# Patient Record
Sex: Female | Born: 1960 | Race: White | Hispanic: No | Smoking: Never smoker
Health system: Southern US, Community
[De-identification: ages and names within clinical notes are randomized; demographics above are authoritative.]

## PROBLEM LIST (undated history)

## (undated) DIAGNOSIS — K219 Gastro-esophageal reflux disease without esophagitis: Secondary | ICD-10-CM

## (undated) DIAGNOSIS — G8929 Other chronic pain: Secondary | ICD-10-CM

## (undated) DIAGNOSIS — G709 Myoneural disorder, unspecified: Secondary | ICD-10-CM

## (undated) DIAGNOSIS — G4700 Insomnia, unspecified: Secondary | ICD-10-CM

## (undated) DIAGNOSIS — F319 Bipolar disorder, unspecified: Secondary | ICD-10-CM

## (undated) DIAGNOSIS — F32A Depression, unspecified: Secondary | ICD-10-CM

## (undated) DIAGNOSIS — Z79890 Hormone replacement therapy (postmenopausal): Secondary | ICD-10-CM

## (undated) DIAGNOSIS — G43909 Migraine, unspecified, not intractable, without status migrainosus: Secondary | ICD-10-CM

## (undated) DIAGNOSIS — T7840XA Allergy, unspecified, initial encounter: Secondary | ICD-10-CM

## (undated) DIAGNOSIS — M545 Low back pain: Secondary | ICD-10-CM

## (undated) DIAGNOSIS — F419 Anxiety disorder, unspecified: Secondary | ICD-10-CM

## (undated) DIAGNOSIS — M1990 Unspecified osteoarthritis, unspecified site: Principal | ICD-10-CM

## (undated) HISTORY — PX: JOINT REPLACEMENT: SHX530

## (undated) HISTORY — PX: VAGINAL HYSTERECTOMY: SUR661

## (undated) HISTORY — DX: Myoneural disorder, unspecified: G70.9

## (undated) HISTORY — PX: CHOLECYSTECTOMY: SHX55

## (undated) HISTORY — PX: KNEE SURGERY: SHX244

## (undated) HISTORY — DX: Insomnia, unspecified: G47.00

## (undated) HISTORY — DX: Bipolar disorder, unspecified: F31.9

## (undated) HISTORY — DX: Low back pain: M54.5

## (undated) HISTORY — DX: Unspecified osteoarthritis, unspecified site: M19.90

## (undated) HISTORY — DX: Allergy, unspecified, initial encounter: T78.40XA

## (undated) HISTORY — DX: Depression, unspecified: F32.A

## (undated) HISTORY — DX: Migraine, unspecified, not intractable, without status migrainosus: G43.909

## (undated) HISTORY — DX: Other chronic pain: G89.29

## (undated) HISTORY — DX: Hormone replacement therapy: Z79.890

## (undated) HISTORY — DX: Gastro-esophageal reflux disease without esophagitis: K21.9

## (undated) HISTORY — DX: Anxiety disorder, unspecified: F41.9

---

## 2005-02-22 HISTORY — PX: COLONOSCOPY: SHX174

## 2011-07-05 DIAGNOSIS — M545 Low back pain, unspecified: Secondary | ICD-10-CM

## 2011-07-05 DIAGNOSIS — G8929 Other chronic pain: Secondary | ICD-10-CM

## 2011-07-05 HISTORY — DX: Other chronic pain: G89.29

## 2011-07-05 HISTORY — DX: Low back pain, unspecified: M54.50

## 2012-06-12 DIAGNOSIS — N951 Menopausal and female climacteric states: Secondary | ICD-10-CM | POA: Insufficient documentation

## 2012-09-19 DIAGNOSIS — G43909 Migraine, unspecified, not intractable, without status migrainosus: Secondary | ICD-10-CM

## 2012-09-19 HISTORY — DX: Migraine, unspecified, not intractable, without status migrainosus: G43.909

## 2013-03-05 DIAGNOSIS — G4700 Insomnia, unspecified: Secondary | ICD-10-CM

## 2013-03-05 HISTORY — DX: Insomnia, unspecified: G47.00

## 2014-09-23 DIAGNOSIS — M1812 Unilateral primary osteoarthritis of first carpometacarpal joint, left hand: Secondary | ICD-10-CM | POA: Insufficient documentation

## 2014-09-23 DIAGNOSIS — M1811 Unilateral primary osteoarthritis of first carpometacarpal joint, right hand: Secondary | ICD-10-CM | POA: Insufficient documentation

## 2015-07-12 DIAGNOSIS — M1711 Unilateral primary osteoarthritis, right knee: Secondary | ICD-10-CM | POA: Insufficient documentation

## 2015-08-20 DIAGNOSIS — Z79890 Hormone replacement therapy (postmenopausal): Secondary | ICD-10-CM | POA: Insufficient documentation

## 2015-08-20 DIAGNOSIS — J301 Allergic rhinitis due to pollen: Secondary | ICD-10-CM | POA: Insufficient documentation

## 2015-08-20 DIAGNOSIS — M2550 Pain in unspecified joint: Secondary | ICD-10-CM | POA: Insufficient documentation

## 2015-08-20 DIAGNOSIS — K219 Gastro-esophageal reflux disease without esophagitis: Secondary | ICD-10-CM

## 2015-08-20 DIAGNOSIS — F319 Bipolar disorder, unspecified: Secondary | ICD-10-CM | POA: Insufficient documentation

## 2015-08-20 HISTORY — DX: Hormone replacement therapy: Z79.890

## 2015-08-20 HISTORY — DX: Gastro-esophageal reflux disease without esophagitis: K21.9

## 2015-08-20 HISTORY — DX: Bipolar disorder, unspecified: F31.9

## 2015-10-29 DIAGNOSIS — S83249A Other tear of medial meniscus, current injury, unspecified knee, initial encounter: Secondary | ICD-10-CM | POA: Insufficient documentation

## 2015-11-11 DIAGNOSIS — Z9889 Other specified postprocedural states: Secondary | ICD-10-CM | POA: Insufficient documentation

## 2016-08-16 DIAGNOSIS — M722 Plantar fascial fibromatosis: Secondary | ICD-10-CM | POA: Insufficient documentation

## 2017-03-08 ENCOUNTER — Encounter: Payer: Self-pay | Admitting: Osteopathic Medicine

## 2017-03-08 ENCOUNTER — Encounter (INDEPENDENT_AMBULATORY_CARE_PROVIDER_SITE_OTHER): Payer: Self-pay

## 2017-03-08 ENCOUNTER — Ambulatory Visit (INDEPENDENT_AMBULATORY_CARE_PROVIDER_SITE_OTHER): Payer: Medicare HMO | Admitting: Osteopathic Medicine

## 2017-03-08 VITALS — BP 137/86 | HR 65 | Temp 98.1°F | Ht 64.0 in | Wt 179.0 lb

## 2017-03-08 DIAGNOSIS — F5101 Primary insomnia: Secondary | ICD-10-CM | POA: Diagnosis not present

## 2017-03-08 DIAGNOSIS — G8929 Other chronic pain: Secondary | ICD-10-CM

## 2017-03-08 DIAGNOSIS — Z79890 Hormone replacement therapy (postmenopausal): Secondary | ICD-10-CM | POA: Diagnosis not present

## 2017-03-08 DIAGNOSIS — M545 Low back pain: Secondary | ICD-10-CM | POA: Diagnosis not present

## 2017-03-08 DIAGNOSIS — Z1231 Encounter for screening mammogram for malignant neoplasm of breast: Secondary | ICD-10-CM | POA: Diagnosis not present

## 2017-03-08 DIAGNOSIS — N951 Menopausal and female climacteric states: Secondary | ICD-10-CM | POA: Diagnosis not present

## 2017-03-08 DIAGNOSIS — M1990 Unspecified osteoarthritis, unspecified site: Secondary | ICD-10-CM | POA: Diagnosis not present

## 2017-03-08 DIAGNOSIS — K219 Gastro-esophageal reflux disease without esophagitis: Secondary | ICD-10-CM | POA: Diagnosis not present

## 2017-03-08 DIAGNOSIS — Z780 Asymptomatic menopausal state: Secondary | ICD-10-CM | POA: Diagnosis not present

## 2017-03-08 DIAGNOSIS — J302 Other seasonal allergic rhinitis: Secondary | ICD-10-CM | POA: Diagnosis not present

## 2017-03-08 DIAGNOSIS — Z0000 Encounter for general adult medical examination without abnormal findings: Secondary | ICD-10-CM

## 2017-03-08 DIAGNOSIS — F319 Bipolar disorder, unspecified: Secondary | ICD-10-CM

## 2017-03-08 DIAGNOSIS — F419 Anxiety disorder, unspecified: Secondary | ICD-10-CM | POA: Diagnosis not present

## 2017-03-08 DIAGNOSIS — Z1239 Encounter for other screening for malignant neoplasm of breast: Secondary | ICD-10-CM

## 2017-03-08 HISTORY — DX: Unspecified osteoarthritis, unspecified site: M19.90

## 2017-03-08 HISTORY — DX: Anxiety disorder, unspecified: F41.9

## 2017-03-08 MED ORDER — DICLOFENAC SODIUM 1 % TD GEL: 2.0000 g | Freq: Four times a day (QID) | TRANSDERMAL | 11 refills | Status: DC

## 2017-03-08 MED ORDER — ESTRADIOL 1 MG PO TABS: 1.0000 mg | ORAL_TABLET | Freq: Every day | ORAL | 1 refills | Status: DC

## 2017-03-08 MED ORDER — CELECOXIB 200 MG PO CAPS: 200.0000 mg | ORAL_CAPSULE | Freq: Two times a day (BID) | ORAL | 3 refills | Status: DC

## 2017-03-08 NOTE — Progress Notes (Signed)
HPI: Amber Hanson is a 57 y.o. female who  has a past medical history of Anxiety (03/08/2017), Arthritis (03/08/2017), Bipolar 1 disorder (HCC) (08/20/2015), Chronic low back pain (07/05/2011), Gastroesophageal reflux disease without esophagitis (08/20/2015), Insomnia (03/05/2013), Migraine without status migrainosus, not intractable (09/19/2012), and Postmenopausal HRT (hormone replacement therapy) (08/20/2015).  she presents to [facility] today, 03/08/17,  for chief complaint of:  Chief Complaint  Patient presents with  . Establish Care - multiple issues to discuss as below    Menopausal symptoms:  has been off HRT for some time, hot flashes, would like to get back on this. That she was on estrogen/progesterone combination. She has had a hysterectomy.  Psychiatric:  Following with psychiatry for anxiety/bipolar. Reports doing well on current medication regimen as below. Most recent psychiatry visit 02/24/2017. Alfonso Patten was ordered at that visit.  Knee pain:  Celebrex was helping, then dose was reduced. Would like to get back on 400 mg per day dosing. Was previously following with orthopedics. Has been sometime since last visit  Back pain: Records reviewed from previous PCP, [facility] in [place], Dr. Jean Rosenthal. Last office visit October 2018. At that point chief complaint back pain ongoing for more than 5 years but worse over the past few weeks at that time. At that time, was endorsing periodic numbness of bilateral upper extremities. Cervical spine x-ray was performed, steroid taper added to her current anti-inflammatories. Was advised f/u in 6 weeks. Patient states the steroids were helping, she never got the x-rays done    Past medical, surgical, social and family history reviewed:  Patient Active Problem List   Diagnosis Date Noted  . Anxiety 03/08/2017  . Arthritis 03/08/2017  . Plantar fasciitis, left 08/16/2016  . S/P left knee arthroscopy  11/11/2015  . Acute medial meniscal tear 10/29/2015  . Allergic rhinitis due to pollen 08/20/2015  . Bipolar 1 disorder (HCC) 08/20/2015  . Gastroesophageal reflux disease without esophagitis 08/20/2015  . Multiple joint pain 08/20/2015  . Postmenopausal HRT (hormone replacement therapy) 08/20/2015  . Primary osteoarthritis of right knee 07/12/2015  . Primary osteoarthritis of first carpometacarpal joint of left hand 09/23/2014  . Insomnia 03/05/2013  . Migraine without status migrainosus, not intractable 09/19/2012  . Menopausal symptoms 06/12/2012  . Chronic low back pain 07/05/2011    Past Surgical History:  Procedure Laterality Date  . CESAREAN SECTION     (2)   . CHOLECYSTECTOMY    . KNEE SURGERY Bilateral   . VAGINAL HYSTERECTOMY      Social History   Tobacco Use  . Smoking status: Never Smoker  . Smokeless tobacco: Never Used  Substance Use Topics  . Alcohol use: No    Frequency: Never    Family History  Problem Relation Age of Onset  . Cancer Mother   . Depression Father   . Diabetes Father   . Depression Sister   . Depression Brother   . Stroke Other   . GER disease Other      Current medication list and allergy/intolerance information reviewed:    Current Meds  Medication Sig  . busPIRone (BUSPAR) 15 MG tablet   . celecoxib (CELEBREX) 200 MG capsule Take 1 capsule (200 mg total) by mouth 2 (two) times daily.  . cetirizine (ZYRTEC) 10 MG tablet Take by mouth.  . DULoxetine (CYMBALTA) 60 MG capsule   . Eszopiclone 3 MG TABS Take by mouth.  . Misc. Devices MISC Tri-Est SR 8:1:1 with methyltestosterone 0.625/1.25mg   .  pantoprazole (PROTONIX) 20 MG tablet Take by mouth.  . phenylephrine (SUDAFED PE) 10 MG TABS tablet   . propranolol (INDERAL) 20 MG tablet TAKE 1 TABLET TWICE DAILY  . zolpidem (AMBIEN) 10 MG tablet   . [DISCONTINUED] celecoxib (CELEBREX) 200 MG capsule   . [DISCONTINUED] estrogens-methylTEST (ESTRATEST) 1.25-2.5 MG tablet Take by mouth.     Allergies  Allergen Reactions  . Other Itching, Nausea And Vomiting and Other (See Comments)    Patient states she has taken this safely before since the one episode of itching  Anti-inflammatory - affects liver function Patient states she has taken this safely before since the one episode of itching  Anti-inflammatory - affects liver function   . Red Dye Itching    Patient states she has taken this safely before since the one episode of itching   . Sulfa Antibiotics Anaphylaxis  . Sulfasalazine Anaphylaxis and Swelling  . Tramadol Nausea And Vomiting and Other (See Comments)    Headaches Headaches Headaches   . Ibuprofen Rash    Affects liver functions      Review of Systems:  Constitutional:  No  fever, no chills, No recent illness, No unintentional weight changes. No significant fatigue.   HEENT: No  headache, no vision change, no hearing change, No sore throat, No  sinus pressure  Cardiac: No  chest pain, No  pressure, No palpitations, No  Orthopnea  Respiratory:  No  shortness of breath. No  Cough  Gastrointestinal: No  abdominal pain, No  nausea, No  vomiting,  No  blood in stool, No  diarrhea, No  constipation   Musculoskeletal: No new myalgia/arthralgia  Genitourinary: No  incontinence, No  abnormal genital bleeding, No abnormal genital discharge  Skin: No  Rash, No other wounds/concerning lesions  Hem/Onc: No  easy bruising/bleeding, No  abnormal lymph node  Endocrine: No cold intolerance,  No heat intolerance. No polyuria/polydipsia/polyphagia   Neurologic: No  weakness, No  dizziness, No  slurred speech/focal weakness/facial droop  Psychiatric: No  concerns with depression, No  concerns with anxiety, No sleep problems, No mood problems  Exam:  BP 137/86   Pulse 65   Temp 98.1 F (36.7 C) (Oral)   Ht 5\' 4"  (1.626 m)   Wt 179 lb (81.2 kg)   LMP  (LMP Unknown)   BMI 30.73 kg/m   Constitutional: VS see above. General Appearance: alert,  well-developed, well-nourished, NAD  Eyes: Normal lids and conjunctive, non-icteric sclera  Ears, Nose, Mouth, Throat: MMM, Normal external inspection ears/nares/mouth/lips/gums. TM normal bilaterally. Pharynx/tonsils no erythema, no exudate. Nasal mucosa normal.   Neck: No masses, trachea midline. No thyroid enlargement. No tenderness/mass appreciated. No lymphadenopathy  Respiratory: Normal respiratory effort. no wheeze, no rhonchi, no rales  Cardiovascular: S1/S2 normal, no murmur, no rub/gallop auscultated. RRR. No lower extremity edema. Pedal pulse II/IV bilaterally DP and PT. No carotid bruit or JVD. No abdominal aortic bruit.  Gastrointestinal: Nontender, no masses. No hepatomegaly, no splenomegaly. No hernia appreciated. Bowel sounds normal. Rectal exam deferred.   Musculoskeletal: Gait normal. No clubbing/cyanosis of digits.   Neurological: Normal balance/coordination. No tremor. No cranial nerve deficit on limited exam. Motor and sensation intact and symmetric. Cerebellar reflexes intact.   Skin: warm, dry, intact. No rash/ulcer. No concerning nevi or subq nodules on limited exam.    Psychiatric: Normal judgment/insight. Normal mood and affect. Oriented x3.    No results found for this or any previous visit (from the past 72 hour(s)).  No results found.  ASSESSMENT/PLAN:   Arthritis - Plan: diclofenac sodium (VOLTAREN) 1 % GEL, celecoxib (CELEBREX) 200 MG capsule  Postmenopausal HRT (hormone replacement therapy) - Plan: estradiol (ESTRACE) 1 MG tablet  Postmenopausal - Plan: estradiol (ESTRACE) 1 MG tablet, VITAMIN D 25 Hydroxy (Vit-D Deficiency, Fractures)  Breast cancer screening - Plan: MM DIGITAL SCREENING BILATERAL  Anxiety - Plan: busPIRone (BUSPAR) 15 MG tablet, DULoxetine (CYMBALTA) 60 MG capsule, propranolol (INDERAL) 20 MG tablet  Bipolar 1 disorder (HCC) - Plan: busPIRone (BUSPAR) 15 MG tablet, DULoxetine (CYMBALTA) 60 MG capsule  Chronic bilateral low  back pain, with sciatica presence unspecified - Plan: diclofenac sodium (VOLTAREN) 1 % GEL, celecoxib (CELEBREX) 200 MG capsule  Menopausal symptoms - Plan: estradiol (ESTRACE) 1 MG tablet  Seasonal allergies - Plan: cetirizine (ZYRTEC) 10 MG tablet, phenylephrine (SUDAFED PE) 10 MG TABS tablet  Primary insomnia - Plan: Eszopiclone 3 MG TABS, DISCONTINUED: zolpidem (AMBIEN) 10 MG tablet  Gastroesophageal reflux disease, esophagitis presence not specified - Plan: pantoprazole (PROTONIX) 20 MG tablet  Annual physical exam - not performed/billed todya, labs for future visit  - Plan: CBC, COMPLETE METABOLIC PANEL WITH GFR, Lipid panel, VITAMIN D 25 Hydroxy (Vit-D Deficiency, Fractures)    Patient Instructions  Hot flashes   Discuss with psychiatrist: Venlafaxine aka Effexor for hot flashes. This may be a good option for you to avoid long-term hormone treatment.   In the meantime, will trial getting back on estrogen and see how this does for the hot flashes.  Knee pain/back pain  High-dose Celebrex certainly puts you at higher risk for cardiac issues, if this is a risk you're willing to except for the benefit of control of knee pain, that is okay as long as you are aware. Can also try the Voltaren gel, see printed prescription with attached coupon. It is cheapest at [facility].   If knee is not better or if it gets worse, I would recommend follow-up with one of our sports medicine specialists (Dr Denyse Amassorey or Dr. Cherylann Parrhekkekandam aka Dr. Karie Schwalbe) for further evaluation in 2-4 weeks. Just call our office and ask for an appointment for sports medicine!   Orders printed for you to get lab work done at your convenience. Lab is downstairs on the first floor of this building, you do not need an appointment. Please come fasting as in no food or anything to drink other than water or black coffee for at least 8 hours.       Visit summary with medication list and pertinent instructions was printed for patient to  review. All questions at time of visit were answered - patient instructed to contact office with any additional concerns. ER/RTC precautions were reviewed with the patient.   Follow-up plan: Return in about 3 months (around 06/06/2017) for Annual Medicare wellness visit, sooner if needed.  Note: Total time spent 45 minutes, greater than 50% of the visit was spent face-to-face counseling and coordinating care for the following: The primary encounter diagnosis was Arthritis. Diagnoses of Postmenopausal HRT (hormone replacement therapy), Postmenopausal, Breast cancer screening, Anxiety, Bipolar 1 disorder (HCC), Chronic bilateral low back pain, with sciatica presence unspecified, Menopausal symptoms, Seasonal allergies, Primary insomnia, Gastroesophageal reflux disease, esophagitis presence not specified, and Annual physical exam were also pertinent to this visit.Marland Kitchen.  Please note: voice recognition software was used to produce this document, and typos may escape review. Please contact Dr. Lyn HollingsheadAlexander for any needed clarifications.

## 2017-03-08 NOTE — Patient Instructions (Addendum)
Hot flashes   Discuss with psychiatrist: Venlafaxine aka Effexor for hot flashes. This may be a good option for you to avoid long-term hormone treatment.   In the meantime, will trial getting back on estrogen and see how this does for the hot flashes.  Knee pain/back pain  High-dose Celebrex certainly puts you at higher risk for cardiac issues, if this is a risk you're willing to except for the benefit of control of knee pain, that is okay as long as you are aware. Can also try the Voltaren gel, see printed prescription with attached coupon. [facility] is [facility].   If knee is not better or if it gets worse, I would recommend follow-up with one of our sports medicine specialists (Dr Denyse Amassorey or Dr. Cherylann Parrhekkekandam aka Dr. Karie Schwalbe) for further evaluation in 2-4 weeks. Just call our office and ask for an appointment for sports medicine!   Orders printed for you to get lab work done at your convenience. Lab is downstairs on the first floor of this building, you do not need an appointment. Please come fasting as in no food or anything to drink other than water or black coffee for at least 8 hours.

## 2017-04-04 ENCOUNTER — Ambulatory Visit (INDEPENDENT_AMBULATORY_CARE_PROVIDER_SITE_OTHER): Payer: Medicare HMO | Admitting: Sports Medicine

## 2017-04-04 ENCOUNTER — Encounter: Payer: Self-pay | Admitting: Sports Medicine

## 2017-04-04 ENCOUNTER — Ambulatory Visit (INDEPENDENT_AMBULATORY_CARE_PROVIDER_SITE_OTHER): Payer: Medicare HMO

## 2017-04-04 DIAGNOSIS — M50321 Other cervical disc degeneration at C4-C5 level: Secondary | ICD-10-CM | POA: Diagnosis not present

## 2017-04-04 DIAGNOSIS — Z96611 Presence of right artificial shoulder joint: Secondary | ICD-10-CM | POA: Insufficient documentation

## 2017-04-04 DIAGNOSIS — M7551 Bursitis of right shoulder: Secondary | ICD-10-CM | POA: Diagnosis not present

## 2017-04-04 DIAGNOSIS — M5412 Radiculopathy, cervical region: Secondary | ICD-10-CM

## 2017-04-04 DIAGNOSIS — M4312 Spondylolisthesis, cervical region: Secondary | ICD-10-CM | POA: Diagnosis not present

## 2017-04-04 DIAGNOSIS — M19011 Primary osteoarthritis, right shoulder: Secondary | ICD-10-CM | POA: Insufficient documentation

## 2017-04-04 DIAGNOSIS — M25511 Pain in right shoulder: Secondary | ICD-10-CM | POA: Diagnosis not present

## 2017-04-04 MED ORDER — CYCLOBENZAPRINE HCL 10 MG PO TABS: ORAL_TABLET | ORAL | 0 refills | Status: DC

## 2017-04-04 MED ORDER — PREDNISONE 50 MG PO TABS: ORAL_TABLET | ORAL | 0 refills | Status: DC

## 2017-04-04 NOTE — Assessment & Plan Note (Signed)
X-rays, injection, subacromial. Physical therapy. Return to see me in 1 month.

## 2017-04-04 NOTE — Progress Notes (Signed)
Subjective:    I'm seeing this patient as a consultation for: Dr. Sunnie Nielsen  CC: Right shoulder pain  HPI: Amber Hanson is a pleasant 57 year old female, she was painting the wall, with a roller.  Over the next day developed severe pain over her deltoid, radiation to the elbow, and pain to the point where she could not sleep, and could not abduct her shoulder.  In addition she is developed worsening neck pain with radiation to the hands and fingertips with numbness and tingling.  No bowel or bladder dysfunction, saddle numbness, constitutional symptoms, trauma.  No progressive weakness.  I reviewed the past medical history, family history, social history, surgical history, and allergies today and no changes were needed.  Please see the problem list section below in epic for further details.  Past Medical History: Past Medical History:  Diagnosis Date  . Anxiety 03/08/2017  . Arthritis 03/08/2017  . Bipolar 1 disorder (HCC) 08/20/2015   Overview:  Dr. Chilton Si, [facility]. Now Dr Jordan Likes   Last Assessment & Plan:  Relevant Hx: Course: Daily Update: Today's Plan:  . Chronic low back pain 07/05/2011   Overview:  Dr. Karie Schwalbe. [facility]  . Gastroesophageal reflux disease without esophagitis 08/20/2015  . Insomnia 03/05/2013  . Migraine without status migrainosus, not intractable 09/19/2012  . Postmenopausal HRT (hormone replacement therapy) 08/20/2015   Past Surgical History: Past Surgical History:  Procedure Laterality Date  . CESAREAN SECTION     (2)   . CHOLECYSTECTOMY    . KNEE SURGERY Bilateral   . VAGINAL HYSTERECTOMY     Social History: Social History   Socioeconomic History  . Marital status: Married    Spouse name: None  . Number of children: 2  . Years of education: None  . Highest education level: Associate degree: academic program  Social Needs  . Financial resource strain: None  . Food insecurity - worry: None  . Food insecurity - inability: None  .  Transportation needs - medical: None  . Transportation needs - non-medical: None  Occupational History  . None  Tobacco Use  . Smoking status: Never Smoker  . Smokeless tobacco: Never Used  Substance and Sexual Activity  . Alcohol use: No    Frequency: Never  . Drug use: No  . Sexual activity: Yes    Partners: Male    Birth control/protection: None  Other Topics Concern  . None  Social History Narrative  . None   Family History: Family History  Problem Relation Age of Onset  . Cancer Mother   . Depression Father   . Diabetes Father   . Depression Sister   . Depression Brother   . Stroke Other   . GER disease Other    Allergies: Allergies  Allergen Reactions  . Other Itching, Nausea And Vomiting and Other (See Comments)    Patient states she has taken this safely before since the one episode of itching  Anti-inflammatory - affects liver function Patient states she has taken this safely before since the one episode of itching  Anti-inflammatory - affects liver function   . Red Dye Itching    Patient states she has taken this safely before since the one episode of itching   . Sulfa Antibiotics Anaphylaxis  . Sulfasalazine Anaphylaxis and Swelling  . Tramadol Nausea And Vomiting and Other (See Comments)    Headaches Headaches Headaches   . Ibuprofen Rash    Affects liver functions   Medications: See med rec.  Review  of Systems: No headache, visual changes, nausea, vomiting, diarrhea, constipation, dizziness, abdominal pain, skin rash, fevers, chills, night sweats, weight loss, swollen lymph nodes, body aches, joint swelling, muscle aches, chest pain, shortness of breath, mood changes, visual or auditory hallucinations.   Objective:   General: Well Developed, well nourished, and in no acute distress.  Neuro:  Extra-ocular muscles intact, able to move all 4 extremities, sensation grossly intact.  Deep tendon reflexes tested were normal. Psych: Alert and oriented,  mood congruent with affect. ENT:  Ears and nose appear unremarkable.  Hearing grossly normal. Neck: Unremarkable overall appearance, trachea midline.  No visible thyroid enlargement. Eyes: Conjunctivae and lids appear unremarkable.  Pupils equal and round. Skin: Warm and dry, no rashes noted.  Cardiovascular: Pulses palpable, no extremity edema. Right shoulder: Inspection reveals no abnormalities, atrophy or asymmetry. Palpation is normal with no tenderness over AC joint or bicipital groove. ROM is full in all planes. Rotator cuff strength normal throughout. Positive Neer and Hawkin's tests, empty can. Speeds and Yergason's tests normal. No labral pathology noted with negative Obrien's, negative crank, negative clunk, and good stability. Normal scapular function observed. No painful arc and no drop arm sign. No apprehension sign Neck: Negative spurling's Full neck range of motion Grip strength and sensation normal in bilateral hands Strength good C4 to T1 distribution No sensory change to C4 to T1 Reflexes normal  Procedure: Real-time Ultrasound Guided Injection of right subacromial bursa Device: GE Logiq E  Verbal informed consent obtained.  Time-out conducted.  Noted no overlying erythema, induration, or other signs of local infection.  Skin prepped in a sterile fashion.  Local anesthesia: Topical Ethyl chloride.  With sterile technique and under real time ultrasound guidance:   1 cc: 40, 1 cc lidocaine, 1 cc bupivacaine injected easily Completed without difficulty  Pain immediately resolved suggesting accurate placement of the medication.  Advised to call if fevers/chills, erythema, induration, drainage, or persistent bleeding.  Images permanently stored and available for review in the ultrasound unit.  Impression: Technically successful ultrasound guided injection.  Impression and Recommendations:   This case required medical decision making of moderate  complexity.  Right cervical radiculopathy C7 distribution, known C6-C7 degenerative disc disease on MRI from 2011. X-rays, Flexeril at bedtime, formal PT. Return in 1 month, interventional planning with an MRI if she is no better.  Acute shoulder bursitis, right X-rays, injection, subacromial. Physical therapy. Return to see me in 1 month. ___________________________________________ Ihor Austinhomas J. Benjamin Stainhekkekandam, M.D., ABFM., CAQSM. Primary Care and Sports Medicine [facility]  Adjunct Instructor of Family Medicine  [employer]

## 2017-04-04 NOTE — Assessment & Plan Note (Signed)
C7 distribution, known C6-C7 degenerative disc disease on MRI from 2011. X-rays, Flexeril at bedtime, formal PT. Return in 1 month, interventional planning with an MRI if she is no better.

## 2017-04-25 ENCOUNTER — Encounter: Payer: Self-pay | Admitting: Physician Assistant

## 2017-04-25 ENCOUNTER — Ambulatory Visit (INDEPENDENT_AMBULATORY_CARE_PROVIDER_SITE_OTHER): Payer: Medicare HMO | Admitting: Physician Assistant

## 2017-04-25 VITALS — BP 124/85 | HR 63 | Temp 98.1°F | Wt 183.0 lb

## 2017-04-25 DIAGNOSIS — K219 Gastro-esophageal reflux disease without esophagitis: Secondary | ICD-10-CM

## 2017-04-25 DIAGNOSIS — R300 Dysuria: Secondary | ICD-10-CM | POA: Diagnosis not present

## 2017-04-25 DIAGNOSIS — N390 Urinary tract infection, site not specified: Secondary | ICD-10-CM | POA: Diagnosis not present

## 2017-04-25 DIAGNOSIS — Z90710 Acquired absence of both cervix and uterus: Secondary | ICD-10-CM | POA: Insufficient documentation

## 2017-04-25 DIAGNOSIS — Z79890 Hormone replacement therapy (postmenopausal): Secondary | ICD-10-CM

## 2017-04-25 MED ORDER — NITROFURANTOIN MONOHYD MACRO 100 MG PO CAPS: 100.0000 mg | ORAL_CAPSULE | Freq: Two times a day (BID) | ORAL | 0 refills | Status: AC

## 2017-04-25 MED ORDER — ESTROGENS, CONJUGATED 0.625 MG/GM VA CREA: 1.0000 | TOPICAL_CREAM | Freq: Every day | VAGINAL | 5 refills | Status: DC

## 2017-04-25 MED ORDER — PANTOPRAZOLE SODIUM 20 MG PO TBEC: 20.0000 mg | DELAYED_RELEASE_TABLET | Freq: Every day | ORAL | 0 refills | Status: DC

## 2017-04-25 NOTE — Progress Notes (Signed)
HPI:                                                                Amber Hanson is a 57 y.o. female who presents to [facility]: Primary Care Sports Medicine today for dysuria  Dysuria   This is a new problem. The current episode started 1 to 4 weeks ago. The problem occurs every urination. The problem has been unchanged. The quality of the pain is described as burning. The pain is moderate. There has been no fever. She is sexually active. There is no history of pyelonephritis. Associated symptoms include frequency and urgency. Pertinent negatives include no chills, flank pain, nausea, possible pregnancy or vomiting. Associated symptoms comments: +suprapubic pressure. Treatments tried: Azo. The treatment provided mild relief. Her past medical history is significant for recurrent UTIs (3-4 per year).      Depression screen PHQ 2/9 03/08/2017  Decreased Interest 2  Down, Depressed, Hopeless 1  PHQ - 2 Score 3  Altered sleeping 2  Tired, decreased energy 1  Change in appetite 1  Feeling bad or failure about yourself  1  Trouble concentrating 1  Moving slowly or fidgety/restless 0  Suicidal thoughts 0  PHQ-9 Score 9    No flowsheet data found.    Past Medical History:  Diagnosis Date  . Anxiety 03/08/2017  . Arthritis 03/08/2017  . Bipolar 1 disorder (HCC) 08/20/2015   Overview:  Dr. Chilton Si, [facility]. Now Dr Jordan Likes   Last Assessment & Plan:  Relevant Hx: Course: Daily Update: Today's Plan:  . Chronic low back pain 07/05/2011   Overview:  Dr. Karie Schwalbe. [facility]  . Gastroesophageal reflux disease without esophagitis 08/20/2015  . Insomnia 03/05/2013  . Migraine without status migrainosus, not intractable 09/19/2012  . Postmenopausal HRT (hormone replacement therapy) 08/20/2015   Past Surgical History:  Procedure Laterality Date  . CESAREAN SECTION     (2)   . CHOLECYSTECTOMY    . KNEE SURGERY Bilateral   . VAGINAL HYSTERECTOMY     Social History    Tobacco Use  . Smoking status: Never Smoker  . Smokeless tobacco: Never Used  Substance Use Topics  . Alcohol use: No    Frequency: Never   family history includes Cancer in her mother; Depression in her brother, father, and sister; Diabetes in her father; GER disease in her other; Stroke in her other.    ROS: negative except as noted in the HPI  Medications: Current Outpatient Medications  Medication Sig Dispense Refill  . Acetaminophen-Caffeine 500-65 MG TABS Take by mouth.    . busPIRone (BUSPAR) 15 MG tablet     . celecoxib (CELEBREX) 200 MG capsule Take 1 capsule (200 mg total) by mouth 2 (two) times daily. 180 capsule 3  . cetirizine (ZYRTEC) 10 MG tablet Take by mouth.    . cyclobenzaprine (FLEXERIL) 10 MG tablet One half tab PO qHS, then increase gradually to one tab TID. 30 tablet 0  . diclofenac sodium (VOLTAREN) 1 % GEL Apply 2-4 g topically 4 (four) times daily. To affected joint. 100 g 11  . DULoxetine (CYMBALTA) 60 MG capsule     . estradiol (ESTRACE) 1 MG tablet Take 1 tablet (1 mg total) by mouth daily. 30 tablet 1  .  Eszopiclone 3 MG TABS Take by mouth.    . Misc. Devices MISC Tri-Est SR 8:1:1 with methyltestosterone 0.625/1.25mg     . pantoprazole (PROTONIX) 20 MG tablet Take 1 tablet (20 mg total) by mouth daily. 90 tablet 0  . propranolol (INDERAL) 20 MG tablet TAKE 1 TABLET TWICE DAILY    . QUEtiapine (SEROQUEL) 50 MG tablet Take by mouth.    . conjugated estrogens (PREMARIN) vaginal cream Place 1 Applicatorful vaginally at bedtime. Insert 0.5 g  Intravaginally nightly for 2 weeks, then twice weekly 30 g 5  . nitrofurantoin, macrocrystal-monohydrate, (MACROBID) 100 MG capsule Take 1 capsule (100 mg total) by mouth 2 (two) times daily for 5 days. 10 capsule 0   No current facility-administered medications for this visit.    Allergies  Allergen Reactions  . Sulfa Antibiotics Anaphylaxis  . Ibuprofen Other (See Comments)    Affects liver functions  . Red  Dye Itching    Patient states she has taken this safely before since the one episode of itching   . Tramadol Nausea And Vomiting and Other (See Comments)    Headaches        Objective:  BP 124/85   Pulse 63   Temp 98.1 F (36.7 C) (Oral)   Wt 183 lb (83 kg)   LMP  (LMP Unknown)   BMI 31.41 kg/m  Gen:  alert, not ill-appearing, no distress, appropriate for age, obese female HEENT: head normocephalic without obvious abnormality, conjunctiva and cornea clear, trachea midline Pulm: Normal work of breathing, normal phonation GI: abdomen soft, suprapubic tenderness, no rebound, guarding or rigidity, no CVA tenderness  Neuro: alert and oriented x 3, no tremor MSK: extremities atraumatic, normal gait and station Skin: intact, no rashes on exposed skin, no jaundice, no cyanosis   No results found for this or any previous visit (from the past 72 hour(s)). No results found.    Assessment and Plan: 57 y.o. female with   1. Dysuria - unable to perform POCT UA due to Azo. Treating empirically for uncomplicated cystitis with Macrobid - Urinalysis, microscopic only pending - Urine Culture pending - nitrofurantoin, macrocrystal-monohydrate, (MACROBID) 100 MG capsule; Take 1 capsule (100 mg total) by mouth 2 (two) times daily for 5 days.  Dispense: 10 capsule; Refill: 0  2. Postmenopausal hormone replacement therapy - patient reports she used to take Premarin, but stopped due to cost. She is getting 3-4 UTI's per year. She was recently started on oral estradiol by PCP about 8 weeks ago. She is s/p hysterectomy for benign disease.  - continue Estradiol 21 days on, 7 days off. Adding Premarin twice weekly - conjugated estrogens (PREMARIN) vaginal cream; Place 1 Applicatorful vaginally at bedtime. Insert 0.5 g  Intravaginally nightly for 2 weeks, then twice weekly  Dispense: 30 g; Refill: 5  3. Recurrent UTI - conjugated estrogens (PREMARIN) vaginal cream; Place 1 Applicatorful vaginally  at bedtime. Insert 0.5 g  Intravaginally nightly for 2 weeks, then twice weekly  Dispense: 30 g; Refill: 5  4. Gastroesophageal reflux disease, esophagitis presence not specified - GERD diet - discussed that in absence of PUD/esophagitis, 40 mg dose is not necessary. If sx are not controlled with 20 mg, recommend follow-up with GI for possible EGD - pantoprazole (PROTONIX) 20 MG tablet; Take 1 tablet (20 mg total) by mouth daily.  Dispense: 90 tablet; Refill: 0  5. H/O hysterectomy for benign disease   Patient education and anticipatory guidance given Patient agrees with treatment plan Follow-up in 4  weeks w/PCP for HRT and GERD or sooner as needed if symptoms worsen or fail to improve  Levonne Hubertharley E. Abdallah Hern PA-C

## 2017-04-25 NOTE — Patient Instructions (Addendum)
Urinary Tract Infection, Adult A urinary tract infection (UTI) is an infection of any part of the urinary tract. The urinary tract includes the:  Kidneys.  Ureters.  Bladder.  Urethra.  These organs make, store, and get rid of pee (urine) in the body. Follow these instructions at home:  Take over-the-counter and prescription medicines only as told by your doctor.  If you were prescribed an antibiotic medicine, take it as told by your doctor. Do not stop taking the antibiotic even if you start to feel better.  Avoid the following drinks: ? Alcohol. ? Caffeine. ? Tea. ? Carbonated drinks.  Drink enough fluid to keep your pee clear or pale yellow.  Keep all follow-up visits as told by your doctor. This is important.  Make sure to: ? Empty your bladder often and completely. Do not to hold pee for long periods of time. ? Empty your bladder before and after sex. ? Wipe from front to back after a bowel movement if you are female. Use each tissue one time when you wipe. Contact a doctor if:  You have back pain.  You have a fever.  You feel sick to your stomach (nauseous).  You throw up (vomit).  Your symptoms do not get better after 3 days.  Your symptoms go away and then come back. Get help right away if:  You have very bad back pain.  You have very bad lower belly (abdominal) pain.  You are throwing up and cannot keep down any medicines or water. This information is not intended to replace advice given to you by your health care provider. Make sure you discuss any questions you have with your health care provider. Document Released: 07/28/2007 Document Revised: 07/17/2015 Document Reviewed: 12/30/2014 Elsevier Interactive Patient Education  2018 [employer]   Food Choices for Gastroesophageal Reflux Disease, Adult When you have gastroesophageal reflux disease (GERD), the foods you eat and your eating habits are very important. Choosing the right foods can help  ease the discomfort of GERD. Consider working with a diet and nutrition specialist (dietitian) to help you make healthy food choices. What general guidelines should I follow? Eating plan  Choose healthy foods low in fat, such as fruits, vegetables, whole grains, low-fat dairy products, and lean meat, fish, and poultry.  Eat frequent, small meals instead of three large meals each day. Eat your meals slowly, in a relaxed setting. Avoid bending over or lying down until 2-3 hours after eating.  Limit high-fat foods such as fatty meats or fried foods.  Limit your intake of oils, butter, and shortening to less than 8 teaspoons each day.  Avoid the following: ? Foods that cause symptoms. These may be different for different people. Keep a food diary to keep track of foods that cause symptoms. ? Alcohol. ? Drinking large amounts of liquid with meals. ? Eating meals during the 2-3 hours before bed.  Cook foods using methods other than frying. This may include baking, grilling, or broiling. Lifestyle   Maintain a healthy weight. Ask your health care provider what weight is healthy for you. If you need to lose weight, work with your health care provider to do so safely.  Exercise for at least 30 minutes on 5 or more days each week, or as told by your health care provider.  Avoid wearing clothes that fit tightly around your waist and chest.  Do not use any products that contain nicotine or tobacco, such as cigarettes and e-cigarettes. If you need help  quitting, ask your health care provider.  Sleep with the head of your bed raised. Use a wedge under the mattress or blocks under the bed frame to raise the head of the bed. What foods are not recommended? The items listed may not be a complete list. Talk with your dietitian about what dietary choices are best for you. Grains Pastries or quick breads with added fat. [place] toast. Vegetables Deep fried vegetables. [place] fries. Any vegetables  prepared with added fat. Any vegetables that cause symptoms. For some people this may include tomatoes and tomato products, chili peppers, onions and garlic, and horseradish. Fruits Any fruits prepared with added fat. Any fruits that cause symptoms. For some people this may include citrus fruits, such as oranges, grapefruit, pineapple, and lemons. Meats and other protein foods High-fat meats, such as fatty beef or pork, hot dogs, ribs, ham, sausage, salami and bacon. Fried meat or protein, including fried fish and fried chicken. Nuts and nut butters. Dairy Whole milk and chocolate milk. Sour cream. Cream. Ice cream. Cream cheese. Milk shakes. Beverages Coffee and tea, with or without caffeine. Carbonated beverages. Sodas. Energy drinks. Fruit juice made with acidic fruits (such as orange or grapefruit). Tomato juice. Alcoholic drinks. Fats and oils Butter. Margarine. Shortening. Ghee. Sweets and desserts Chocolate and cocoa. Donuts. Seasoning and other foods Pepper. Peppermint and spearmint. Any condiments, herbs, or seasonings that cause symptoms. For some people, this may include curry, hot sauce, or vinegar-based salad dressings. Summary  When you have gastroesophageal reflux disease (GERD), food and lifestyle choices are very important to help ease the discomfort of GERD.  Eat frequent, small meals instead of three large meals each day. Eat your meals slowly, in a relaxed setting. Avoid bending over or lying down until 2-3 hours after eating.  Limit high-fat foods such as fatty meat or fried foods. This information is not intended to replace advice given to you by your health care provider. Make sure you discuss any questions you have with your health care provider. Document Released: 02/08/2005 Document Revised: 02/10/2016 Document Reviewed: 02/10/2016 Elsevier Interactive Patient Education  [employer].

## 2017-04-26 LAB — URINALYSIS, MICROSCOPIC ONLY: Hyaline Cast: NONE SEEN /LPF

## 2017-04-27 LAB — URINE CULTURE
MICRO NUMBER:: 90276156
SPECIMEN QUALITY: ADEQUATE

## 2017-04-27 NOTE — Progress Notes (Signed)
Urine culture was positive Antibiotic she was given should work Follow-up if symptoms do not improve after completion of therapy

## 2017-05-02 ENCOUNTER — Ambulatory Visit: Payer: Medicare HMO | Admitting: Sports Medicine

## 2017-05-09 ENCOUNTER — Other Ambulatory Visit: Payer: Self-pay | Admitting: Osteopathic Medicine

## 2017-05-09 DIAGNOSIS — Z79890 Hormone replacement therapy (postmenopausal): Secondary | ICD-10-CM

## 2017-05-09 DIAGNOSIS — N951 Menopausal and female climacteric states: Secondary | ICD-10-CM

## 2017-05-09 DIAGNOSIS — Z780 Asymptomatic menopausal state: Secondary | ICD-10-CM

## 2017-06-08 ENCOUNTER — Encounter: Payer: Medicare HMO | Admitting: Osteopathic Medicine

## 2017-06-08 DIAGNOSIS — Z0189 Encounter for other specified special examinations: Secondary | ICD-10-CM

## 2017-06-09 ENCOUNTER — Ambulatory Visit (INDEPENDENT_AMBULATORY_CARE_PROVIDER_SITE_OTHER): Payer: Medicare HMO | Admitting: Sports Medicine

## 2017-06-09 ENCOUNTER — Encounter: Payer: Self-pay | Admitting: Sports Medicine

## 2017-06-09 DIAGNOSIS — M7062 Trochanteric bursitis, left hip: Secondary | ICD-10-CM | POA: Diagnosis not present

## 2017-06-09 DIAGNOSIS — M25511 Pain in right shoulder: Secondary | ICD-10-CM

## 2017-06-09 DIAGNOSIS — M5412 Radiculopathy, cervical region: Secondary | ICD-10-CM | POA: Diagnosis not present

## 2017-06-09 DIAGNOSIS — M7551 Bursitis of right shoulder: Secondary | ICD-10-CM | POA: Diagnosis not present

## 2017-06-09 NOTE — Assessment & Plan Note (Signed)
Injection per patient request. Return in 1 month for this.

## 2017-06-09 NOTE — Assessment & Plan Note (Signed)
Injection provided temporary relief, MRI, she does need aggressive physical therapy.

## 2017-06-09 NOTE — Progress Notes (Signed)
Subjective:    I'm seeing this patient as a consultation for:  Rosita Kea PA-C  CC:  Right shoulder pain  HPI: Amber Hanson, a 56yo woman with pmh significant for right shoulder bursitis, left greater trochanteric bursitis, and cervical radiculopathy presents in clinic today with a cc of right shoulder pain that has worsened in frequency (all but continuous) since her last office visit. Pt reports that the pain is centered around the C6 dermatone in her shoulder and radiates down to her right elbow and right thumb and digits 4 and 5 on her right hand.   Pt also reports associating numbness in digits 1, 4 and 5 in her right hand. Pt endorses some alleviation of pain (from 10/10 ==> 6/10 with activities of daily living) after the administration of a subacromial  Injection in February. Pt cannot recall how long the relief lasted.   Pt also endorses the use of tylenol and ice to help alleviate the pain. Pt reports that the right shoulder pain (and its radiating qualities) does keep her from sleeping. But pt denies use of flexeril to manage the right shoulder pain (and its radiating qualities) at night.  Pt reports that she is apprehensive about adding flexeril to her current nightly medication regimen which already includes quetiapine. Pt reports that she awakes in the morning with right shoulder pain, but that it worsens with activity and is the worst at night.    Pt also endorsed worsening pain in her left hip and express that she would like to obtain immediate relief today with an injection.    I reviewed the past medical history, family history, social history, surgical history, and allergies today and no changes were needed.  Please see the problem list section below in epic for further details.  Past Medical History: Past Medical History:  Diagnosis Date  . Anxiety 03/08/2017  . Arthritis 03/08/2017  . Bipolar 1 disorder (HCC) 08/20/2015   Overview:  Dr. Chilton Si, [facility]. Now Dr  Jordan Likes   Last Assessment & Plan:  Relevant Hx: Course: Daily Update: Today's Plan:  . Chronic low back pain 07/05/2011   Overview:  Dr. Karie Schwalbe. [facility]  . Gastroesophageal reflux disease without esophagitis 08/20/2015  . Insomnia 03/05/2013  . Migraine without status migrainosus, not intractable 09/19/2012  . Postmenopausal HRT (hormone replacement therapy) 08/20/2015   Past Surgical History: Past Surgical History:  Procedure Laterality Date  . CESAREAN SECTION     (2)   . CHOLECYSTECTOMY    . KNEE SURGERY Bilateral   . VAGINAL HYSTERECTOMY     Social History: Social History   Socioeconomic History  . Marital status: Married    Spouse name: Not on file  . Number of children: 2  . Years of education: Not on file  . Highest education level: Associate degree: academic program  Occupational History  . Not on file  Social Needs  . Financial resource strain: Not on file  . Food insecurity:    Worry: Not on file    Inability: Not on file  . Transportation needs:    Medical: Not on file    Non-medical: Not on file  Tobacco Use  . Smoking status: Never Smoker  . Smokeless tobacco: Never Used  Substance and Sexual Activity  . Alcohol use: No    Frequency: Never  . Drug use: No  . Sexual activity: Yes    Partners: Male    Birth control/protection: None  Lifestyle  . Physical activity:  Days per week: 7 days    Minutes per session: Not on file  . Stress: Not on file  Relationships  . Social connections:    Talks on phone: Not on file    Gets together: Not on file    Attends religious service: Not on file    Active member of club or organization: Not on file    Attends meetings of clubs or organizations: Not on file    Relationship status: Not on file  Other Topics Concern  . Not on file  Social History Narrative  . Not on file   Family History: Family History  Problem Relation Age of Onset  . Cancer Mother   . Depression Father   . Diabetes Father   .  Depression Sister   . Depression Brother   . Stroke Other   . GER disease Other    Allergies: Allergies  Allergen Reactions  . Sulfa Antibiotics Anaphylaxis  . Ibuprofen Other (See Comments)    Affects liver functions  . Red Dye Itching    Patient states she has taken this safely before since the one episode of itching   . Tramadol Nausea And Vomiting and Other (See Comments)    Headaches    Medications: See med rec.  Review of Systems: No headache, visual changes, nausea, vomiting, diarrhea, constipation, dizziness, abdominal pain, skin rash, fevers, chills, night sweats, weight loss, swollen lymph nodes, body aches, joint swelling, muscle aches, chest pain, shortness of breath, mood changes, visual or auditory hallucinations.   Objective:   General: Well Developed, well nourished, and in no acute distress.  Neuro:  Extra-ocular muscles intact, able to move all 4 extremities, sensation grossly intact.  Deep tendon reflexes tested were normal. Psych: Alert and oriented, mood congruent with affect. ENT:  Ears and nose appear unremarkable.  Hearing grossly normal. Neck: Unremarkable overall appearance, trachea midline.  No visible thyroid enlargement. Eyes: Conjunctivae and lids appear unremarkable.  Pupils equal and round. Skin: Warm and dry, no rashes noted.  Cardiovascular: Pulses palpable, no extremity edema.  Pinpoint tendernessover C6/C7/C8 cervical spine  that does not radiate.   Right Shoulder: Inspection reveals no abnormalities, atrophy or asymmetry. Palpation is normal with no tenderness over AC joint or bicipital groove. ROM is limited and patient is unable to fully flex her arm or adduct and internally rotate in order to touch her back.  Pain is elicited in C6 dermatone in all plains of rotation (flexion, extension, internal and external rotation, horizontal adduction)  Rotator cuff strength normal throughout. Signs of impingement with exacerbation of shoulder pain  elicited with Neer and Hawkin's tests,and  empty can sign. Speeds and Yergason's tests normal. (pain is not worsened with these maneuvers)  No labral pathology noted with negative Obrien's, negative clunk and good stability. Normal scapular function observed. No painful arc and no drop arm sign. No apprehension sign   Right Wrist: Inspection normal with no visible erythema or swelling. ROM smooth and normal with good flexion and extension and ulnar/radial deviation that is symmetrical with opposite wrist. Palpation is normal over metacarpals, navicular, lunate, and TFCC; tendons without tenderness/ swelling No snuffbox tenderness. No tenderness over Canal of Guyon. Strength 5/5 in all directions without pain. Baseline numbness in digits 1, 4 and 5 that is not exacerbated by the following maneuvers. Negative Finkelstein, tinel's and phalens. Negative Watson's test.   Impression and Recommendations:   This case required medical decision making of moderate complexity.  Further imaging (MRI) to  discern if degenerative disease has worsened Greater trochanteric bursitis of left hip Injection per patient request. Return in 1 month for this.  Right cervical radiculopathy Multiple nerve distribution radiculitis on the right, neck and periscapular pain, she never did any physical therapy. Adding an MRI, and she does need to do formal physical therapy. I do suspect we will have to proceed to intervention.  Pt directed to follow up after 1 month of physical therapy.  ___________________________________________ Ihor Austinhomas J. Benjamin Stainhekkekandam, M.D., ABFM., CAQSM. Primary Care and Sports Medicine [facility]  Adjunct Instructor of Family Medicine  [employer]

## 2017-06-09 NOTE — Assessment & Plan Note (Signed)
Multiple nerve distribution radiculitis on the right, neck and periscapular pain, she never did any physical therapy. Adding an MRI, and she does need to do formal physical therapy. I do suspect we will have to proceed to intervention.

## 2017-06-20 ENCOUNTER — Ambulatory Visit (INDEPENDENT_AMBULATORY_CARE_PROVIDER_SITE_OTHER): Payer: Medicare HMO

## 2017-06-20 DIAGNOSIS — M5412 Radiculopathy, cervical region: Secondary | ICD-10-CM

## 2017-06-20 DIAGNOSIS — S43491A Other sprain of right shoulder joint, initial encounter: Secondary | ICD-10-CM

## 2017-06-20 DIAGNOSIS — M4312 Spondylolisthesis, cervical region: Secondary | ICD-10-CM

## 2017-06-20 DIAGNOSIS — M19011 Primary osteoarthritis, right shoulder: Secondary | ICD-10-CM | POA: Diagnosis not present

## 2017-06-20 DIAGNOSIS — M7551 Bursitis of right shoulder: Secondary | ICD-10-CM

## 2017-06-20 DIAGNOSIS — M25511 Pain in right shoulder: Secondary | ICD-10-CM

## 2017-06-20 DIAGNOSIS — M5013 Cervical disc disorder with radiculopathy, cervicothoracic region: Secondary | ICD-10-CM

## 2017-06-22 ENCOUNTER — Telehealth: Payer: Self-pay | Admitting: Sports Medicine

## 2017-06-22 NOTE — Telephone Encounter (Signed)
Pt called and stated she got her mri results and wants to know if you are still wanting her to go to PT. She said she has an appointment over there on Monday may 6th. She has an appointment with you on May 30th, would you want her to come in sooner to see you? She said as of right now her shoulder pain has eased off.

## 2017-06-23 NOTE — Telephone Encounter (Signed)
Definitely still do physical therapy, we are hoping to prevent this from coming back.

## 2017-06-24 NOTE — Telephone Encounter (Signed)
Called and advised patient to still go to PT.

## 2017-06-27 ENCOUNTER — Ambulatory Visit: Payer: Medicare HMO | Admitting: Rehabilitative and Restorative Service Providers"

## 2017-06-28 ENCOUNTER — Encounter: Payer: Self-pay | Admitting: Sports Medicine

## 2017-06-28 ENCOUNTER — Ambulatory Visit (INDEPENDENT_AMBULATORY_CARE_PROVIDER_SITE_OTHER): Payer: Medicare HMO | Admitting: Sports Medicine

## 2017-06-28 DIAGNOSIS — M7062 Trochanteric bursitis, left hip: Secondary | ICD-10-CM | POA: Diagnosis not present

## 2017-06-28 DIAGNOSIS — M5412 Radiculopathy, cervical region: Secondary | ICD-10-CM | POA: Diagnosis not present

## 2017-06-28 DIAGNOSIS — M19011 Primary osteoarthritis, right shoulder: Secondary | ICD-10-CM | POA: Diagnosis not present

## 2017-06-28 NOTE — Assessment & Plan Note (Signed)
Multiple nerve distribution radiculitis on the right. Neck and periscapular pain. MRI shows multilevel cervical degenerative disc disease with multiple levels of central and foraminal stenosis although mild. Continue with physical therapy for now before proceeding with an epidural.

## 2017-06-28 NOTE — Assessment & Plan Note (Signed)
100% resolved after injection at the last visit

## 2017-06-28 NOTE — Progress Notes (Signed)
Subjective:    CC: MRI  HPI: This is a pleasant 57 year old female, we saw her for neck pain, shoulder pain.  MRIs were obtained from both structures.  Her shoulder hurts at the joint line, worse with abduction, external rotation.  Moderate, persistent without radiation.  She also has neck pain, right-sided, with periscapular symptoms.  Trochanteric bursitis is completely resolved after injection at the last visit.  I reviewed the past medical history, family history, social history, surgical history, and allergies today and no changes were needed.  Please see the problem list section below in epic for further details.  Past Medical History: Past Medical History:  Diagnosis Date  . Anxiety 03/08/2017  . Arthritis 03/08/2017  . Bipolar 1 disorder (HCC) 08/20/2015   Overview:  Dr. Chilton Si, [facility]. Now Dr Jordan Likes   Last Assessment & Plan:  Relevant Hx: Course: Daily Update: Today's Plan:  . Chronic low back pain 07/05/2011   Overview:  Dr. Karie Schwalbe. [facility]  . Gastroesophageal reflux disease without esophagitis 08/20/2015  . Insomnia 03/05/2013  . Migraine without status migrainosus, not intractable 09/19/2012  . Postmenopausal HRT (hormone replacement therapy) 08/20/2015   Past Surgical History: Past Surgical History:  Procedure Laterality Date  . CESAREAN SECTION     (2)   . CHOLECYSTECTOMY    . KNEE SURGERY Bilateral   . VAGINAL HYSTERECTOMY     Social History: Social History   Socioeconomic History  . Marital status: Married    Spouse name: Not on file  . Number of children: 2  . Years of education: Not on file  . Highest education level: Associate degree: academic program  Occupational History  . Not on file  Social Needs  . Financial resource strain: Not on file  . Food insecurity:    Worry: Not on file    Inability: Not on file  . Transportation needs:    Medical: Not on file    Non-medical: Not on file  Tobacco Use  . Smoking status: Never Smoker  .  Smokeless tobacco: Never Used  Substance and Sexual Activity  . Alcohol use: No    Frequency: Never  . Drug use: No  . Sexual activity: Yes    Partners: Male    Birth control/protection: None  Lifestyle  . Physical activity:    Days per week: 7 days    Minutes per session: Not on file  . Stress: Not on file  Relationships  . Social connections:    Talks on phone: Not on file    Gets together: Not on file    Attends religious service: Not on file    Active member of club or organization: Not on file    Attends meetings of clubs or organizations: Not on file    Relationship status: Not on file  Other Topics Concern  . Not on file  Social History Narrative  . Not on file   Family History: Family History  Problem Relation Age of Onset  . Cancer Mother   . Depression Father   . Diabetes Father   . Depression Sister   . Depression Brother   . Stroke Other   . GER disease Other    Allergies: Allergies  Allergen Reactions  . Sulfa Antibiotics Anaphylaxis  . Ibuprofen Other (See Comments)    Affects liver functions  . Red Dye Itching    Patient states she has taken this safely before since the one episode of itching   . Tramadol Nausea And  Vomiting and Other (See Comments)    Headaches    Medications: See med rec.  Review of Systems: No fevers, chills, night sweats, weight loss, chest pain, or shortness of breath.   Objective:    General: Well Developed, well nourished, and in no acute distress.  Neuro: Alert and oriented x3, extra-ocular muscles intact, sensation grossly intact.  HEENT: Normocephalic, atraumatic, pupils equal round reactive to light, neck supple, no masses, no lymphadenopathy, thyroid nonpalpable.  Skin: Warm and dry, no rashes. Cardiac: Regular rate and rhythm, no murmurs rubs or gallops, no lower extremity edema.  Respiratory: Clear to auscultation bilaterally. Not using accessory muscles, speaking in full sentences.  Procedure: Real-time  Ultrasound Guided Injection of right glenohumeral joint Device: GE Logiq E  Verbal informed consent obtained.  Time-out conducted.  Noted no overlying erythema, induration, or other signs of local infection.  Skin prepped in a sterile fashion.  Local anesthesia: Topical Ethyl chloride.  With sterile technique and under real time ultrasound guidance: Using a 22-gauge spinal needle advanced into the glenohumeral joint and injected 1 cc Kenalog 40, 2 cc lidocaine, 2 cc bupivacaine. Completed without difficulty  Pain immediately resolved suggesting accurate placement of the medication.  Advised to call if fevers/chills, erythema, induration, drainage, or persistent bleeding.  Images permanently stored and available for review in the ultrasound unit.  Impression: Technically successful ultrasound guided injection.  Impression and Recommendations:    Primary osteoarthritis of right shoulder Glenohumeral osteoarthritis, labral fraying, degeneration of the biceps anchor. All coming from the glenohumeral joint. Per patient request glenohumeral joint injection as above, return in 6 weeks.  She will continue to work hard with physical therapy.  Right cervical radiculopathy Multiple nerve distribution radiculitis on the right. Neck and periscapular pain. MRI shows multilevel cervical degenerative disc disease with multiple levels of central and foraminal stenosis although mild. Continue with physical therapy for now before proceeding with an epidural.  Greater trochanteric bursitis of left hip 100% resolved after injection at the last visit ___________________________________________ Ihor Austin. Benjamin Stain, M.D., ABFM., CAQSM. Primary Care and Sports Medicine [facility]  Adjunct Instructor of Family Medicine  [employer]

## 2017-06-28 NOTE — Assessment & Plan Note (Signed)
Glenohumeral osteoarthritis, labral fraying, degeneration of the biceps anchor. All coming from the glenohumeral joint. Per patient request glenohumeral joint injection as above, return in 6 weeks.  She will continue to work hard with physical therapy.

## 2017-07-15 LAB — COMPLETE METABOLIC PANEL WITH GFR
AG RATIO: 1.6 (calc) (ref 1.0–2.5)
ALKALINE PHOSPHATASE (APISO): 64 U/L (ref 33–130)
ALT: 19 U/L (ref 6–29)
AST: 17 U/L (ref 10–35)
Albumin: 3.7 g/dL (ref 3.6–5.1)
BILIRUBIN TOTAL: 0.4 mg/dL (ref 0.2–1.2)
BUN: 16 mg/dL (ref 7–25)
CHLORIDE: 104 mmol/L (ref 98–110)
CO2: 31 mmol/L (ref 20–32)
Calcium: 9.2 mg/dL (ref 8.6–10.4)
Creat: 0.78 mg/dL (ref 0.50–1.05)
GFR, EST AFRICAN AMERICAN: 98 mL/min/{1.73_m2} (ref >=60)
GFR, Est Non African American: 84 mL/min/{1.73_m2} (ref >=60)
Globulin: 2.3 g/dL (calc) (ref 1.9–3.7)
Glucose, Bld: 71 mg/dL (ref 65–99)
POTASSIUM: 3.9 mmol/L (ref 3.5–5.3)
Sodium: 140 mmol/L (ref 135–146)
TOTAL PROTEIN: 6 g/dL — AB (ref 6.1–8.1)

## 2017-07-19 ENCOUNTER — Ambulatory Visit (INDEPENDENT_AMBULATORY_CARE_PROVIDER_SITE_OTHER): Payer: Medicare HMO | Admitting: Osteopathic Medicine

## 2017-07-19 ENCOUNTER — Encounter: Payer: Self-pay | Admitting: Osteopathic Medicine

## 2017-07-19 DIAGNOSIS — N390 Urinary tract infection, site not specified: Secondary | ICD-10-CM | POA: Diagnosis not present

## 2017-07-19 DIAGNOSIS — R300 Dysuria: Secondary | ICD-10-CM

## 2017-07-19 LAB — POCT URINALYSIS DIPSTICK
Glucose, UA: NEGATIVE
Leukocytes, UA: NEGATIVE
Nitrite, UA: POSITIVE
Protein, UA: POSITIVE — AB
RBC UA: NEGATIVE
Urobilinogen, UA: 0.2 E.U./dL
pH, UA: 5.5 (ref 5.0–8.0)

## 2017-07-19 MED ORDER — ESTRADIOL 0.1 MG/GM VA CREA: TOPICAL_CREAM | VAGINAL | 12 refills | Status: DC

## 2017-07-19 MED ORDER — CIPROFLOXACIN HCL 500 MG PO TABS: 500.0000 mg | ORAL_TABLET | Freq: Two times a day (BID) | ORAL | 0 refills | Status: DC

## 2017-07-19 NOTE — Progress Notes (Signed)
HPI: Amber Hanson is a 57 y.o. female who  has a past medical history of Anxiety (03/08/2017), Arthritis (03/08/2017), Bipolar 1 disorder (HCC) (08/20/2015), Chronic low back pain (07/05/2011), Gastroesophageal reflux disease without esophagitis (08/20/2015), Insomnia (03/05/2013), Migraine without status migrainosus, not intractable (09/19/2012), and Postmenopausal HRT (hormone replacement therapy) (08/20/2015).  she presents to [facility] today, 07/19/17,  for chief complaint of:  Concern for UTI   Dysuria for about 3 weeks or so, coming and going.  Tried to treat at home with plenty of water/cranberry juice but does not seem to be getting better.  No abdominal pain, no flank pain, no nausea, no hematuria.   Past medical history, surgical history, and family history reviewed.  Current medication list and allergy/intolerance information reviewed.   (See remainder of HPI, ROS, Phys Exam below)  No results found.  Results for orders placed or performed in visit on 07/19/17 (from the past 72 hour(s))  Urinalysis, microscopic only     Status: Abnormal   Collection Time: 07/19/17  2:54 PM  Result Value Ref Range   WBC, UA 6-10 (A) 0 - 5 /HPF   RBC / HPF 0-2 0 - 2 /HPF   Squamous Epithelial / LPF 10-20 (A) < OR = 5 /HPF   Bacteria, UA MANY (A) NONE SEEN /HPF   Hyaline Cast 0-1 (A) NONE SEEN /LPF  POCT Urinalysis Dipstick     Status: Abnormal   Collection Time: 07/19/17  3:03 PM  Result Value Ref Range   Color, UA AMBER    Clarity, UA CLOUDY    Glucose, UA Negative Negative   Bilirubin, UA SMALL    Ketones, UA TRACE    Spec Grav, UA >=1.030 (A) 1.010 - 1.025   Blood, UA NEGATIVE    pH, UA 5.5 5.0 - 8.0   Protein, UA Positive (A) Negative   Urobilinogen, UA 0.2 0.2 or 1.0 E.U./dL   Nitrite, UA POSITIVE    Leukocytes, UA Negative Negative   Appearance     Odor       ASSESSMENT/PLAN:   Possible contaminated sample, no leukocytes, minimal WBC, some  epithelial cells but nitrites are concerning and of course symptomatic dysuria.  We will go ahead and treat for complicated UTI, symptoms greater than 1 week.  Patient advised he may need to change antibiotics based on culture results which will be forthcoming in the next few days  She also states the Premarin cream was too expensive, would like to try something else.  Estrace prescribed  Dysuria - Plan: POCT Urinalysis Dipstick, Urine Culture, Urinalysis, microscopic only  Complicated UTI (urinary tract infection)   Meds ordered this encounter  Medications  . ciprofloxacin (CIPRO) 500 MG tablet    Sig: Take 1 tablet (500 mg total) by mouth 2 (two) times daily.    Dispense:  14 tablet    Refill:  0  . estradiol (ESTRACE VAGINAL) 0.1 MG/GM vaginal cream    Sig: Place 1 Applicatorful vaginally at bedtime for 7 days, THEN 1 Applicatorful 3 (three) times a week.    Dispense:  42.5 g    Refill:  12     Follow-up plan: Return if symptoms worsen or fail to improve.     ############################################ ############################################ ############################################ ############################################    Outpatient Encounter Medications as of 07/19/2017  Medication Sig  . Acetaminophen-Caffeine 500-65 MG TABS Take by mouth.  . busPIRone (BUSPAR) 15 MG tablet   . celecoxib (CELEBREX) 200 MG capsule Take  1 capsule (200 mg total) by mouth 2 (two) times daily.  . cetirizine (ZYRTEC) 10 MG tablet Take by mouth.  . diclofenac sodium (VOLTAREN) 1 % GEL Apply 2-4 g topically 4 (four) times daily. To affected joint.  . DULoxetine (CYMBALTA) 60 MG capsule   . estradiol (ESTRACE) 1 MG tablet TAKE 1 TABLET BY MOUTH ONCE DAILY  . Eszopiclone 3 MG TABS Take by mouth.  . Misc. Devices MISC Tri-Est SR 8:1:1 with methyltestosterone 0.625/1.25mg   . pantoprazole (PROTONIX) 20 MG tablet Take 1 tablet (20 mg total) by mouth daily.  . propranolol (INDERAL)  20 MG tablet TAKE 1 TABLET TWICE DAILY  . QUEtiapine (SEROQUEL) 50 MG tablet Take by mouth.   No facility-administered encounter medications on file as of 07/19/2017.    Allergies  Allergen Reactions  . Other Itching, Nausea And Vomiting and Other (See Comments)    Patient states she has taken this safely before since the one episode of itching  Anti-inflammatory - affects liver function Patient states she has taken this safely before since the one episode of itching  Anti-inflammatory - affects liver function  Patient states she has taken this safely before since the one episode of itching  Anti-inflammatory - affects liver function  . Red Dye Itching    Patient states she has taken this safely before since the one episode of itching    . Sulfa Antibiotics Anaphylaxis  . Sulfasalazine Anaphylaxis and Swelling  . Tramadol Nausea And Vomiting and Other (See Comments)    Headaches    . Ibuprofen Other (See Comments)    Affects liver functions      Review of Systems:  Constitutional: No recent illness, no fever or chills  Cardiac: No  chest pain  Respiratory:  No  shortness of breath  Gastrointestinal: No  abdominal pain, no change on bowel habits  Musculoskeletal: No new myalgia/arthralgia  Skin: No  Rash   Exam:  BP 111/79 (BP Location: Left Arm, Patient Position: Sitting, Cuff Size: Normal)   Pulse 74   Temp 98 F (36.7 C) (Oral)   Wt 167 lb 8 oz (76 kg)   LMP  (LMP Unknown)   BMI 28.75 kg/m   Constitutional: VS see above. General Appearance: alert, well-developed, well-nourished, NAD  Eyes: Normal lids and conjunctive, non-icteric sclera  Ears, Nose, Mouth, Throat: MMM, Normal external inspection ears/nares/mouth/lips/gums.  Neck: No masses, trachea midline.   Respiratory: Normal respiratory effort.   Musculoskeletal: Gait normal. Symmetric and independent movement of all extremities  Neurological: Normal balance/coordination. No tremor.  Skin:  warm, dry, intact.   Psychiatric: Normal judgment/insight. Normal mood and affect. Oriented x3.   Visit summary with medication list and pertinent instructions was printed for patient to review, advised to alert [place] if any changes needed. All questions at time of visit were answered - patient instructed to contact office with any additional concerns. ER/RTC precautions were reviewed with the patient and understanding verbalized.   Follow-up plan: Return if symptoms worsen or fail to improve.    Please note: voice recognition software was used to produce this document, and typos may escape review. Please contact Dr. Lyn Hollingshead for any needed clarifications.

## 2017-07-20 ENCOUNTER — Encounter: Payer: Self-pay | Admitting: Osteopathic Medicine

## 2017-07-21 ENCOUNTER — Ambulatory Visit: Payer: Medicare HMO | Admitting: Sports Medicine

## 2017-07-21 LAB — URINALYSIS, MICROSCOPIC ONLY

## 2017-07-21 LAB — URINE CULTURE
MICRO NUMBER:: 90642508
SPECIMEN QUALITY:: ADEQUATE

## 2017-07-26 ENCOUNTER — Other Ambulatory Visit: Payer: Self-pay | Admitting: Osteopathic Medicine

## 2017-07-26 DIAGNOSIS — N951 Menopausal and female climacteric states: Secondary | ICD-10-CM

## 2017-07-26 DIAGNOSIS — Z780 Asymptomatic menopausal state: Secondary | ICD-10-CM

## 2017-07-26 DIAGNOSIS — Z79890 Hormone replacement therapy (postmenopausal): Secondary | ICD-10-CM

## 2017-08-03 ENCOUNTER — Other Ambulatory Visit: Payer: Self-pay

## 2017-08-03 ENCOUNTER — Telehealth: Payer: Self-pay

## 2017-08-03 DIAGNOSIS — K219 Gastro-esophageal reflux disease without esophagitis: Secondary | ICD-10-CM

## 2017-08-03 DIAGNOSIS — F419 Anxiety disorder, unspecified: Secondary | ICD-10-CM

## 2017-08-03 MED ORDER — PANTOPRAZOLE SODIUM 20 MG PO TBEC: 20.0000 mg | DELAYED_RELEASE_TABLET | Freq: Every day | ORAL | 0 refills | Status: DC

## 2017-08-03 MED ORDER — PROPRANOLOL HCL 20 MG PO TABS: 20.0000 mg | ORAL_TABLET | Freq: Two times a day (BID) | ORAL | 3 refills | Status: DC

## 2017-08-03 NOTE — Telephone Encounter (Signed)
Record review, is taking for migraine prophylaxis.  Okay to refill, I sent 1 year.

## 2017-08-03 NOTE — Telephone Encounter (Signed)
Pt has been updated.  

## 2017-08-03 NOTE — Telephone Encounter (Signed)
[facility] requesting med RF for propranolol 20 mg. Rx written by historical provider. Pls advise if RF appropriate.

## 2017-08-05 ENCOUNTER — Encounter: Payer: Self-pay | Admitting: Rehabilitative and Restorative Service Providers"

## 2017-08-05 ENCOUNTER — Ambulatory Visit (INDEPENDENT_AMBULATORY_CARE_PROVIDER_SITE_OTHER): Payer: Medicare HMO | Admitting: Rehabilitative and Restorative Service Providers"

## 2017-08-05 DIAGNOSIS — M25511 Pain in right shoulder: Secondary | ICD-10-CM | POA: Diagnosis not present

## 2017-08-05 DIAGNOSIS — M542 Cervicalgia: Secondary | ICD-10-CM

## 2017-08-05 DIAGNOSIS — G8929 Other chronic pain: Secondary | ICD-10-CM

## 2017-08-05 DIAGNOSIS — R29898 Other symptoms and signs involving the musculoskeletal system: Secondary | ICD-10-CM | POA: Diagnosis not present

## 2017-08-05 DIAGNOSIS — R293 Abnormal posture: Secondary | ICD-10-CM | POA: Diagnosis not present

## 2017-08-05 NOTE — Patient Instructions (Signed)
Axial Extension (Chin Tuck)    Pull chin in and lengthen back of neck. Hold __5__ seconds while counting out loud. Repeat __10__ times. Do __several__ sessions per day.  Shoulder Blade Squeeze   Can use swim noodle along spine standing at wall  Rotate shoulders back, then squeeze shoulder blades down and back. Hold 10 sec Repeat __10__ times. Do _several___ sessions per day.  Upper Back Strength: Lower Trapezius / Rotator Cuff " L's "     Arms in waitress pose, palms up. Press hands back and slide shoulder blades down. Hold for __5__ seconds. Repeat _10___ times. 1-2 times per day.    Scapular Retraction: Elbow Flexion (Standing)  "W's"     With elbows bent to 90, pinch shoulder blades together and rotate arms out, keeping elbows bent. Repeat __10__ times per set. Do __1-2__ sets per session. Do _several ___ sessions per day.  TENS UNIT: This is helpful for muscle pain and spasm.   Search and Purchase a TENS 7000 2nd edition at www.tenspros.com. It should be less than $30.     TENS unit instructions: Do not shower or bathe with the unit on Turn the unit off before removing electrodes or batteries If the electrodes lose stickiness add a drop of water to the electrodes after they are disconnected from the unit and place on plastic sheet. If you continued to have difficulty, call the TENS unit company to purchase more electrodes. Do not apply lotion on the skin area prior to use. Make sure the skin is clean and dry as this will help prolong the life of the electrodes. After use, always check skin for unusual red areas, rash or other skin difficulties. If there are any skin problems, does not apply electrodes to the same area. Never remove the electrodes from the unit by pulling the wires. Do not use the TENS unit or electrodes other than as directed. Do not change electrode placement without consultating your therapist or physician. Keep 2 fingers with between each  electrode.    [facility] [address] [place], [place] [address]  [phone] (office) [phone] (fax)

## 2017-08-05 NOTE — Therapy (Addendum)
[place], [place], [address] Phone: [phone]   Fax:  [phone]  Physical Therapy Evaluation  Patient Details  Name: Amber Hanson MRN: [id number] Date of Birth: Dec 27, 1960 Referring Provider: Dr Dianah Field    Encounter Date: 08/05/2017  PT End of Session - 08/05/17 1238    Visit Number  1    Number of Visits  12    Date for PT Re-Evaluation  09/16/17    PT Start Time  0933    PT Stop Time  1030    PT Time Calculation (min)  57 min    Activity Tolerance  Patient tolerated treatment well       Past Medical History:  Diagnosis Date  . Anxiety 03/08/2017  . Arthritis 03/08/2017  . Bipolar 1 disorder ([place]) 08/20/2015   Overview:  Dr. Nyoka Cowden, [facility]. Now Dr Annette Stable   Last Assessment & Plan:  Relevant Hx: Course: Daily Update: Today's Plan:  . Chronic low back pain 07/05/2011   Overview:  Dr. Darene Lamer. [facility]  . Gastroesophageal reflux disease without esophagitis 08/20/2015  . Insomnia 03/05/2013  . Migraine without status migrainosus, not intractable 09/19/2012  . Postmenopausal HRT (hormone replacement therapy) 08/20/2015    Past Surgical History:  Procedure Laterality Date  . CESAREAN SECTION     (2)   . CHOLECYSTECTOMY    . KNEE SURGERY Bilateral   . VAGINAL HYSTERECTOMY      There were no vitals filed for this visit.   Subjective Assessment - 08/05/17 0942    Subjective  Patient reports gradual onset of Rt shoulder pain in the past 3-6 months with no known injury. She has a history of neck pain for more that 6 -12 months. Symptoms have gradually increased over the past 6 months.     Pertinent History  neck pain for 10 years; arthritis; bilat knee surgery    Diagnostic tests  xrays DDD cervical spine     Patient Stated Goals  get rid of some of the pain     Currently in Pain?  Yes    Pain Score  6     Pain Location  Neck    Pain Orientation  Right    Pain Descriptors /  Indicators  Aching    Pain Type  Chronic pain    Pain Radiating Towards  into Rt shoulder and arm on an intermittent basis     Pain Onset  More than a month ago    Pain Frequency  Constant    Aggravating Factors   moving or using Rt arm; bending forward; lifting anything     Pain Relieving Factors  rest; ice          [facility] PT Assessment - 08/05/17 0001      Assessment   Medical Diagnosis  Cervical radiculopathy Rt UE pain     Referring Provider  Dr Dianah Field     Onset Date/Surgical Date  01/22/17    Hand Dominance  Right    Next MD Visit  08/09/17    Prior Therapy  none for neck       Precautions   Precautions  None      Balance Screen   Has the patient fallen in the past 6 months  No    Has the patient had a decrease in activity level because of a fear of falling?   No    Is the patient reluctant to leave their home because  of a fear of falling?   No      Prior Function   Level of Independence  Independent    Vocation  On disability on disability for mental problems since 2012    Leisure  household chores; gardening; cares for husband who has dementia       Observation/Other Assessments   Focus on Therapeutic Outcomes (FOTO)   64% limitation       Sensation   Additional Comments  intermittent numbness and tingling in Rt hand - mostly pain       Posture/Postural Control   Posture Comments  head forward; shoudlers rounded and elevated; head of the humerus anterior in orientatioin; increased thoracic kyphosis       AROM   Cervical Flexion  43    Cervical Extension  45    Cervical - Right Side Bend  33    Cervical - Left Side Bend  34    Cervical - Right Rotation  65    Cervical - Left Rotation  67      Strength   Overall Strength Comments  5/5 bilat shoulders pain iwht resistive testing Rt shd flexion; abduction and ER       Palpation   Spinal mobility  hypomobilty and pain with CPA mobs thoracic and cervical spine     Palpation comment  muscular tightness  through Rt > Lt pecs; upper trap; leveator; ant/lat/posterior cervical musculature; teres       Special Tests   Other special tests  positive neural tension test Rt > Lt                 Objective measurements completed on examination: See above findings.      [facility] PT Treatment/Exercise - 08/05/17 0001      Neuro Re-ed    Neuro Re-ed Details   initiated postural correction       Shoulder Exercises: Standing   Other Standing Exercises  scap squeeze 10 sec x 5; L's x 10; W's x 10 with swim noodle       Modalities   Modalities  -- better response to cold for pain management per pt       Cryotherapy   Number Minutes Cryotherapy  15 Minutes    Cryotherapy Location  Cervical;Shoulder Rt shoulder       Electrical Stimulation   Electrical Stimulation Location  Rt shoulder girdle     Electrical Stimulation Action  TENS     Electrical Stimulation Parameters  to toerance    Electrical Stimulation Goals  Pain;Tone      Neck Exercises: Stretches   Other Neck Stretches  axial extension 10 sec x 5              PT Education - 08/05/17 1009    Education Details  HEP TENS     Person(s) Educated  Patient    Methods  Explanation;Demonstration;Tactile cues;Verbal cues;Handout    Comprehension  Verbalized understanding;Returned demonstration;Verbal cues required;Tactile cues required          PT Long Term Goals - 08/05/17 1242      PT LONG TERM GOAL #1   Title  Improve posture and alignment with patient demonstrating improved upright posture with posterior shoulder girdle engaged 09/16/17    Time  6    Period  Weeks    Status  New      PT LONG TERM GOAL #2   Title  Increased cervical ROM in rotation and lateral  flesion by 5-8 degrees 09/16/17    Time  6    Period  Weeks    Status  New      PT LONG TERM GOAL #3   Title  Decrease pain by 50-70% allowing patient to perform functional activities with greater ease 09/16/17    Time  6    Period  Weeks    Status   New      PT LONG TERM GOAL #4   Title  Independent in HEP 09/16/17    Time  6    Period  Weeks    Status  New      PT LONG TERM GOAL #5   Title  Improve FOTO to </= 48% limitation 09/16/17    Time  6    Period  Weeks    Status  New             Plan - 08/05/17 1239    Clinical Impression Statement  Margeart presents with history of chronic Rt cervical and shoulder/UE pain. She has poor posture and alignment; limited cervical and shoulder mobility/ROM; pain with resistive Rt UE strength testing; pain with palpation Rt upper quarter. She will benefit form PT to address problems identified.     Clinical Presentation  Stable    Clinical Decision Making  Low    Rehab Potential  Good    Clinical Impairments Affecting Rehab Potential  chronic nature of pain and dysfunction; sedentary lifestyle     PT Frequency  2x / week    PT Duration  6 weeks    PT Treatment/Interventions  Patient/family education;ADLs/Self Care Home Management;Cryotherapy;Electrical Stimulation;Iontophoresis 41m/ml Dexamethasone;Moist Heat;Ultrasound;Traction;Dry needling;Manual techniques;Neuromuscular re-education;Therapeutic activities;Therapeutic exercise    PT Next Visit Plan  review HEP; add pec stretches; myofacial ball release work; manual work Rt upper quarter; assess neural tightness; progress exercises as tolerated; modalities as indicated     Consulted and Agree with Plan of Care  Patient       Patient will benefit from skilled therapeutic intervention in order to improve the following deficits and impairments:  Postural dysfunction, Improper body mechanics, Increased fascial restricitons, Increased muscle spasms, Hypomobility, Decreased mobility, Decreased range of motion, Decreased activity tolerance, Pain  Visit Diagnosis: Cervicalgia - Plan: PT plan of care cert/re-cert, CANCELED: PT plan of care cert/re-cert  Chronic right shoulder pain - Plan: PT plan of care cert/re-cert, CANCELED: PT plan of care  cert/re-cert  Abnormal posture - Plan: PT plan of care cert/re-cert, CANCELED: PT plan of care cert/re-cert  Other symptoms and signs involving the musculoskeletal system - Plan: PT plan of care cert/re-cert, CANCELED: PT plan of care cert/re-cert     Problem List Patient Active Problem List   Diagnosis Date Noted  . Greater trochanteric bursitis of left hip 06/09/2017  . H/O hysterectomy for benign disease 04/25/2017  . Primary osteoarthritis of right shoulder 04/04/2017  . Right cervical radiculopathy 04/04/2017  . Anxiety 03/08/2017  . Arthritis 03/08/2017  . Plantar fasciitis, left 08/16/2016  . S/P left knee arthroscopy 11/11/2015  . Acute medial meniscal tear 10/29/2015  . Allergic rhinitis due to pollen 08/20/2015  . [place] 08[place]  . Gastroesophageal reflux disease 08/20/2015  . Multiple joint pain 08/20/2015  . Postmenopausal HRT (hormone replacement therapy) 08/20/2015  . Primary osteoarthritis of right knee 07/12/2015  . Primary osteoarthritis of first carpometacarpal joint of left hand 09/23/2014  . Insomnia 03/05/2013  . Migraine without status migrainosus, not intractable 09/19/2012  . Menopausal symptoms  06/12/2012  . Chronic low back pain 07/05/2011    Cynde Menard Nilda Simmer PT, MPH  08/05/2017, 1:08 PM  [facility] [place], [place], [address] Phone: [phone]   Fax:  [phone]  Name: Rashaun Curl MRN: [id number] Date of Birth: 1961/01/23  PHYSICAL THERAPY DISCHARGE SUMMARY  Visits from Start of Care: evaluation only  Current functional level related to goals / functional outcomes: See evaluation note   Remaining deficits: Unknown    Education / Equipment: HEP  Plan: Patient agrees to discharge.  Patient goals were not met. Patient is being discharged due to not returning since the last visit.  ?????    Deyton Ellenbecker P. Helene Kelp PT, MPH 08/17/17 12:48 PM

## 2017-08-05 NOTE — Addendum Note (Signed)
Addended by: Val RilesHOLT, Ismelda Weatherman P on: 08/05/2017 01:09 PM   Modules accepted: Orders

## 2017-08-08 ENCOUNTER — Encounter: Payer: Medicare HMO | Admitting: Rehabilitative and Restorative Service Providers"

## 2017-08-09 ENCOUNTER — Ambulatory Visit (INDEPENDENT_AMBULATORY_CARE_PROVIDER_SITE_OTHER): Payer: Medicare HMO | Admitting: Sports Medicine

## 2017-08-09 ENCOUNTER — Encounter: Payer: Self-pay | Admitting: Sports Medicine

## 2017-08-09 ENCOUNTER — Telehealth: Payer: Self-pay

## 2017-08-09 ENCOUNTER — Ambulatory Visit: Payer: Medicare HMO | Admitting: Sports Medicine

## 2017-08-09 DIAGNOSIS — M5412 Radiculopathy, cervical region: Secondary | ICD-10-CM | POA: Diagnosis not present

## 2017-08-09 DIAGNOSIS — M19011 Primary osteoarthritis, right shoulder: Secondary | ICD-10-CM

## 2017-08-09 MED ORDER — ACETAMINOPHEN-CODEINE #4 300-60 MG PO TABS
1.0000 | ORAL_TABLET | ORAL | 0 refills | Status: DC | PRN
Start: 2017-08-09 — End: 2020-12-15

## 2017-08-09 NOTE — Progress Notes (Signed)
Subjective:    CC: Recheck shoulder and neck  HPI: Right shoulder pain: Multiple pain generators including labral tearing, biceps anchor degeneration, glenohumeral osteoarthritis, I did an ultrasound-guided glenohumeral joint injection at the last visit that provided approximately 1 week of good relief, her pain has since crept back.  Neck pain: Multilevel cervical degenerative disc disease, still has some pain, axial as well as right periscapular but she does tell me she could increase her diligence with her rehab exercises.  I reviewed the past medical history, family history, social history, surgical history, and allergies today and no changes were needed.  Please see the problem list section below in epic for further details.  Past Medical History: Past Medical History:  Diagnosis Date  . Anxiety 03/08/2017  . Arthritis 03/08/2017  . Bipolar 1 disorder (HCC) 08/20/2015   Overview:  Dr. Chilton Si, [facility]. Now Dr Jordan Likes   Last Assessment & Plan:  Relevant Hx: Course: Daily Update: Today's Plan:  . Chronic low back pain 07/05/2011   Overview:  Dr. Karie Schwalbe. [facility]  . Gastroesophageal reflux disease without esophagitis 08/20/2015  . Insomnia 03/05/2013  . Migraine without status migrainosus, not intractable 09/19/2012  . Postmenopausal HRT (hormone replacement therapy) 08/20/2015   Past Surgical History: Past Surgical History:  Procedure Laterality Date  . CESAREAN SECTION     (2)   . CHOLECYSTECTOMY    . KNEE SURGERY Bilateral   . VAGINAL HYSTERECTOMY     Social History: Social History   Socioeconomic History  . Marital status: Married    Spouse name: Not on file  . Number of children: 2  . Years of education: Not on file  . Highest education level: Associate degree: academic program  Occupational History  . Not on file  Social Needs  . Financial resource strain: Not on file  . Food insecurity:    Worry: Not on file    Inability: Not on file  . Transportation  needs:    Medical: Not on file    Non-medical: Not on file  Tobacco Use  . Smoking status: Never Smoker  . Smokeless tobacco: Never Used  Substance and Sexual Activity  . Alcohol use: No    Frequency: Never  . Drug use: No  . Sexual activity: Yes    Partners: Male    Birth control/protection: None  Lifestyle  . Physical activity:    Days per week: 7 days    Minutes per session: Not on file  . Stress: Not on file  Relationships  . Social connections:    Talks on phone: Not on file    Gets together: Not on file    Attends religious service: Not on file    Active member of club or organization: Not on file    Attends meetings of clubs or organizations: Not on file    Relationship status: Not on file  Other Topics Concern  . Not on file  Social History Narrative  . Not on file   Family History: Family History  Problem Relation Age of Onset  . Cancer Mother   . Depression Father   . Diabetes Father   . Depression Sister   . Depression Brother   . Stroke Other   . GER disease Other    Allergies: Allergies  Allergen Reactions  . Other Itching, Nausea And Vomiting and Other (See Comments)    Patient states she has taken this safely before since the one episode of itching  Anti-inflammatory - affects  liver function Patient states she has taken this safely before since the one episode of itching  Anti-inflammatory - affects liver function  Patient states she has taken this safely before since the one episode of itching  Anti-inflammatory - affects liver function  . Red Dye Itching    Patient states she has taken this safely before since the one episode of itching    . Sulfa Antibiotics Anaphylaxis  . Sulfasalazine Anaphylaxis and Swelling  . Tramadol Nausea And Vomiting and Other (See Comments)    Headaches    . Ibuprofen Other (See Comments)    Affects liver functions   Medications: See med rec.  Review of Systems: No fevers, chills, night sweats, weight  loss, chest pain, or shortness of breath.   Objective:    General: Well Developed, well nourished, and in no acute distress.  Neuro: Alert and oriented x3, extra-ocular muscles intact, sensation grossly intact.  HEENT: Normocephalic, atraumatic, pupils equal round reactive to light, neck supple, no masses, no lymphadenopathy, thyroid nonpalpable.  Skin: Warm and dry, no rashes. Cardiac: Regular rate and rhythm, no murmurs rubs or gallops, no lower extremity edema.  Respiratory: Clear to auscultation bilaterally. Not using accessory muscles, speaking in full sentences.  Impression and Recommendations:    Primary osteoarthritis of right shoulder Glenohumeral arthritis, labral fraying, biceps anchor degeneration. We did an ultrasound-guided glenohumeral joint injection as above, she only had a week of relief. At this point she likely needs arthroscopy, labral debridement and biceps tenodesis. I think Dr. Dewaine OatsBrumfield would be a good man for the job. Tylenol with codeine in the meantime.  Right cervical radiculopathy Multiple nerve distribution paresthesias on the right with axial, as well as periscapular pain. MRI did show multilevel cervical degenerative disc disease with multiple levels of central and foraminal stenosis, we will do 1 more month of home physical therapy before considering a right C6-C7 interlaminar epidural.  I spent 25 minutes with this patient, greater than 50% was face-to-face time counseling regarding the above diagnoses ___________________________________________ Ihor Austinhomas J. Benjamin Stainhekkekandam, M.D., ABFM., CAQSM. Primary Care and Sports Medicine [facility]  Adjunct Instructor of Family Medicine  [employer]

## 2017-08-09 NOTE — Telephone Encounter (Signed)
[facility] pharmac[facility] at [facility], Malena PeerX for Tylenol #4 that was sent over exceeds the max amount for an initial fill per opioid guidelines that they must follow.   Nadine CountsBob sates that if "max of 5 per day" was added to the sig, he could fill the whole RX, because 5 per day is the limit (curent RX directions would allow her to take 6 per day).   Received verbal OK from Dr T to add "max of 5 per day to sig".  Called [facility] back and gave verbal OK to [employer] Enterprisesechnician Jenny.

## 2017-08-09 NOTE — Assessment & Plan Note (Signed)
Multiple nerve distribution paresthesias on the right with axial, as well as periscapular pain. MRI did show multilevel cervical degenerative disc disease with multiple levels of central and foraminal stenosis, we will do 1 more month of home physical therapy before considering a right C6-C7 interlaminar epidural.

## 2017-08-09 NOTE — Assessment & Plan Note (Signed)
Glenohumeral arthritis, labral fraying, biceps anchor degeneration. We did an ultrasound-guided glenohumeral joint injection as above, she only had a week of relief. At this point she likely needs arthroscopy, labral debridement and biceps tenodesis. I think Dr. Dewaine OatsBrumfield would be a good man for the job. Tylenol with codeine in the meantime.

## 2017-08-25 ENCOUNTER — Other Ambulatory Visit: Payer: Self-pay | Admitting: Osteopathic Medicine

## 2017-08-25 DIAGNOSIS — Z780 Asymptomatic menopausal state: Secondary | ICD-10-CM

## 2017-08-25 DIAGNOSIS — N951 Menopausal and female climacteric states: Secondary | ICD-10-CM

## 2017-08-25 DIAGNOSIS — Z79890 Hormone replacement therapy (postmenopausal): Secondary | ICD-10-CM

## 2017-08-30 ENCOUNTER — Ambulatory Visit: Payer: Medicare HMO | Admitting: Sports Medicine

## 2017-09-13 ENCOUNTER — Other Ambulatory Visit: Payer: Self-pay | Admitting: Osteopathic Medicine

## 2017-09-13 DIAGNOSIS — N951 Menopausal and female climacteric states: Secondary | ICD-10-CM

## 2017-09-13 DIAGNOSIS — Z79890 Hormone replacement therapy (postmenopausal): Secondary | ICD-10-CM

## 2017-09-13 DIAGNOSIS — Z780 Asymptomatic menopausal state: Secondary | ICD-10-CM

## 2017-09-22 ENCOUNTER — Encounter: Payer: Self-pay | Admitting: Osteopathic Medicine

## 2017-09-22 ENCOUNTER — Ambulatory Visit (INDEPENDENT_AMBULATORY_CARE_PROVIDER_SITE_OTHER): Payer: Medicare HMO | Admitting: Osteopathic Medicine

## 2017-09-22 VITALS — BP 117/80 | HR 75 | Temp 97.9°F | Wt 170.5 lb

## 2017-09-22 DIAGNOSIS — Z13220 Encounter for screening for lipoid disorders: Secondary | ICD-10-CM

## 2017-09-22 DIAGNOSIS — Z90710 Acquired absence of both cervix and uterus: Secondary | ICD-10-CM

## 2017-09-22 DIAGNOSIS — Z780 Asymptomatic menopausal state: Secondary | ICD-10-CM

## 2017-09-22 DIAGNOSIS — F319 Bipolar disorder, unspecified: Secondary | ICD-10-CM

## 2017-09-22 DIAGNOSIS — Z79890 Hormone replacement therapy (postmenopausal): Secondary | ICD-10-CM | POA: Diagnosis not present

## 2017-09-22 MED ORDER — ESTRADIOL 1 MG PO TABS: 1.0000 mg | ORAL_TABLET | Freq: Every day | ORAL | 3 refills | Status: DC

## 2017-09-22 NOTE — Progress Notes (Signed)
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 female who  has a past medical history of Anxiety (03/08/2017), Arthritis (03/08/2017), Bipolar 1 disorder (HCC) (08/20/2015), Chronic low back pain (07/05/2011), Gastroesophageal reflux disease without esophagitis (08/20/2015), Insomnia (03/05/2013), Migraine without status migrainosus, not intractable (09/19/2012), and Postmenopausal HRT (hormone replacement therapy) (08/20/2015).  [facility] today, 09/22/17,  for chief complaint of:  Refill medications  Following up on menopausal symptoms.  She is doing a lot better with the estrogen tablets.  Experiences hot flashes when off of this medicine.  We tried to also get topical estrogen covered for vaginal dryness/atrophy but this was prohibitively expensive.  Psychiatric: Patient is following with psychiatry.  Medication list was updated as below.  She is no longer taking the Lunesta.  Labs reviewed: We do not have any recent cholesterol panel on file, last metabolic panel demonstrated mildly decrease protein levels.  Past medical history, surgical history, and family history reviewed.  Current medication list and allergy/intolerance information reviewed.   (See remainder of HPI, ROS, Phys Exam below)    ASSESSMENT/PLAN: Doing well on current medications  Postmenopausal - Plan: estradiol (ESTRACE) 1 MG tablet, COMPLETE METABOLIC PANEL WITH GFR, Lipid panel  Postmenopausal HRT (hormone replacement therapy) - Discussed annual drug holiday to avoid unnecessary medicines, risks versus benefits discussed briefly - Plan: estradiol (ESTRACE) 1 MG tablet, COMPLETE METABOLIC PANEL WITH GFR, Lipid panel  Lipid screening - Plan: COMPLETE METABOLIC PANEL WITH GFR, Lipid panel  H/O hysterectomy for benign disease  Bipolar 1 disorder (HCC) - Following with psychiatry   Meds ordered this encounter  Medications  . estradiol (ESTRACE) 1 MG tablet    Sig: Take 1 tablet (1 mg  total) by mouth daily.    Dispense:  90 tablet    Refill:  3    Please consider 90 day supplies to promote better adherence     Follow-up plan: Return in about 1 year (around 09/23/2018) for annual physical w/ Dr Lyn HollingsheadAlexander, sooner if needed.   Coming back for fasting labs tomorrow, her husband is due for testosterone check then as well    ############################################ ############################################ ############################################ ############################################    Outpatient Encounter Medications as of 09/22/2017  Medication Sig  . Acetaminophen-Caffeine 500-65 MG TABS Take by mouth.  Marland Kitchen. acetaminophen-codeine (TYLENOL #4) 300-60 MG tablet Take 1 tablet by mouth every 4 (four) hours as needed for moderate pain.  . busPIRone (BUSPAR) 15 MG tablet   . celecoxib (CELEBREX) 200 MG capsule Take 1 capsule (200 mg total) by mouth 2 (two) times daily.  . cetirizine (ZYRTEC) 10 MG tablet Take by mouth.  . diclofenac sodium (VOLTAREN) 1 % GEL Apply 2-4 g topically 4 (four) times daily. To affected joint.  . DULoxetine (CYMBALTA) 60 MG capsule   . estradiol (ESTRACE VAGINAL) 0.1 MG/GM vaginal cream Place 1 Applicatorful vaginally at bedtime for 7 days, THEN 1 Applicatorful 3 (three) times a week.  . estradiol (ESTRACE) 1 MG tablet Take 1 tablet (1 mg total) by mouth daily. Pt needs f/u appt w/PCP for RFs  . Eszopiclone 3 MG TABS Take by mouth.  . Misc. Devices MISC Tri-Est SR 8:1:1 with methyltestosterone 0.625/1.25mg   . pantoprazole (PROTONIX) 20 MG tablet Take 1 tablet (20 mg total) by mouth daily.  . propranolol (INDERAL) 20 MG tablet Take 1 tablet (20 mg total) by mouth 2 (two) times daily.  . QUEtiapine (SEROQUEL) 50 MG tablet Take by mouth.   No facility-administered encounter medications  on file as of 09/22/2017.    Allergies  Allergen Reactions  . Other Itching, Nausea And Vomiting and Other (See Comments)    Patient states she  has taken this safely before since the one episode of itching  Anti-inflammatory - affects liver function Patient states she has taken this safely before since the one episode of itching  Anti-inflammatory - affects liver function  Patient states she has taken this safely before since the one episode of itching  Anti-inflammatory - affects liver function  . Red Dye Itching    Patient states she has taken this safely before since the one episode of itching    . Sulfa Antibiotics Anaphylaxis  . Sulfasalazine Anaphylaxis and Swelling  . Tramadol Nausea And Vomiting and Other (See Comments)    Headaches    . Ibuprofen Other (See Comments)    Affects liver functions      Review of Systems:  Constitutional: No recent illness  HEENT: No  headache, no vision change  Cardiac: No  chest pain, No  pressure, No palpitations  Respiratory:  No  shortness of breath  Hem/Onc: No  easy bruising/bleeding  Neurologic: No  weakness, No  Dizziness  Psychiatric: No  concerns with depression, No  concerns with anxiety  Exam:  BP 117/80 (BP Location: Left Arm, Patient Position: Sitting, Cuff Size: Normal)   Pulse 75   Temp 97.9 F (36.6 C) (Oral)   Wt 170 lb 8 oz (77.3 kg)   LMP  (LMP Unknown)   BMI 29.27 kg/m   Constitutional: VS see above. General Appearance: alert, well-developed, well-nourished, NAD  Eyes: Normal lids and conjunctive, non-icteric sclera  Ears, Nose, Mouth, Throat: MMM, Normal external inspection ears/nares/mouth/lips/gums.  Neck: No masses, trachea midline.   Respiratory: Normal respiratory effort. no wheeze, no rhonchi, no rales  Cardiovascular: S1/S2 normal, no murmur, no rub/gallop auscultated. RRR.   Musculoskeletal: Gait normal. Symmetric and independent movement of all extremities  Neurological: Normal balance/coordination. No tremor.  Skin: warm, dry, intact.   Psychiatric: Normal judgment/insight. Normal mood and affect. Oriented x3.   Visit  summary with medication list and pertinent instructions was printed for patient to review, advised to alert [place] if any changes needed. All questions at time of visit were answered - patient instructed to contact office with any additional concerns. ER/RTC precautions were reviewed with the patient and understanding verbalized.   Follow-up plan: Return in about 1 year (around 09/23/2018) for annual physical w/ Dr Lyn Hollingshead, sooner if needed.  Note: Total time spent 25 minutes, greater than 50% of the visit was spent face-to-face counseling and coordinating care for the following: The primary encounter diagnosis was Postmenopausal. Diagnoses of Postmenopausal HRT (hormone replacement therapy), Lipid screening, H/O hysterectomy for benign disease, and Bipolar 1 disorder (HCC) were also pertinent to this visit.Marland Kitchen  Please note: voice recognition software was used to produce this document, and typos may escape review. Please contact Dr. Lyn Hollingshead for any needed clarifications.

## 2017-11-02 ENCOUNTER — Other Ambulatory Visit: Payer: Self-pay | Admitting: Osteopathic Medicine

## 2017-11-02 DIAGNOSIS — K219 Gastro-esophageal reflux disease without esophagitis: Secondary | ICD-10-CM

## 2017-12-11 MED ORDER — GENERIC EXTERNAL MEDICATION
4.00 | Status: DC
Start: ? — End: 2017-12-11

## 2017-12-11 MED ORDER — OXYCODONE HCL 5 MG PO TABS
10.00 | ORAL_TABLET | ORAL | Status: DC
Start: ? — End: 2017-12-11

## 2017-12-11 MED ORDER — FAT EMULSION PLANT BASED 20 % IV EMUL
500.00 | INTRAVENOUS | Status: DC
Start: ? — End: 2017-12-11

## 2017-12-11 MED ORDER — ROPIVACAINE HCL-NACL 0.2-0.9 % IJ SOLN
550.00 | INTRAMUSCULAR | Status: DC
Start: ? — End: 2017-12-11

## 2017-12-11 MED ORDER — DOCUSATE SODIUM 100 MG PO CAPS
200.00 | ORAL_CAPSULE | ORAL | Status: DC
Start: 2017-12-10 — End: 2017-12-11

## 2017-12-11 MED ORDER — ACETAMINOPHEN 325 MG PO TABS
650.00 | ORAL_TABLET | ORAL | Status: DC
Start: 2017-12-09 — End: 2017-12-11

## 2017-12-11 MED ORDER — PANTOPRAZOLE SODIUM 20 MG PO TBEC
20.00 | DELAYED_RELEASE_TABLET | ORAL | Status: DC
Start: 2017-12-10 — End: 2017-12-11

## 2017-12-11 MED ORDER — MORPHINE SULFATE (PF) 4 MG/ML IV SOLN
4.00 | INTRAVENOUS | Status: DC
Start: ? — End: 2017-12-11

## 2017-12-11 MED ORDER — MELATONIN 1 MG PO TABS
2.00 | ORAL_TABLET | ORAL | Status: DC
Start: ? — End: 2017-12-11

## 2017-12-11 MED ORDER — BISACODYL 5 MG PO TBEC
5.00 | DELAYED_RELEASE_TABLET | ORAL | Status: DC
Start: ? — End: 2017-12-11

## 2017-12-11 MED ORDER — ASPIRIN BUF(CACARB-MGCARB-MGO) 325 MG PO TABS
325.00 | ORAL_TABLET | ORAL | Status: DC
Start: 2017-12-10 — End: 2017-12-11

## 2017-12-11 MED ORDER — ALUM & MAG HYDROXIDE-SIMETH 200-200-20 MG/5ML PO SUSP
30.00 | ORAL | Status: DC
Start: ? — End: 2017-12-11

## 2017-12-11 MED ORDER — BUSPIRONE HCL 5 MG PO TABS
15.00 | ORAL_TABLET | ORAL | Status: DC
Start: 2017-12-09 — End: 2017-12-11

## 2017-12-11 MED ORDER — QUETIAPINE FUMARATE 25 MG PO TABS
25.00 | ORAL_TABLET | ORAL | Status: DC
Start: 2017-12-09 — End: 2017-12-11

## 2017-12-11 MED ORDER — DIPHENHYDRAMINE HCL 25 MG PO CAPS
25.00 | ORAL_CAPSULE | ORAL | Status: DC
Start: ? — End: 2017-12-11

## 2017-12-11 MED ORDER — BENZOCAINE-MENTHOL 15-3.6 MG MT LOZG
1.00 | LOZENGE | OROMUCOSAL | Status: DC
Start: ? — End: 2017-12-11

## 2017-12-11 MED ORDER — LORATADINE 10 MG PO TABS
20.00 | ORAL_TABLET | ORAL | Status: DC
Start: 2017-12-10 — End: 2017-12-11

## 2017-12-11 MED ORDER — BISACODYL 10 MG RE SUPP
10.00 | RECTAL | Status: DC
Start: ? — End: 2017-12-11

## 2017-12-11 MED ORDER — OXYCODONE HCL 5 MG PO TABS
5.00 | ORAL_TABLET | ORAL | Status: DC
Start: ? — End: 2017-12-11

## 2017-12-11 MED ORDER — PROPRANOLOL HCL 10 MG PO TABS
20.00 | ORAL_TABLET | ORAL | Status: DC
Start: 2017-12-09 — End: 2017-12-11

## 2017-12-11 MED ORDER — DULOXETINE HCL 60 MG PO CPEP
120.00 | ORAL_CAPSULE | ORAL | Status: DC
Start: 2017-12-10 — End: 2017-12-11

## 2017-12-11 MED ORDER — MORPHINE SULFATE (PF) 2 MG/ML IV SOLN
2.00 | INTRAVENOUS | Status: DC
Start: ? — End: 2017-12-11

## 2017-12-11 MED ORDER — CELECOXIB 200 MG PO CAPS
200.00 | ORAL_CAPSULE | ORAL | Status: DC
Start: 2017-12-09 — End: 2017-12-11

## 2017-12-11 MED ORDER — GENERIC EXTERNAL MEDICATION
0.08 | Status: DC
Start: ? — End: 2017-12-11

## 2017-12-11 MED ORDER — GENERIC EXTERNAL MEDICATION
.04 | Status: DC
Start: ? — End: 2017-12-11

## 2018-01-17 ENCOUNTER — Other Ambulatory Visit: Payer: Self-pay | Admitting: Physician Assistant

## 2018-01-17 DIAGNOSIS — K219 Gastro-esophageal reflux disease without esophagitis: Secondary | ICD-10-CM

## 2018-01-27 ENCOUNTER — Other Ambulatory Visit: Payer: Self-pay

## 2018-01-27 DIAGNOSIS — K219 Gastro-esophageal reflux disease without esophagitis: Secondary | ICD-10-CM

## 2018-01-27 MED ORDER — PANTOPRAZOLE SODIUM 20 MG PO TBEC: 20.0000 mg | DELAYED_RELEASE_TABLET | Freq: Every day | ORAL | 1 refills | Status: DC

## 2018-04-25 ENCOUNTER — Other Ambulatory Visit: Payer: Self-pay

## 2018-04-25 DIAGNOSIS — G8929 Other chronic pain: Secondary | ICD-10-CM

## 2018-04-25 DIAGNOSIS — M545 Low back pain, unspecified: Secondary | ICD-10-CM

## 2018-04-25 DIAGNOSIS — M1990 Unspecified osteoarthritis, unspecified site: Secondary | ICD-10-CM

## 2018-04-25 MED ORDER — CELECOXIB 200 MG PO CAPS: 200.0000 mg | ORAL_CAPSULE | Freq: Two times a day (BID) | ORAL | 3 refills | Status: DC

## 2018-05-31 ENCOUNTER — Other Ambulatory Visit: Payer: Self-pay | Admitting: Osteopathic Medicine

## 2018-05-31 DIAGNOSIS — K219 Gastro-esophageal reflux disease without esophagitis: Secondary | ICD-10-CM

## 2018-06-22 ENCOUNTER — Telehealth: Payer: Self-pay | Admitting: Osteopathic Medicine

## 2018-06-22 ENCOUNTER — Other Ambulatory Visit: Payer: Self-pay

## 2018-06-22 DIAGNOSIS — Z79890 Hormone replacement therapy (postmenopausal): Secondary | ICD-10-CM

## 2018-06-22 DIAGNOSIS — Z780 Asymptomatic menopausal state: Secondary | ICD-10-CM

## 2018-06-22 DIAGNOSIS — K219 Gastro-esophageal reflux disease without esophagitis: Secondary | ICD-10-CM

## 2018-06-22 MED ORDER — ESTRADIOL 1 MG PO TABS: 1.0000 mg | ORAL_TABLET | Freq: Every day | ORAL | 1 refills | Status: DC

## 2018-06-22 MED ORDER — PANTOPRAZOLE SODIUM 20 MG PO TBEC: 20.0000 mg | DELAYED_RELEASE_TABLET | Freq: Every day | ORAL | 0 refills | Status: DC

## 2018-06-22 NOTE — Telephone Encounter (Signed)
Sent in

## 2018-06-22 NOTE — Telephone Encounter (Signed)
Copied from CRM [id number]. Topic: Quick Communication - Rx Refill/Question >> Jun 22, 2018  2:33 PM Tamela Oddi wrote: Medication: estradiol (ESTRACE) 1 MG tablet / pantoprazole (PROTONIX) 20 MG tablet  Patient called to request a refill for the above medications  Preferred Pharmacy (with phone number or street name): [facility] - [place], [place] - [address] [phone] (Phone) [phone] (Fax)

## 2018-07-05 DIAGNOSIS — F4310 Post-traumatic stress disorder, unspecified: Secondary | ICD-10-CM | POA: Insufficient documentation

## 2018-08-29 ENCOUNTER — Other Ambulatory Visit: Payer: Self-pay | Admitting: Osteopathic Medicine

## 2018-08-29 DIAGNOSIS — F419 Anxiety disorder, unspecified: Secondary | ICD-10-CM

## 2018-08-29 NOTE — Telephone Encounter (Signed)
Please advise 

## 2018-09-05 DIAGNOSIS — F3175 Bipolar disorder, in partial remission, most recent episode depressed: Secondary | ICD-10-CM | POA: Diagnosis not present

## 2018-09-05 DIAGNOSIS — F5101 Primary insomnia: Secondary | ICD-10-CM | POA: Diagnosis not present

## 2018-09-05 DIAGNOSIS — Z8659 Personal history of other mental and behavioral disorders: Secondary | ICD-10-CM | POA: Diagnosis not present

## 2018-09-05 DIAGNOSIS — F411 Generalized anxiety disorder: Secondary | ICD-10-CM | POA: Diagnosis not present

## 2018-09-05 DIAGNOSIS — F4310 Post-traumatic stress disorder, unspecified: Secondary | ICD-10-CM | POA: Diagnosis not present

## 2018-09-13 ENCOUNTER — Other Ambulatory Visit: Payer: Self-pay | Admitting: Family Medicine

## 2018-09-13 DIAGNOSIS — K219 Gastro-esophageal reflux disease without esophagitis: Secondary | ICD-10-CM

## 2018-09-26 ENCOUNTER — Other Ambulatory Visit: Payer: Self-pay | Admitting: Osteopathic Medicine

## 2018-09-26 DIAGNOSIS — K219 Gastro-esophageal reflux disease without esophagitis: Secondary | ICD-10-CM

## 2018-10-04 ENCOUNTER — Telehealth: Payer: Self-pay

## 2018-10-04 DIAGNOSIS — K219 Gastro-esophageal reflux disease without esophagitis: Secondary | ICD-10-CM

## 2018-10-04 MED ORDER — PANTOPRAZOLE SODIUM 20 MG PO TBEC: 20.0000 mg | DELAYED_RELEASE_TABLET | Freq: Every day | ORAL | 1 refills | Status: DC

## 2018-10-04 NOTE — Telephone Encounter (Signed)
Pt called requesting med refill for pantoprazole. Rx was written by external provider. Pt no longer seeing specialist. Having a hard time with controlling acid reflux symptoms. She has been out of med for some time. Requesting med refill be sent to [facility] in [place] on [address]

## 2018-10-04 NOTE — Telephone Encounter (Signed)
Sent!

## 2018-11-07 ENCOUNTER — Encounter: Payer: Self-pay | Admitting: Family Medicine

## 2018-11-07 ENCOUNTER — Ambulatory Visit (INDEPENDENT_AMBULATORY_CARE_PROVIDER_SITE_OTHER): Payer: Medicare HMO | Admitting: Family Medicine

## 2018-11-07 ENCOUNTER — Other Ambulatory Visit: Payer: Self-pay

## 2018-11-07 VITALS — BP 118/81 | HR 76 | Temp 98.1°F | Ht 62.0 in | Wt 175.0 lb

## 2018-11-07 DIAGNOSIS — B0230 Zoster ocular disease, unspecified: Secondary | ICD-10-CM | POA: Diagnosis not present

## 2018-11-07 MED ORDER — VALACYCLOVIR HCL 1 G PO TABS: 1000.0000 mg | ORAL_TABLET | Freq: Three times a day (TID) | ORAL | 0 refills | Status: AC

## 2018-11-07 MED ORDER — GABAPENTIN 300 MG PO CAPS: 300.0000 mg | ORAL_CAPSULE | Freq: Three times a day (TID) | ORAL | 0 refills | Status: DC | PRN

## 2018-11-07 NOTE — Progress Notes (Signed)
Amber Hanson is a 58 y.o. female who presents to [facility]: [facility] today for rash.  Patient developed a rash on her left forehead and scalp on Monday, September 14.  She developed a tingling in burning sensation associated with it.  She notes her left eye is a bit irritated with some increased tear production.  She denies any blurry vision.  She feels well otherwise with no fevers chills nausea vomiting or diarrhea.  She not tried any treatment yet.  She denies any new soap detergent or shampoo.  ROS as above:  Exam:  BP 118/81   Pulse 76   Temp 98.1 F (36.7 C) (Oral)   Ht 5\' 2"  (1.575 m)   Wt 175 lb (79.4 kg)   LMP  (LMP Unknown)   BMI 32.01 kg/m  Wt Readings from Last 5 Encounters:  11/07/18 175 lb (79.4 kg)  09/22/17 170 lb 8 oz (77.3 kg)  08/09/17 169 lb (76.7 kg)  07/19/17 167 lb 8 oz (76 kg)  06/28/17 166 lb (75.3 kg)    Gen: Well NAD HEENT: EOMI,  MMM no conjunctival injection Lungs: Normal work of breathing. CTABL Heart: RRR no MRG Abd: NABS, Soft. Nondistended, Nontender Exts: Brisk capillary refill, warm and well perfused.  Skin: Erythematous maculopapular rash scalp and left forehead.  No definite vesicles identified.  Lab and Radiology Results No results found for this or any previous visit (from the past 72 hour(s)). No results found.    Assessment and Plan: 58 y.o. female with rash left forehead and scalp concerning for shingles.  Appears to be V1 distribution which puts her at increased risk for zoster ophthalmicus.  Given her mild eye symptoms I think it is reasonable to have prompt referral to ophthalmology while proceeding with typical shingles treatment.  Use oral Valtrex.  Additionally use gabapentin for symptom management.  Recheck if not improving.  Cautioned against exposure to people at risk for chickenpox which is pregnant women  infants and immunocompromised individuals.  PDMP not reviewed this encounter. Orders Placed This Encounter  Procedures  . Ambulatory referral to Ophthalmology    Referral Priority:   Urgent    Referral Type:   Consultation    Referral Reason:   Specialty Services Required    Requested Specialty:   Ophthalmology    Number of Visits Requested:   1   Meds ordered this encounter  Medications  . valACYclovir (VALTREX) 1000 MG tablet    Sig: Take 1 tablet (1,000 mg total) by mouth 3 (three) times daily.    Dispense:  21 tablet    Refill:  0  . gabapentin (NEURONTIN) 300 MG capsule    Sig: Take 1 capsule (300 mg total) by mouth 3 (three) times daily as needed (nerve pain).    Dispense:  90 capsule    Refill:  0     Historical information moved to improve visibility of documentation.  Past Medical History:  Diagnosis Date  . Anxiety 03/08/2017  . Arthritis 03/08/2017  . Bipolar 1 disorder (HCC) 08/20/2015   Overview:  Dr. Chilton SiGreen, [facility]. Now Dr Jordan LikesPool   Last Assessment & Plan:  Relevant Hx: Course: Daily Update: Today's Plan:  . Chronic low back pain 07/05/2011   Overview:  Dr. Karie Schwalbe. [place]  . Gastroesophageal reflux disease without esophagitis 08/20/2015  . Insomnia 03/05/2013  . Migraine without status migrainosus, not intractable 09/19/2012  . Postmenopausal HRT (hormone replacement therapy)  08/20/2015   Past Surgical History:  Procedure Laterality Date  . CESAREAN SECTION     (2)   . CHOLECYSTECTOMY    . KNEE SURGERY Bilateral   . VAGINAL HYSTERECTOMY     Social History   Tobacco Use  . Smoking status: Never Smoker  . Smokeless tobacco: Never Used  Substance Use Topics  . Alcohol use: No    Frequency: Never   family history includes Cancer in her mother; Depression in her brother, father, and sister; Diabetes in her father; GER disease in an other family member; Stroke in an other family member.  Medications: Current Outpatient Medications  Medication  Sig Dispense Refill  . Acetaminophen-Caffeine 500-65 MG TABS Take by mouth.    Marland Kitchen acetaminophen-codeine (TYLENOL #4) 300-60 MG tablet Take 1 tablet by mouth every 4 (four) hours as needed for moderate pain. 30 tablet 0  . busPIRone (BUSPAR) 15 MG tablet     . celecoxib (CELEBREX) 200 MG capsule Take 1 capsule (200 mg total) by mouth 2 (two) times daily. 180 capsule 3  . cetirizine (ZYRTEC) 10 MG tablet Take by mouth.    . diclofenac sodium (VOLTAREN) 1 % GEL Apply 2-4 g topically 4 (four) times daily. To affected joint. 100 g 11  . DULoxetine (CYMBALTA) 60 MG capsule     . estradiol (ESTRACE) 1 MG tablet Take 1 tablet (1 mg total) by mouth daily. 90 tablet 1  . gabapentin (NEURONTIN) 300 MG capsule Take 1 capsule (300 mg total) by mouth 3 (three) times daily as needed (nerve pain). 90 capsule 0  . pantoprazole (PROTONIX) 20 MG tablet Take 1 tablet (20 mg total) by mouth daily. 90 tablet 1  . propranolol (INDERAL) 20 MG tablet TAKE 1 TABLET (20 MG TOTAL) BY MOUTH 2 (TWO) TIMES DAILY. 180 tablet 0  . QUEtiapine (SEROQUEL) 50 MG tablet Take by mouth.    . valACYclovir (VALTREX) 1000 MG tablet Take 1 tablet (1,000 mg total) by mouth 3 (three) times daily. 21 tablet 0   No current facility-administered medications for this visit.    Allergies  Allergen Reactions  . Other Itching, Nausea And Vomiting and Other (See Comments)    Patient states she has taken this safely before since the one episode of itching  Anti-inflammatory - affects liver function Patient states she has taken this safely before since the one episode of itching  Anti-inflammatory - affects liver function  Patient states she has taken this safely before since the one episode of itching  Anti-inflammatory - affects liver function  . Red Dye Itching    Patient states she has taken this safely before since the one episode of itching    . Sulfa Antibiotics Anaphylaxis  . Sulfasalazine Anaphylaxis and Swelling  . Tramadol Nausea  And Vomiting and Other (See Comments)    Headaches    . Ibuprofen Other (See Comments)    Affects liver functions     Discussed warning signs or symptoms. Please see discharge instructions. Patient expresses understanding.

## 2018-11-07 NOTE — Patient Instructions (Addendum)
Thank you for coming in today. I think this is shingles.  Start valtrex now. 1000mg  every 8 hours (3x daily).  I am concerned that it could go to the eye. You should hear from the the eye doctor soon about that.  Let me know if you do not hear anything soon.   Take gabapentin for nerve pain as needed up to 3x daily.  Keep me updated and recheck as needed.    Shingles  Shingles, which is also known as herpes zoster, is an infection that causes a painful skin rash and fluid-filled blisters. It is caused by a virus. Shingles only develops in people who:  Have had chickenpox.  Have been given a medicine to protect against chickenpox (have been vaccinated). Shingles is rare in this group. What are the causes? Shingles is caused by varicella-zoster virus (VZV). This is the same virus that causes chickenpox. After a person is exposed to VZV, the virus stays in the body in an inactive (dormant) state. Shingles develops if the virus is reactivated. This can happen many years after the first (initial) exposure to VZV. It is not known what causes this virus to be reactivated. What increases the risk? People who have had chickenpox or received the chickenpox vaccine are at risk for shingles. Shingles infection is more common in people who:  Are older than age 3.  Have a weakened disease-fighting system (immune system), such as people with: ? HIV. ? AIDS. ? Cancer.  Are taking medicines that weaken the immune system, such as transplant medicines.  Are experiencing a lot of stress. What are the signs or symptoms? Early symptoms of this condition include itching, tingling, and pain in an area on your skin. Pain may be described as burning, stabbing, or throbbing. A few days or weeks after early symptoms start, a painful red rash appears. The rash is usually on one side of the body and has a band-like or belt-like pattern. The rash eventually turns into fluid-filled blisters that break open,  change into scabs, and dry up in about 2-3 weeks. At any time during the infection, you may also develop:  A fever.  Chills.  A headache.  An upset stomach. How is this diagnosed? This condition is diagnosed with a skin exam. Skin or fluid samples may be taken from the blisters before a diagnosis is made. These samples are examined under a microscope or sent to a lab for testing. How is this treated? The rash may last for several weeks. There is not a specific cure for this condition. Your health care provider will probably prescribe medicines to help you manage pain, recover more quickly, and avoid long-term problems. Medicines may include:  Antiviral drugs.  Anti-inflammatory drugs.  Pain medicines.  Anti-itching medicines (antihistamines). If the area involved is on your face, you may be referred to a specialist, such as an eye doctor (ophthalmologist) or an ear, nose, and throat (ENT) doctor (otolaryngologist) to help you avoid eye problems, chronic pain, or disability. Follow these instructions at home: Medicines  Take over-the-counter and prescription medicines only as told by your health care provider.  Apply an anti-itch cream or numbing cream to the affected area as told by your health care provider. Relieving itching and discomfort   Apply cold, wet cloths (cold compresses) to the area of the rash or blisters as told by your health care provider.  Cool baths can be soothing. Try adding baking soda or dry oatmeal to the water to reduce itching.  Do not bathe in hot water. Blister and rash care  Keep your rash covered with a loose bandage (dressing). Wear loose-fitting clothing to help ease the pain of material rubbing against the rash.  Keep your rash and blisters clean by washing the area with mild soap and cool water as told by your health care provider.  Check your rash every day for signs of infection. Check for: ? More redness, swelling, or pain. ? Fluid or  blood. ? Warmth. ? Pus or a bad smell.  Do not scratch your rash or pick at your blisters. To help avoid scratching: ? Keep your fingernails clean and cut short. ? Wear gloves or mittens while you sleep, if scratching is a problem. General instructions  Rest as told by your health care provider.  Keep all follow-up visits as told by your health care provider. This is important.  Wash your hands often with soap and water. If soap and water are not available, use hand sanitizer. Doing this lowers your chance of getting a bacterial skin infection.  Before your blisters change into scabs, your shingles infection can cause chickenpox in people who have never had it or have never been vaccinated against it. To prevent this from happening, avoid contact with other people, especially: ? Babies. ? Pregnant women. ? Children who have eczema. ? Elderly people who have transplants. ? People who have chronic illnesses, such as cancer or AIDS. Contact a health care provider if:  Your pain is not relieved with prescribed medicines.  Your pain does not get better after the rash heals.  You have signs of infection in the rash area, such as: ? More redness, swelling, or pain around the rash. ? Fluid or blood coming from the rash. ? The rash area feeling warm to the touch. ? Pus or a bad smell coming from the rash. Get help right away if:  The rash is on your face or nose.  You have facial pain, pain around your eye area, or loss of feeling on one side of your face.  You have difficulty seeing.  You have ear pain or have ringing in your ear.  You have a loss of taste.  Your condition gets worse. Summary  Shingles, which is also known as herpes zoster, is an infection that causes a painful skin rash and fluid-filled blisters.  This condition is diagnosed with a skin exam. Skin or fluid samples may be taken from the blisters and examined before the diagnosis is made.  Keep your rash  covered with a loose bandage (dressing). Wear loose-fitting clothing to help ease the pain of material rubbing against the rash.  Before your blisters change into scabs, your shingles infection can cause chickenpox in people who have never had it or have never been vaccinated against it. This information is not intended to replace advice given to you by your health care provider. Make sure you discuss any questions you have with your health care provider. Document Released: 02/08/2005 Document Revised: 06/02/2018 Document Reviewed: 10/13/2016 Elsevier Patient Education  2020 [employer]

## 2018-11-09 DIAGNOSIS — B029 Zoster without complications: Secondary | ICD-10-CM | POA: Diagnosis not present

## 2018-11-09 DIAGNOSIS — H0100B Unspecified blepharitis left eye, upper and lower eyelids: Secondary | ICD-10-CM | POA: Diagnosis not present

## 2018-11-09 DIAGNOSIS — H2513 Age-related nuclear cataract, bilateral: Secondary | ICD-10-CM | POA: Diagnosis not present

## 2018-11-09 DIAGNOSIS — H0100A Unspecified blepharitis right eye, upper and lower eyelids: Secondary | ICD-10-CM | POA: Diagnosis not present

## 2018-11-13 ENCOUNTER — Other Ambulatory Visit: Payer: Self-pay | Admitting: Family Medicine

## 2018-11-13 ENCOUNTER — Other Ambulatory Visit: Payer: Self-pay | Admitting: Osteopathic Medicine

## 2018-11-13 DIAGNOSIS — F419 Anxiety disorder, unspecified: Secondary | ICD-10-CM

## 2018-11-13 DIAGNOSIS — Z780 Asymptomatic menopausal state: Secondary | ICD-10-CM

## 2018-11-13 DIAGNOSIS — Z79890 Hormone replacement therapy (postmenopausal): Secondary | ICD-10-CM

## 2018-11-13 NOTE — Telephone Encounter (Signed)
Requested medication (s) are due for refill today: yes  Requested medication (s) are on the active medication list: yes  Last refill: 08/31/2018  Future visit scheduled: no  Notes to clinic:  Review for refill   Requested Prescriptions  Pending Prescriptions Disp Refills   propranolol (INDERAL) 20 MG tablet [Pharmacy Med Name: PROPRANOLOL HYDROCHLORIDE 20 MG Tablet] 180 tablet 0    Sig: TAKE 1 TABLET (20 MG TOTAL) BY MOUTH 2 (TWO) TIMES DAILY.     Cardiovascular:  Beta Blockers Failed - 11/13/2018  3:09 AM      Failed - Valid encounter within last 6 months    Recent Outpatient Visits          6 days ago Herpes zoster with ophthalmic complication, unspecified herpes zoster eye disease   [facility], Rebekah Chesterfield, MD   1 year ago Postmenopausal   [facility], Lanelle Bal, DO   1 year ago Primary osteoarthritis of right shoulder   [facility] [facility], Gwen Her, MD   1 year ago [place], Lanelle Bal, DO   1 year ago Primary osteoarthritis of right shoulder   [place] [facility], Gwen Her, MD             Passed - Last BP in normal range    BP Readings from Last 1 Encounters:  11/07/18 118/81         Passed - Last Heart Rate in normal range    Pulse Readings from Last 1 Encounters:  11/07/18 76

## 2018-11-13 NOTE — Telephone Encounter (Signed)
Requested medication (s) are due for refill today: yes  Requested medication (s) are on the active medication list: yes  Last refill:  08/30/2018  Future visit scheduled: no  Notes to clinic:  Review for refill   Requested Prescriptions  Pending Prescriptions Disp Refills   estradiol (ESTRACE) 1 MG tablet [Pharmacy Med Name: ESTRADIOL 1 MG Tablet] 90 tablet 1    Sig: TAKE 1 TABLET EVERY DAY     OB/GYN:  Estrogens Failed - 11/13/2018  3:09 AM      Failed - Mammogram is up-to-date per Health Maintenance      Failed - Valid encounter within last 12 months    Recent Outpatient Visits          6 days ago Herpes zoster with ophthalmic complication, unspecified herpes zoster eye disease   [place] [facility], Rebekah Chesterfield, MD   1 year ago Postmenopausal   [facility], Lanelle Bal, DO   1 year ago Primary osteoarthritis of right shoulder   [facility], Gwen Her, MD   1 year ago [facility], Lanelle Bal, DO   1 year ago Primary osteoarthritis of right shoulder   [place] [facility] [facility], Gwen Her, MD             Passed - Last BP in normal range    BP Readings from Last 1 Encounters:  11/07/18 118/81

## 2018-11-14 ENCOUNTER — Telehealth: Payer: Self-pay | Admitting: Family Medicine

## 2018-11-14 NOTE — Telephone Encounter (Signed)
Received documentation from [facility].  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  Fortunately patient did not have any concerning findings on exam and no specific treatment was recommended aside from watchful waiting.  Recommend follow-up in 1 month with ophthalmology for recheck.  Precautions reviewed.  Note will be sent to scan.

## 2018-11-22 ENCOUNTER — Other Ambulatory Visit: Payer: Self-pay

## 2018-11-22 ENCOUNTER — Ambulatory Visit (INDEPENDENT_AMBULATORY_CARE_PROVIDER_SITE_OTHER): Payer: Medicare HMO | Admitting: Sports Medicine

## 2018-11-22 DIAGNOSIS — Z23 Encounter for immunization: Secondary | ICD-10-CM | POA: Diagnosis not present

## 2018-11-27 ENCOUNTER — Telehealth: Payer: Self-pay | Admitting: Sports Medicine

## 2018-11-27 NOTE — Telephone Encounter (Signed)
Called and spoke with patient to get her mammogram.  Patient stated she would call Imaging [facility]) and get it scheduled.

## 2018-12-14 ENCOUNTER — Other Ambulatory Visit: Payer: Self-pay | Admitting: Osteopathic Medicine

## 2018-12-14 DIAGNOSIS — Z1231 Encounter for screening mammogram for malignant neoplasm of breast: Secondary | ICD-10-CM

## 2018-12-27 ENCOUNTER — Other Ambulatory Visit: Payer: Self-pay | Admitting: Osteopathic Medicine

## 2018-12-27 DIAGNOSIS — F419 Anxiety disorder, unspecified: Secondary | ICD-10-CM

## 2018-12-28 ENCOUNTER — Other Ambulatory Visit: Payer: Self-pay | Admitting: Osteopathic Medicine

## 2018-12-28 DIAGNOSIS — Z780 Asymptomatic menopausal state: Secondary | ICD-10-CM

## 2018-12-28 DIAGNOSIS — Z79890 Hormone replacement therapy (postmenopausal): Secondary | ICD-10-CM

## 2019-01-04 ENCOUNTER — Ambulatory Visit: Payer: Medicare HMO

## 2019-01-15 DIAGNOSIS — F5101 Primary insomnia: Secondary | ICD-10-CM | POA: Diagnosis not present

## 2019-01-15 DIAGNOSIS — F411 Generalized anxiety disorder: Secondary | ICD-10-CM | POA: Diagnosis not present

## 2019-01-15 DIAGNOSIS — Z658 Other specified problems related to psychosocial circumstances: Secondary | ICD-10-CM | POA: Diagnosis not present

## 2019-01-15 DIAGNOSIS — R5383 Other fatigue: Secondary | ICD-10-CM | POA: Diagnosis not present

## 2019-01-15 DIAGNOSIS — F3175 Bipolar disorder, in partial remission, most recent episode depressed: Secondary | ICD-10-CM | POA: Diagnosis not present

## 2019-01-15 DIAGNOSIS — F4310 Post-traumatic stress disorder, unspecified: Secondary | ICD-10-CM | POA: Diagnosis not present

## 2019-01-15 DIAGNOSIS — Z8659 Personal history of other mental and behavioral disorders: Secondary | ICD-10-CM | POA: Diagnosis not present

## 2019-01-31 ENCOUNTER — Other Ambulatory Visit: Payer: Self-pay

## 2019-01-31 ENCOUNTER — Ambulatory Visit (INDEPENDENT_AMBULATORY_CARE_PROVIDER_SITE_OTHER): Payer: Medicare HMO

## 2019-01-31 DIAGNOSIS — Z1231 Encounter for screening mammogram for malignant neoplasm of breast: Secondary | ICD-10-CM | POA: Diagnosis not present

## 2019-02-07 ENCOUNTER — Other Ambulatory Visit: Payer: Self-pay | Admitting: Osteopathic Medicine

## 2019-02-07 DIAGNOSIS — F419 Anxiety disorder, unspecified: Secondary | ICD-10-CM

## 2019-02-07 NOTE — Telephone Encounter (Signed)
Must make appointment 

## 2019-02-07 NOTE — Telephone Encounter (Signed)
Requested medication (s) are due for refill today: yes  Requested medication (s) are on the active medication list: yes  Last refill:12/27/2018  Future visit scheduled: no  Notes to clinic: review for refill   Requested Prescriptions  Pending Prescriptions Disp Refills   propranolol (INDERAL) 20 MG tablet [Pharmacy Med Name: PROPRANOLOL HYDROCHLORIDE 20 MG Tablet] 90 tablet 0    Sig: TAKE 1 TABLET TWICE DAILY (NEED MD APPOINTMENT)      Cardiovascular:  Beta Blockers Failed - 02/07/2019  3:29 AM      Failed - Valid encounter within last 6 months    Recent Outpatient Visits           3 months ago Herpes zoster with ophthalmic complication, unspecified herpes zoster eye disease   [facility] [facility], Rebekah Chesterfield, MD   1 year ago Postmenopausal   [facility], Lanelle Bal, DO   1 year ago Primary osteoarthritis of right shoulder   [place] [facility], Gwen Her, MD   1 year ago [place], Lanelle Bal, DO   1 year ago Primary osteoarthritis of right shoulder   [place] [facility], Gwen Her, MD              Passed - Last BP in normal range    BP Readings from Last 1 Encounters:  11/07/18 118/81          Passed - Last Heart Rate in normal range    Pulse Readings from Last 1 Encounters:  11/07/18 76

## 2019-03-11 ENCOUNTER — Other Ambulatory Visit: Payer: Self-pay | Admitting: Osteopathic Medicine

## 2019-03-11 DIAGNOSIS — K219 Gastro-esophageal reflux disease without esophagitis: Secondary | ICD-10-CM

## 2019-03-12 ENCOUNTER — Telehealth: Payer: Self-pay

## 2019-03-12 NOTE — Telephone Encounter (Signed)
Patient called after hours line for refills on medications. Patient is due for OV. Please call and get her scheduled.   Thanks!

## 2019-03-14 ENCOUNTER — Ambulatory Visit: Payer: Medicare HMO | Admitting: Osteopathic Medicine

## 2019-04-09 ENCOUNTER — Other Ambulatory Visit: Payer: Self-pay | Admitting: Osteopathic Medicine

## 2019-04-09 DIAGNOSIS — K219 Gastro-esophageal reflux disease without esophagitis: Secondary | ICD-10-CM

## 2019-05-10 DIAGNOSIS — F3176 Bipolar disorder, in full remission, most recent episode depressed: Secondary | ICD-10-CM | POA: Insufficient documentation

## 2019-05-10 DIAGNOSIS — Z8659 Personal history of other mental and behavioral disorders: Secondary | ICD-10-CM | POA: Diagnosis not present

## 2019-05-10 DIAGNOSIS — G2401 Drug induced subacute dyskinesia: Secondary | ICD-10-CM | POA: Insufficient documentation

## 2019-05-10 DIAGNOSIS — F4310 Post-traumatic stress disorder, unspecified: Secondary | ICD-10-CM | POA: Diagnosis not present

## 2019-05-10 DIAGNOSIS — Z658 Other specified problems related to psychosocial circumstances: Secondary | ICD-10-CM | POA: Diagnosis not present

## 2019-05-10 DIAGNOSIS — R5383 Other fatigue: Secondary | ICD-10-CM | POA: Diagnosis not present

## 2019-05-10 DIAGNOSIS — F5101 Primary insomnia: Secondary | ICD-10-CM | POA: Diagnosis not present

## 2019-05-10 DIAGNOSIS — F411 Generalized anxiety disorder: Secondary | ICD-10-CM | POA: Diagnosis not present

## 2019-05-31 ENCOUNTER — Other Ambulatory Visit: Payer: Self-pay | Admitting: Nurse Practitioner

## 2019-05-31 DIAGNOSIS — K219 Gastro-esophageal reflux disease without esophagitis: Secondary | ICD-10-CM

## 2019-05-31 MED ORDER — PANTOPRAZOLE SODIUM 20 MG PO TBEC: 20.0000 mg | DELAYED_RELEASE_TABLET | Freq: Every day | ORAL | 1 refills | Status: DC

## 2019-08-30 DIAGNOSIS — Z8659 Personal history of other mental and behavioral disorders: Secondary | ICD-10-CM | POA: Diagnosis not present

## 2019-08-30 DIAGNOSIS — F411 Generalized anxiety disorder: Secondary | ICD-10-CM | POA: Diagnosis not present

## 2019-08-30 DIAGNOSIS — F5101 Primary insomnia: Secondary | ICD-10-CM | POA: Diagnosis not present

## 2019-08-30 DIAGNOSIS — R5383 Other fatigue: Secondary | ICD-10-CM | POA: Diagnosis not present

## 2019-08-30 DIAGNOSIS — Z658 Other specified problems related to psychosocial circumstances: Secondary | ICD-10-CM | POA: Diagnosis not present

## 2019-08-30 DIAGNOSIS — F4310 Post-traumatic stress disorder, unspecified: Secondary | ICD-10-CM | POA: Diagnosis not present

## 2019-08-30 DIAGNOSIS — F3176 Bipolar disorder, in full remission, most recent episode depressed: Secondary | ICD-10-CM | POA: Diagnosis not present

## 2019-10-04 ENCOUNTER — Telehealth: Payer: Self-pay

## 2019-10-04 DIAGNOSIS — M1990 Unspecified osteoarthritis, unspecified site: Secondary | ICD-10-CM

## 2019-10-04 DIAGNOSIS — G8929 Other chronic pain: Secondary | ICD-10-CM

## 2019-10-04 DIAGNOSIS — M545 Low back pain, unspecified: Secondary | ICD-10-CM

## 2019-10-04 MED ORDER — CELECOXIB 200 MG PO CAPS: 200.0000 mg | ORAL_CAPSULE | Freq: Two times a day (BID) | ORAL | 3 refills | Status: DC

## 2019-10-04 NOTE — Telephone Encounter (Signed)
Pt called requesting Celebrex for arthritis pain. States she is unable to come in due to husband being in hospice and preparations for his passing.

## 2019-10-17 ENCOUNTER — Other Ambulatory Visit: Payer: Self-pay | Admitting: Physician Assistant

## 2019-10-17 DIAGNOSIS — N390 Urinary tract infection, site not specified: Secondary | ICD-10-CM

## 2019-10-17 DIAGNOSIS — Z79890 Hormone replacement therapy (postmenopausal): Secondary | ICD-10-CM

## 2019-10-23 ENCOUNTER — Other Ambulatory Visit: Payer: Self-pay

## 2019-10-23 DIAGNOSIS — N390 Urinary tract infection, site not specified: Secondary | ICD-10-CM

## 2019-10-23 DIAGNOSIS — Z79890 Hormone replacement therapy (postmenopausal): Secondary | ICD-10-CM

## 2019-10-23 MED ORDER — ESTROGENS, CONJUGATED 0.625 MG/GM VA CREA: 1.0000 | TOPICAL_CREAM | Freq: Every day | VAGINAL | 5 refills | Status: DC

## 2019-10-25 ENCOUNTER — Telehealth: Payer: Self-pay

## 2019-10-25 MED ORDER — ESTRADIOL 10 MCG VA TABS: ORAL_TABLET | VAGINAL | 0 refills | Status: DC

## 2019-10-25 NOTE — Telephone Encounter (Signed)
The Premarin cream cost $200. She would like the prescription switched to Vagifem. Please advise.

## 2019-10-30 ENCOUNTER — Encounter: Payer: Self-pay | Admitting: Osteopathic Medicine

## 2019-11-16 DIAGNOSIS — M25561 Pain in right knee: Secondary | ICD-10-CM | POA: Diagnosis not present

## 2019-11-16 DIAGNOSIS — M1711 Unilateral primary osteoarthritis, right knee: Secondary | ICD-10-CM | POA: Diagnosis not present

## 2019-12-03 ENCOUNTER — Other Ambulatory Visit: Payer: Self-pay | Admitting: Nurse Practitioner

## 2019-12-03 DIAGNOSIS — K219 Gastro-esophageal reflux disease without esophagitis: Secondary | ICD-10-CM

## 2019-12-07 ENCOUNTER — Other Ambulatory Visit (INDEPENDENT_AMBULATORY_CARE_PROVIDER_SITE_OTHER): Payer: Medicare HMO | Admitting: Neurology

## 2019-12-07 NOTE — Telephone Encounter (Signed)
Patient left vm asking if there was an option for a vaginal cream instead of a tablet that she can use that is affordable. She is currently using Estradiol 1 mg tablet daily and feels like this isn't working well for her. Please advise.

## 2019-12-10 NOTE — Telephone Encounter (Signed)
Called patient, states she already tried premarin but it was too expensive with her insurance, she is wanting estrace send in to pharmacy.

## 2019-12-10 NOTE — Telephone Encounter (Signed)
Creams are available but are often not covered. Would have her call insurance and see if they cover Estrace or Premarin. If they do NOT cover them, we may be able to get a prior authorization. Estring (ESS'- STRING) is also an option - vaginal insert to leave in place for 3 months.

## 2019-12-10 NOTE — Telephone Encounter (Signed)
rx pended Please confirm pharmacy and can send  5 mins spent by self and staff billed appropriately

## 2019-12-10 NOTE — Telephone Encounter (Signed)
Patient called again, wanting to know update. She needs refill and tablets are too expensive.

## 2019-12-11 MED ORDER — ESTRADIOL 0.1 MG/GM VA CREA: TOPICAL_CREAM | VAGINAL | 12 refills | Status: AC

## 2019-12-11 NOTE — Telephone Encounter (Signed)
Called and left msg advising patient RX sent to [facility]

## 2019-12-11 NOTE — Addendum Note (Signed)
Addended by: Deirdre Pippins on: 12/11/2019 04:45 PM   Modules accepted: Orders

## 2019-12-26 ENCOUNTER — Other Ambulatory Visit: Payer: Self-pay | Admitting: Osteopathic Medicine

## 2019-12-26 DIAGNOSIS — Z780 Asymptomatic menopausal state: Secondary | ICD-10-CM

## 2019-12-26 DIAGNOSIS — Z79890 Hormone replacement therapy (postmenopausal): Secondary | ICD-10-CM

## 2019-12-26 NOTE — Telephone Encounter (Signed)
Pt is requesting med refill for estradiol. Rx not listed in pt's chart.

## 2019-12-28 NOTE — Telephone Encounter (Signed)
Can we check with patient on this, I believe she was requesting an alternative estrogen and there were some issues getting coverage, I am happy to refill it but just wanted to see where we are at with the other prescription

## 2019-12-31 DIAGNOSIS — M1711 Unilateral primary osteoarthritis, right knee: Secondary | ICD-10-CM | POA: Diagnosis not present

## 2019-12-31 NOTE — Telephone Encounter (Signed)
Pt returned a call back regarding refill for estradiol (tablet). She is taking the oral rx. Please send refill to [facility] pharmacy.

## 2020-02-18 ENCOUNTER — Ambulatory Visit (INDEPENDENT_AMBULATORY_CARE_PROVIDER_SITE_OTHER): Payer: Medicare HMO | Admitting: Osteopathic Medicine

## 2020-02-18 VITALS — Ht 64.0 in | Wt 145.0 lb

## 2020-02-18 DIAGNOSIS — Z0000 Encounter for general adult medical examination without abnormal findings: Secondary | ICD-10-CM | POA: Diagnosis not present

## 2020-02-18 NOTE — Progress Notes (Addendum)
MEDICARE ANNUAL WELLNESS VISIT  02/18/2020  Telephone Visit Disclaimer This Medicare AWV was conducted by telephone due to national recommendations for restrictions regarding the COVID-19 Pandemic (e.g. social distancing).  I verified, using two identifiers, that I am speaking with Amber Hanson or their authorized healthcare agent. I discussed the limitations, risks, security, and privacy concerns of performing an evaluation and management service by telephone and the potential availability of an in-person appointment in the future. The patient expressed understanding and agreed to proceed.  Location of Patient: Home Location of Provider (nurse):  In the office.  Subjective:    Amber Hanson is a 59 y.o. female patient of Amber Nielsen, DO who had a Medicare Annual Wellness Visit today via telephone. Sherl is Working part time at [employer] and lives alone since her husband recently passed away. She has 2 children. She reports that she is socially active and does interact with friends/family regularly. She is minimally physically active and enjoys reading.  Patient Care Team: Amber Nielsen, DO as PCP - General (Osteopathic Medicine)  Advanced Directives 02/18/2020 08/05/2017  Does Patient Have a Medical Advance Directive? No No  Would patient like information on creating a medical advance directive? No - Patient declined No - Patient declined    Hospital Utilization Over the Past 12 Months: # of hospitalizations or ER visits: 0 # of surgeries: 0  Review of Systems    Patient reports that her overall health is worse compared to last year.   History obtained from chart review and the patient  Patient Reported Readings (BP, Pulse, CBG, Weight, etc) Weight: 145 lbs  Height: 44f4in  Pain Assessment Pain : No/denies pain     Current Medications & Allergies (verified) Allergies as of 02/18/2020      Reactions   Other Itching, Nausea And Vomiting, Other (See Comments)    Patient states she has taken this safely before since the one episode of itching  Anti-inflammatory - affects liver function Patient states she has taken this safely before since the one episode of itching  Anti-inflammatory - affects liver function Patient states she has taken this safely before since the one episode of itching  Anti-inflammatory - affects liver function   Red Dye Itching   Patient states she has taken this safely before since the one episode of itching    Sulfa Antibiotics Anaphylaxis   Sulfasalazine Anaphylaxis, Swelling   Tramadol Nausea And Vomiting, Other (See Comments)   Headaches   Ibuprofen Other (See Comments)   Affects liver functions      Medication List       Accurate as of February 18, 2020  2:54 PM. If you have any questions, ask your nurse or doctor.        Acetaminophen-Caffeine 500-65 MG Tabs Take by mouth.   acetaminophen-codeine 300-60 MG tablet Commonly known as: TYLENOL #4 Take 1 tablet by mouth every 4 (four) hours as needed for moderate pain.   busPIRone 15 MG tablet Commonly known as: BUSPAR   celecoxib 200 MG capsule Commonly known as: CELEBREX Take 1 capsule (200 mg total) by mouth 2 (two) times daily.   cetirizine 10 MG tablet Commonly known as: ZYRTEC Take by mouth.   diclofenac sodium 1 % Gel Commonly known as: VOLTAREN Apply 2-4 g topically 4 (four) times daily. To affected joint.   DULoxetine 60 MG capsule Commonly known as: CYMBALTA   estradiol 1 MG tablet Commonly known as: ESTRACE Take 1 tablet by mouth once daily  gabapentin 300 MG capsule Commonly known as: NEURONTIN Take 1 capsule (300 mg total) by mouth 3 (three) times daily as needed (nerve pain).   pantoprazole 20 MG tablet Commonly known as: PROTONIX Take 1 tablet (20 mg total) by mouth daily.   propranolol 20 MG tablet Commonly known as: INDERAL TAKE 1 TABLET TWICE DAILY (NEED MD APPOINTMENT)   QUEtiapine 50 MG tablet Commonly known as:  SEROQUEL Take by mouth.   QUEtiapine 25 MG tablet Commonly known as: SEROQUEL   triamcinolone acetonide 40 MG/ML injection (RADIOLOGY ONLY) Commonly known as: KENALOG-40 Inject into the articular space.   valACYclovir 1000 MG tablet Commonly known as: VALTREX Take 1 tablet (1,000 mg total) by mouth 3 (three) times daily.       History (reviewed): Past Medical History:  Diagnosis Date  . Anxiety 03/08/2017  . Arthritis 03/08/2017  . Bipolar 1 disorder (HCC) 08/20/2015   Overview:  Dr. Chilton Si, [facility]. Now Dr Jordan Likes   Last Assessment & Plan:  Relevant Hx: Course: Daily Update: Today's Plan:  . Chronic low back pain 07/05/2011   Overview:  Dr. Karie Schwalbe. [facility]  . Gastroesophageal reflux disease without esophagitis 08/20/2015  . Insomnia 03/05/2013  . Migraine without status migrainosus, not intractable 09/19/2012  . Postmenopausal HRT (hormone replacement therapy) 08/20/2015   Past Surgical History:  Procedure Laterality Date  . CESAREAN SECTION     (2)   . CHOLECYSTECTOMY    . KNEE SURGERY Bilateral   . VAGINAL HYSTERECTOMY     Family History  Problem Relation Age of Onset  . Cancer Mother   . Depression Father   . Diabetes Father   . Depression Sister   . Depression Brother   . Stroke Other   . GER disease Other    Social History   Socioeconomic History  . Marital status: Widowed    Spouse name: Not on file  . Number of children: 2  . Years of education: Not on file  . Highest education level: Associate degree: academic program  Occupational History  . Occupation: [employer]    Comment: Working part time  Tobacco Use  . Smoking status: Never Smoker  . Smokeless tobacco: Never Used  Vaping Use  . Vaping Use: Never used  Substance and Sexual Activity  . Alcohol use: No  . Drug use: No  . Sexual activity: Not Currently    Partners: Male    Birth control/protection: None  Other Topics Concern  . Not on file  Social History Narrative  . Not on file    Social Determinants of Health   Financial Resource Strain: Low Risk   . Difficulty of Paying Living Expenses: Not hard at all  Food Insecurity: No Food Insecurity  . Worried About Programme researcher, broadcasting/film/video in the Last Year: Never true  . Ran Out of Food in the Last Year: Never true  Transportation Needs: No Transportation Needs  . Lack of Transportation (Medical): No  . Lack of Transportation (Non-Medical): No  Physical Activity: Inactive  . Days of Exercise per Week: 0 days  . Minutes of Exercise per Session: 0 min  Stress: No Stress Concern Present  . Feeling of Stress : Only a little  Social Connections: Socially Isolated  . Frequency of Communication with Friends and Family: More than three times a week  . Frequency of Social Gatherings with Friends and Family: Once a week  . Attends Religious Services: Never  . Active Member of Clubs or Organizations: No  .  Attends BankerClub or Organization Meetings: Never  . Marital Status: Widowed    Activities of Daily Living In your present state of health, do you have any difficulty performing the following activities: 02/18/2020  Hearing? N  Vision? N  Difficulty concentrating or making decisions? N  Walking or climbing stairs? N  Dressing or bathing? N  Doing errands, shopping? N  Preparing Food and eating ? N  Using the Toilet? N  In the past six months, have you accidently leaked urine? N  Do you have problems with loss of bowel control? N  Managing your Medications? N  Housekeeping or managing your Housekeeping? N  Some recent data might be hidden    Patient Education/ Literacy How often do you need to have someone help you when you read instructions, pamphlets, or other written materials from your doctor or pharmacy?: 1 - Never What is the last grade level you completed in school?: Some college  Exercise Current Exercise Habits: The patient does not participate in regular exercise at present, Exercise limited by: None  identified;Other - see comments (knee problems)  Diet Patient reports consuming 3 meals a day and 1 snack(s) a day Patient reports that her primary diet is: Regular Patient reports that she does have regular access to food.   Depression Screen PHQ 2/9 Scores 02/18/2020 03/08/2017  PHQ - 2 Score 6 3  PHQ- 9 Score 13 9     Fall Risk Fall Risk  02/18/2020  Falls in the past year? 0  Number falls in past yr: 0  Injury with Fall? 0  Risk for fall due to : No Fall Risks  Follow up Falls evaluation completed     Objective:  Amber NegusLinda Paige seemed alert and oriented and she participated appropriately during our telephone visit.  Blood Pressure Weight BMI  BP Readings from Last 3 Encounters:  11/07/18 118/81  09/22/17 117/80  08/09/17 134/83   Wt Readings from Last 3 Encounters:  02/18/20 145 lb (65.8 kg)  11/07/18 175 lb (79.4 kg)  09/22/17 170 lb 8 oz (77.3 kg)   BMI Readings from Last 1 Encounters:  02/18/20 24.89 kg/m    *Unable to obtain current vital signs, weight, and BMI due to telephone visit type  Hearing/Vision  . Bonita QuinLinda did  seem to have difficulty with hearing/understanding during the telephone conversation . Reports that she has not had a formal eye exam by an eye care professional within the past year . Reports that she has not had a formal hearing evaluation within the past year *Unable to fully assess hearing and vision during telephone visit type  Cognitive Function: 6CIT Screen 02/18/2020  What Year? 0 points  What month? 0 points  What time? 0 points  Count back from 20 0 points  Months in reverse 0 points  Repeat phrase 0 points  Total Score 0   (Normal:0-7, Significant for Dysfunction: >8)  Normal Cognitive Function Screening: Yes   Immunization & Health Maintenance Record Immunization History  Administered Date(s) Administered  . Hepatitis B 11/08/1994, 12/13/1994, 05/30/1995  . Hepatitis B, ped/adol 11/08/1994, 12/13/1994, 05/30/1995  .  Influenza Split 01/13/2011  . Influenza, Seasonal, Injecte, Preservative Fre 09-Aug-1960, 10/02/2015, 10/22/2016  . Influenza,inj,Quad PF,6+ Mos 10/22/2016, 11/22/2018  . Influenza,inj,quad, With Preservative 02/12/2015, 11/29/2017  . Influenza-Unspecified 01/13/2011, 12/20/2011, 11/30/2013, 11/30/2013, 02/12/2015, 02/12/2015, 10/02/2015  . Moderna Sars-Covid-2 Vaccination 08/18/2019, 09/14/2019  . Tdap 07/05/2011, 07/05/2011    Health Maintenance  Topic Date Due  . INFLUENZA VACCINE  03/24/2020 (Originally  09/23/2019)  . COLONOSCOPY (Pts 45-15yrs Insurance coverage will need to be confirmed)  03/24/2020 (Originally 02/23/2019)  . Hepatitis C Screening  03/24/2020 (Originally December 02, 1960)  . HIV Screening  03/24/2020 (Originally 10/27/1974)  . COVID-19 Vaccine (3 - Booster for Moderna series) 03/16/2020  . MAMMOGRAM  01/30/2021  . TETANUS/TDAP  07/04/2021       Assessment  This is a routine wellness examination for [facility].  Health Maintenance: Due or Overdue There are no preventive care reminders to display for this patient.  Amber Hanson does not need a referral for Community Assistance: Care Management:   no Social Work:    no Prescription Assistance:  no Nutrition/Diabetes Education:  no   Plan:  Personalized Goals Goals Addressed            This Visit's Progress   . Patient Stated       02/18/2020 AWV Goal: Fall Prevention  . Over the next year, patient will decrease their risk for falls by: o Using assistive devices, such as a cane or walker, as needed o Identifying fall risks within their home and correcting them by: - Removing throw rugs - Adding handrails to stairs or ramps - Removing clutter and keeping a clear pathway throughout the home - Increasing light, especially at night - Adding shower handles/bars - Raising toilet seat o Identifying potential personal risk factors for falls: - Medication side effects - Incontinence/urgency - Vestibular  dysfunction - Hearing loss - Musculoskeletal disorders - Neurological disorders - Orthostatic hypotension  02/18/2020 AWV Goal: Exercise for General Health   Patient will verbalize understanding of the benefits of increased physical activity:  Exercising regularly is important. It will improve your overall fitness, flexibility, and endurance.  Regular exercise also will improve your overall health. It can help you control your weight, reduce stress, and improve your bone density.  Over the next year, patient will increase physical activity as tolerated with a goal of at least 150 minutes of moderate physical activity per week.   You can tell that you are exercising at a moderate intensity if your heart starts beating faster and you start breathing faster but can still hold a conversation.  Moderate-intensity exercise ideas include:  Walking 1 mile (1.6 km) in about 15 minutes  Biking  Hiking  Golfing  Dancing  Water aerobics  Patient will verbalize understanding of everyday activities that increase physical activity by providing examples like the following: ? Yard work, such as: ? Pushing a Surveyor, mining ? Raking and bagging leaves ? Washing your car ? Pushing a stroller ? Shoveling snow ? Gardening ? Washing windows or floors  Patient will be able to explain general safety guidelines for exercising:   Before you start a new exercise program, talk with your health care provider.  Do not exercise so much that you hurt yourself, feel dizzy, or get very short of breath.  Wear comfortable clothes and wear shoes with good support.  Drink plenty of water while you exercise to prevent dehydration or heat stroke.  Work out until your breathing and your heartbeat get faster.       Personalized Health Maintenance & Screening Recommendations  Influenza vaccine Bone densitometry screening Colorectal cancer screening Glaucoma screening Advanced directives: has NO  advanced directive - not interested in additional information  Lung Cancer Screening Recommended: no (Low Dose CT Chest recommended if Age 5-80 years, 30 pack-year currently smoking OR have quit w/in past 15 years) Hepatitis C Screening recommended: yes HIV Screening  recommended: yes  Advanced Directives: Written information was not prepared per patient's request.  Referrals & Orders No orders of the defined types were placed in this encounter.   Follow-up Plan . Follow-up with Amber Nielsen, DO as planned . Schedule your next wellness exam in one year. . Patient advised about scheduling her eye exam.  . Patient educated related need for a colonoscopy, influenza vaccine and covid booster.   I have personally reviewed and noted the following in the patient's chart:   . Medical and social history . Use of alcohol, tobacco or illicit drugs  . Current medications and supplements . Functional ability and status . Nutritional status . Physical activity . Advanced directives . List of other physicians . Hospitalizations, surgeries, and ER visits in previous 12 months . Vitals . Screenings to include cognitive, depression, and falls . Referrals and appointments  In addition, I have reviewed and discussed with Amber Hanson certain preventive protocols, quality metrics, and best practice recommendations. A written personalized care plan for preventive services as well as general preventive health recommendations is available and can be mailed to the patient at her request.      Modesto Charon, RN  02/18/2020

## 2020-02-18 NOTE — Patient Instructions (Addendum)
MEDICARE ANNUAL WELLNESS VISIT Health Maintenance Summary and Written Plan of Care  [name] ,  Thank you for allowing me to perform your Medicare Annual Wellness Visit and for your ongoing commitment to your health.   Health Maintenance & Immunization History Health Maintenance  Topic Date Due  . INFLUENZA VACCINE  03/24/2020 (Originally 09/23/2019)  . COLONOSCOPY (Pts 45-53yrs Insurance coverage will need to be confirmed)  03/24/2020 (Originally 02/23/2019)  . Hepatitis C Screening  03/24/2020 (Originally 17-Dec-1960)  . HIV Screening  03/24/2020 (Originally 10/27/1974)  . COVID-19 Vaccine (3 - Booster for Moderna series) 03/16/2020  . MAMMOGRAM  01/30/2021  . TETANUS/TDAP  07/04/2021   Immunization History  Administered Date(s) Administered  . Hepatitis B 11/08/1994, 12/13/1994, 05/30/1995  . Hepatitis B, ped/adol 11/08/1994, 12/13/1994, 05/30/1995  . Influenza Split 01/13/2011  . Influenza, Seasonal, Injecte, Preservative Fre Dec 05, 1960, 10/02/2015, 10/22/2016  . Influenza,inj,Quad PF,6+ Mos 10/22/2016, 11/22/2018  . Influenza,inj,quad, With Preservative 02/12/2015, 11/29/2017  . Influenza-Unspecified 01/13/2011, 12/20/2011, 11/30/2013, 11/30/2013, 02/12/2015, 02/12/2015, 10/02/2015  . Moderna Sars-Covid-2 Vaccination 08/18/2019, 09/14/2019  . Tdap 07/05/2011, 07/05/2011    These are the patient goals that we discussed: Goals Addressed            This Visit's Progress   . Patient Stated       02/18/2020 AWV Goal: Fall Prevention  . Over the next year, patient will decrease their risk for falls by: o Using assistive devices, such as a cane or walker, as needed o Identifying fall risks within their home and correcting them by: - Removing throw rugs - Adding handrails to stairs or ramps - Removing clutter and keeping a clear pathway throughout the home - Increasing light, especially at night - Adding shower handles/bars - Raising toilet seat o Identifying potential  personal risk factors for falls: - Medication side effects - Incontinence/urgency - Vestibular dysfunction - Hearing loss - Musculoskeletal disorders - Neurological disorders - Orthostatic hypotension  02/18/2020 AWV Goal: Exercise for General Health   Patient will verbalize understanding of the benefits of increased physical activity:  Exercising regularly is important. It will improve your overall fitness, flexibility, and endurance.  Regular exercise also will improve your overall health. It can help you control your weight, reduce stress, and improve your bone density.  Over the next year, patient will increase physical activity as tolerated with a goal of at least 150 minutes of moderate physical activity per week.   You can tell that you are exercising at a moderate intensity if your heart starts beating faster and you start breathing faster but can still hold a conversation.  Moderate-intensity exercise ideas include:  Walking 1 mile (1.6 km) in about 15 minutes  Biking  Hiking  Golfing  Dancing  Water aerobics  Patient will verbalize understanding of everyday activities that increase physical activity by providing examples like the following: ? Yard work, such as: ? Pushing a Surveyor, mining ? Raking and bagging leaves ? Washing your car ? Pushing a stroller ? Shoveling snow ? Gardening ? Washing windows or floors  Patient will be able to explain general safety guidelines for exercising:   Before you start a new exercise program, talk with your health care provider.  Do not exercise so much that you hurt yourself, feel dizzy, or get very short of breath.  Wear comfortable clothes and wear shoes with good support.  Drink plenty of water while you exercise to prevent dehydration or heat stroke.  Work out until your breathing and your  heartbeat get faster.         This is a list of Health Maintenance Items that are overdue or due now: There are no  preventive care reminders to display for this patient.   Orders/Referrals Placed Today: No orders of the defined types were placed in this encounter.  (Contact our referral department at [phone] if you have not spoken with someone about your referral appointment within the next 5 days)    Follow-up Plan . Follow-up with Sunnie Nielsen, DO as planned . Schedule your next wellness exam in one year. . Patient advised about scheduling her eye exam.  . Patient educated related need for a colonoscopy, influenza vaccine and covid booster.

## 2020-02-27 DIAGNOSIS — H52223 Regular astigmatism, bilateral: Secondary | ICD-10-CM | POA: Diagnosis not present

## 2020-02-27 DIAGNOSIS — H524 Presbyopia: Secondary | ICD-10-CM | POA: Diagnosis not present

## 2020-03-04 DIAGNOSIS — U071 COVID-19: Secondary | ICD-10-CM | POA: Diagnosis not present

## 2020-03-04 DIAGNOSIS — R000 Tachycardia, unspecified: Secondary | ICD-10-CM | POA: Diagnosis not present

## 2020-03-04 DIAGNOSIS — F322 Major depressive disorder, single episode, severe without psychotic features: Secondary | ICD-10-CM | POA: Diagnosis not present

## 2020-03-04 DIAGNOSIS — F419 Anxiety disorder, unspecified: Secondary | ICD-10-CM | POA: Diagnosis not present

## 2020-03-04 DIAGNOSIS — R45851 Suicidal ideations: Secondary | ICD-10-CM | POA: Diagnosis not present

## 2020-03-04 DIAGNOSIS — Z882 Allergy status to sulfonamides status: Secondary | ICD-10-CM | POA: Diagnosis not present

## 2020-03-04 DIAGNOSIS — T50902A Poisoning by unspecified drugs, medicaments and biological substances, intentional self-harm, initial encounter: Secondary | ICD-10-CM | POA: Diagnosis not present

## 2020-03-04 DIAGNOSIS — T43592A Poisoning by other antipsychotics and neuroleptics, intentional self-harm, initial encounter: Secondary | ICD-10-CM | POA: Diagnosis not present

## 2020-03-04 DIAGNOSIS — M1991 Primary osteoarthritis, unspecified site: Secondary | ICD-10-CM | POA: Diagnosis not present

## 2020-03-04 DIAGNOSIS — Z888 Allergy status to other drugs, medicaments and biological substances status: Secondary | ICD-10-CM | POA: Diagnosis not present

## 2020-03-04 DIAGNOSIS — K219 Gastro-esophageal reflux disease without esophagitis: Secondary | ICD-10-CM | POA: Diagnosis not present

## 2020-03-05 DIAGNOSIS — F10229 Alcohol dependence with intoxication, unspecified: Secondary | ICD-10-CM | POA: Insufficient documentation

## 2020-03-05 DIAGNOSIS — T1491XA Suicide attempt, initial encounter: Secondary | ICD-10-CM | POA: Diagnosis not present

## 2020-03-05 DIAGNOSIS — F322 Major depressive disorder, single episode, severe without psychotic features: Secondary | ICD-10-CM | POA: Diagnosis not present

## 2020-03-06 DIAGNOSIS — R45851 Suicidal ideations: Secondary | ICD-10-CM | POA: Diagnosis not present

## 2020-03-07 DIAGNOSIS — R45851 Suicidal ideations: Secondary | ICD-10-CM | POA: Diagnosis not present

## 2020-03-08 DIAGNOSIS — R45851 Suicidal ideations: Secondary | ICD-10-CM | POA: Diagnosis not present

## 2020-03-09 DIAGNOSIS — R45851 Suicidal ideations: Secondary | ICD-10-CM | POA: Diagnosis not present

## 2020-04-09 DIAGNOSIS — F3160 Bipolar disorder, current episode mixed, unspecified: Secondary | ICD-10-CM | POA: Diagnosis not present

## 2020-04-14 DIAGNOSIS — F3160 Bipolar disorder, current episode mixed, unspecified: Secondary | ICD-10-CM | POA: Diagnosis not present

## 2020-04-14 DIAGNOSIS — F4310 Post-traumatic stress disorder, unspecified: Secondary | ICD-10-CM | POA: Diagnosis not present

## 2020-04-14 DIAGNOSIS — F411 Generalized anxiety disorder: Secondary | ICD-10-CM | POA: Diagnosis not present

## 2020-04-15 DIAGNOSIS — F4310 Post-traumatic stress disorder, unspecified: Secondary | ICD-10-CM | POA: Diagnosis not present

## 2020-04-15 DIAGNOSIS — F411 Generalized anxiety disorder: Secondary | ICD-10-CM | POA: Diagnosis not present

## 2020-04-15 DIAGNOSIS — F3160 Bipolar disorder, current episode mixed, unspecified: Secondary | ICD-10-CM | POA: Diagnosis not present

## 2020-04-17 DIAGNOSIS — F4310 Post-traumatic stress disorder, unspecified: Secondary | ICD-10-CM | POA: Diagnosis not present

## 2020-04-17 DIAGNOSIS — F411 Generalized anxiety disorder: Secondary | ICD-10-CM | POA: Diagnosis not present

## 2020-04-17 DIAGNOSIS — F3160 Bipolar disorder, current episode mixed, unspecified: Secondary | ICD-10-CM | POA: Diagnosis not present

## 2020-04-21 DIAGNOSIS — F3160 Bipolar disorder, current episode mixed, unspecified: Secondary | ICD-10-CM | POA: Diagnosis not present

## 2020-04-21 DIAGNOSIS — F411 Generalized anxiety disorder: Secondary | ICD-10-CM | POA: Diagnosis not present

## 2020-04-21 DIAGNOSIS — F4310 Post-traumatic stress disorder, unspecified: Secondary | ICD-10-CM | POA: Diagnosis not present

## 2020-04-22 DIAGNOSIS — F3160 Bipolar disorder, current episode mixed, unspecified: Secondary | ICD-10-CM | POA: Diagnosis not present

## 2020-04-22 DIAGNOSIS — F411 Generalized anxiety disorder: Secondary | ICD-10-CM | POA: Diagnosis not present

## 2020-04-22 DIAGNOSIS — F4310 Post-traumatic stress disorder, unspecified: Secondary | ICD-10-CM | POA: Diagnosis not present

## 2020-04-24 DIAGNOSIS — F4310 Post-traumatic stress disorder, unspecified: Secondary | ICD-10-CM | POA: Diagnosis not present

## 2020-04-24 DIAGNOSIS — F3160 Bipolar disorder, current episode mixed, unspecified: Secondary | ICD-10-CM | POA: Diagnosis not present

## 2020-04-24 DIAGNOSIS — F411 Generalized anxiety disorder: Secondary | ICD-10-CM | POA: Diagnosis not present

## 2020-04-29 DIAGNOSIS — F3160 Bipolar disorder, current episode mixed, unspecified: Secondary | ICD-10-CM | POA: Diagnosis not present

## 2020-04-29 DIAGNOSIS — F4310 Post-traumatic stress disorder, unspecified: Secondary | ICD-10-CM | POA: Diagnosis not present

## 2020-04-29 DIAGNOSIS — F411 Generalized anxiety disorder: Secondary | ICD-10-CM | POA: Diagnosis not present

## 2020-05-01 DIAGNOSIS — F3160 Bipolar disorder, current episode mixed, unspecified: Secondary | ICD-10-CM | POA: Diagnosis not present

## 2020-05-01 DIAGNOSIS — F411 Generalized anxiety disorder: Secondary | ICD-10-CM | POA: Diagnosis not present

## 2020-05-01 DIAGNOSIS — F4310 Post-traumatic stress disorder, unspecified: Secondary | ICD-10-CM | POA: Diagnosis not present

## 2020-05-05 DIAGNOSIS — F3160 Bipolar disorder, current episode mixed, unspecified: Secondary | ICD-10-CM | POA: Diagnosis not present

## 2020-05-05 DIAGNOSIS — F4310 Post-traumatic stress disorder, unspecified: Secondary | ICD-10-CM | POA: Diagnosis not present

## 2020-05-05 DIAGNOSIS — F411 Generalized anxiety disorder: Secondary | ICD-10-CM | POA: Diagnosis not present

## 2020-05-06 DIAGNOSIS — F3160 Bipolar disorder, current episode mixed, unspecified: Secondary | ICD-10-CM | POA: Diagnosis not present

## 2020-05-06 DIAGNOSIS — F411 Generalized anxiety disorder: Secondary | ICD-10-CM | POA: Diagnosis not present

## 2020-05-06 DIAGNOSIS — F4310 Post-traumatic stress disorder, unspecified: Secondary | ICD-10-CM | POA: Diagnosis not present

## 2020-05-16 ENCOUNTER — Telehealth: Payer: Self-pay

## 2020-05-16 NOTE — Telephone Encounter (Signed)
Pt called and made an hospital follow up appt. And needs her bi-polar Rx before her appt. Her appt. Is 05/22/2020 @ 2:50. Pt is completely out of meds. Please advise. - tvt

## 2020-05-16 NOTE — Telephone Encounter (Signed)
Needs to contact her psychiatrist

## 2020-05-16 NOTE — Telephone Encounter (Signed)
She saw psych 04/22/20... This is within 30 days, that office needs to manage this medication. Once pt has visit with me can decide if I can continue Rx    Kiger, Amber Beckers, NP  [address]  [address] [place] [address]  [phone] (Work)  [phone] (Fax)

## 2020-05-16 NOTE — Telephone Encounter (Signed)
Spoke with the pt and she stated that her psychiatrist has left [facility], she was told to see her pcp to get another refill. However she is willing to see if you could refer her to another Psychiatrist, but she wants it to be here in [place]. Please advise. - tvt

## 2020-05-16 NOTE — Telephone Encounter (Signed)
Called pt and gave her the information to contact Ruel Favors NP. She still has an appt for 05/22/2020 to see Dr. Lyn Hollingshead, to see if she can decide to continue the RX. - tvt

## 2020-05-16 NOTE — Telephone Encounter (Signed)
Unable to refill Quetiapine 50 mg rx. Written by historical provider.

## 2020-05-16 NOTE — Telephone Encounter (Signed)
FYI

## 2020-05-16 NOTE — Telephone Encounter (Signed)
Med refill denied by provider. Pls review previous note/recommendations from provider.

## 2020-05-22 ENCOUNTER — Other Ambulatory Visit: Payer: Self-pay

## 2020-05-22 ENCOUNTER — Inpatient Hospital Stay: Payer: Medicare HMO | Admitting: Osteopathic Medicine

## 2020-05-23 ENCOUNTER — Ambulatory Visit (INDEPENDENT_AMBULATORY_CARE_PROVIDER_SITE_OTHER): Payer: Medicare HMO | Admitting: Family Medicine

## 2020-05-23 DIAGNOSIS — Z5329 Procedure and treatment not carried out because of patient's decision for other reasons: Secondary | ICD-10-CM

## 2020-05-23 NOTE — Progress Notes (Signed)
   Complete physical exam  Patient: Amber Hanson   DOB: 12/12/1999   60 y.o. Female  MRN: [id number]  Subjective:    No chief complaint on file.   Amber Hanson is a 60 y.o. female who presents today for a complete physical exam. She reports consuming a {diet types:17450} diet. {types:19826} She generally feels {DESC; WELL/FAIRLY WELL/POORLY:18703}. She reports sleeping {DESC; WELL/FAIRLY WELL/POORLY:18703}. She {does/does not:200015} have additional problems to discuss today.    Most recent fall risk assessment:    08/19/2021   10:42 AM  Fall Risk   Falls in the past year? 0  Number falls in past yr: 0  Injury with Fall? 0  Risk for fall due to : No Fall Risks  Follow up Falls evaluation completed     Most recent depression screenings:    08/19/2021   10:42 AM 07/10/2020   10:46 AM  PHQ 2/9 Scores  PHQ - 2 Score 0 0  PHQ- 9 Score 5     {VISON DENTAL STD PSA (Optional):27386}  {History (Optional):23778}  Patient Care Team: Jessup, Joy, NP as PCP - General (Nurse Practitioner)   Outpatient Medications Prior to Visit  Medication Sig   fluticasone (FLONASE) 50 MCG/ACT nasal spray Place 2 sprays into both nostrils in the morning and at bedtime. After 7 days, reduce to once daily.   norgestimate-ethinyl estradiol (SPRINTEC 28) 0.25-35 MG-MCG tablet Take 1 tablet by mouth daily.   Nystatin POWD Apply liberally to affected area 2 times per day   spironolactone (ALDACTONE) 100 MG tablet Take 1 tablet (100 mg total) by mouth daily.   No facility-administered medications prior to visit.    ROS        Objective:     There were no vitals taken for this visit. {Vitals History (Optional):23777}  Physical Exam   No results found for any visits on 09/24/21. {Show previous labs (optional):23779}    Assessment & Plan:    Routine Health Maintenance and Physical Exam  Immunization History  Administered Date(s) Administered   DTaP 02/25/1999, 04/23/1999,  07/02/1999, 03/17/2000, 10/01/2003   Hepatitis A 07/28/2007, 08/02/2008   Hepatitis B 12/13/1998, 01/20/1999, 07/02/1999   HiB (PRP-OMP) 02/25/1999, 04/23/1999, 07/02/1999, 03/17/2000   IPV 02/25/1999, 04/23/1999, 12/21/1999, 10/01/2003   Influenza,inj,Quad PF,6+ Mos 11/02/2013   Influenza-Unspecified 02/02/2012   MMR 12/20/2000, 10/01/2003   Meningococcal Polysaccharide 08/02/2011   Pneumococcal Conjugate-13 03/17/2000   Pneumococcal-Unspecified 07/02/1999, 09/15/1999   Tdap 08/02/2011   Varicella 12/21/1999, 07/28/2007    Health Maintenance  Topic Date Due   HIV Screening  Never done   Hepatitis C Screening  Never done   INFLUENZA VACCINE  09/22/2021   PAP-Cervical Cytology Screening  09/24/2021 (Originally 12/12/2019)   PAP SMEAR-Modifier  09/24/2021 (Originally 12/12/2019)   TETANUS/TDAP  09/24/2021 (Originally 08/01/2021)   HPV VACCINES  Discontinued   COVID-19 Vaccine  Discontinued    Discussed health benefits of physical activity, and encouraged her to engage in regular exercise appropriate for her age and condition.  Problem List Items Addressed This Visit   None Visit Diagnoses     Annual physical exam    -  Primary   Cervical cancer screening       Need for Tdap vaccination          No follow-ups on file.     Joy Jessup, NP   

## 2020-05-28 DIAGNOSIS — M1711 Unilateral primary osteoarthritis, right knee: Secondary | ICD-10-CM | POA: Diagnosis not present

## 2020-06-04 DIAGNOSIS — F331 Major depressive disorder, recurrent, moderate: Secondary | ICD-10-CM | POA: Diagnosis not present

## 2020-06-04 DIAGNOSIS — Z602 Problems related to living alone: Secondary | ICD-10-CM | POA: Diagnosis not present

## 2020-06-04 DIAGNOSIS — F411 Generalized anxiety disorder: Secondary | ICD-10-CM | POA: Diagnosis not present

## 2020-06-05 ENCOUNTER — Ambulatory Visit (INDEPENDENT_AMBULATORY_CARE_PROVIDER_SITE_OTHER): Payer: Medicare HMO | Admitting: Family Medicine

## 2020-06-05 ENCOUNTER — Encounter: Payer: Self-pay | Admitting: Family Medicine

## 2020-06-05 ENCOUNTER — Ambulatory Visit (INDEPENDENT_AMBULATORY_CARE_PROVIDER_SITE_OTHER): Payer: Medicare HMO

## 2020-06-05 ENCOUNTER — Other Ambulatory Visit: Payer: Self-pay

## 2020-06-05 VITALS — BP 139/89 | HR 73 | Temp 98.5°F

## 2020-06-05 DIAGNOSIS — M85671 Other cyst of bone, right ankle and foot: Secondary | ICD-10-CM | POA: Diagnosis not present

## 2020-06-05 DIAGNOSIS — R220 Localized swelling, mass and lump, head: Secondary | ICD-10-CM

## 2020-06-05 DIAGNOSIS — M79674 Pain in right toe(s): Secondary | ICD-10-CM

## 2020-06-05 DIAGNOSIS — Z1231 Encounter for screening mammogram for malignant neoplasm of breast: Secondary | ICD-10-CM | POA: Diagnosis not present

## 2020-06-05 DIAGNOSIS — R2241 Localized swelling, mass and lump, right lower limb: Secondary | ICD-10-CM | POA: Diagnosis not present

## 2020-06-05 NOTE — Progress Notes (Signed)
Acute Office Visit  Subjective:    Patient ID: Amber Hanson, female    DOB: [birth year], 60 y.o.   MRN: [id number]  Chief Complaint  Patient presents with  . Toe Pain    HPI Patient is in today for nodules to toes and scalp, mammogram order.   Patient with a nodule to right great toe dorsomedially and 3rd toe dorsally. She reports they have been there for at least several weeks (unable to give more specific estimate). States they are painful, but are not changing. Reports she has been picking at the one on her 3rd toe because it gets irritated rubbing against her shoe - currently has a little scab on top, but does not appear infected. Great toe nodule/cyst seems to be a little bit deeper. States she has not had anything like this before. Other various lower extremity  Joint pains related to arthritis per patient. She just saw her orthopedic MD last week for a knee injection, but forgot to tell him about her toes. She says she would prefer to just get an x-ray and go see podiatry instead of following back up with ortho first.   Patient also reports a large nodule on the top of her head. States it has been there for a long time and is not painful, but it gets really annoying. She would like to have it removed. She states it is firm, round, not painful, no drainage or bleeding, has not changed.  She denies any fevers, loss of function or range of motion, skin changes.  Also reporting that she needs a mammogram this year and would like an order. Denies any breast pain or changes.   Past Medical History:  Diagnosis Date  . Anxiety 03/08/2017  . Arthritis 03/08/2017  . Bipolar 1 disorder (HCC) 08/20/2015   Overview:  Dr. Chilton Si, [facility]. Now Dr Jordan Likes   Last Assessment & Plan:  Relevant Hx: Course: Daily Update: Today's Plan:  . Chronic low back pain 07/05/2011   Overview:  Dr. Karie Schwalbe. [facility]  . Gastroesophageal reflux disease without esophagitis 08/20/2015  . Insomnia 03/05/2013  .  Migraine without status migrainosus, not intractable 09/19/2012  . Postmenopausal HRT (hormone replacement therapy) 08/20/2015    Past Surgical History:  Procedure Laterality Date  . CESAREAN SECTION     (2)   . CHOLECYSTECTOMY    . KNEE SURGERY Bilateral   . VAGINAL HYSTERECTOMY      Family History  Problem Relation Age of Onset  . Cancer Mother   . Depression Father   . Diabetes Father   . Depression Sister   . Depression Brother   . Stroke Other   . GER disease Other     Social History   Socioeconomic History  . Marital status: Widowed    Spouse name: Not on file  . Number of children: 2  . Years of education: Not on file  . Highest education level: Associate degree: academic program  Occupational History  . Occupation: [employer]    Comment: Working part time  Tobacco Use  . Smoking status: Never Smoker  . Smokeless tobacco: Never Used  Vaping Use  . Vaping Use: Never used  Substance and Sexual Activity  . Alcohol use: No  . Drug use: No  . Sexual activity: Not Currently    Partners: Male    Birth control/protection: None  Other Topics Concern  . Not on file  Social History Narrative  . Not on file   Social  Determinants of Health   Financial Resource Strain: Low Risk   . Difficulty of Paying Living Expenses: Not hard at all  Food Insecurity: No Food Insecurity  . Worried About Programme researcher, broadcasting/film/videounning Out of Food in the Last Year: Never true  . Ran Out of Food in the Last Year: Never true  Transportation Needs: No Transportation Needs  . Lack of Transportation (Medical): No  . Lack of Transportation (Non-Medical): No  Physical Activity: Inactive  . Days of Exercise per Week: 0 days  . Minutes of Exercise per Session: 0 min  Stress: No Stress Concern Present  . Feeling of Stress : Only a little  Social Connections: Socially Isolated  . Frequency of Communication with Friends and Family: More than three times a week  . Frequency of Social Gatherings with Friends and  Family: Once a week  . Attends Religious Services: Never  . Active Member of Clubs or Organizations: No  . Attends BankerClub or Organization Meetings: Never  . Marital Status: Widowed  Intimate Partner Violence: Not At Risk  . Fear of Current or Ex-Partner: No  . Emotionally Abused: No  . Physically Abused: No  . Sexually Abused: No    Outpatient Medications Prior to Visit  Medication Sig Dispense Refill  . Acetaminophen-Caffeine 500-65 MG TABS Take by mouth. (Patient not taking: Reported on 02/18/2020)    . acetaminophen-codeine (TYLENOL #4) 300-60 MG tablet Take 1 tablet by mouth every 4 (four) hours as needed for moderate pain. (Patient not taking: Reported on 02/18/2020) 30 tablet 0  . busPIRone (BUSPAR) 15 MG tablet     . celecoxib (CELEBREX) 200 MG capsule Take 1 capsule (200 mg total) by mouth 2 (two) times daily. 180 capsule 3  . cetirizine (ZYRTEC) 10 MG tablet Take by mouth.    . diclofenac sodium (VOLTAREN) 1 % GEL Apply 2-4 g topically 4 (four) times daily. To affected joint. 100 g 11  . DULoxetine (CYMBALTA) 60 MG capsule     . estradiol (ESTRACE) 1 MG tablet Take 1 tablet by mouth once daily 90 tablet 3  . gabapentin (NEURONTIN) 300 MG capsule Take 1 capsule (300 mg total) by mouth 3 (three) times daily as needed (nerve pain). (Patient not taking: Reported on 02/18/2020) 90 capsule 0  . pantoprazole (PROTONIX) 20 MG tablet Take 1 tablet (20 mg total) by mouth daily. 90 tablet 3  . propranolol (INDERAL) 20 MG tablet TAKE 1 TABLET TWICE DAILY (NEED MD APPOINTMENT) 30 tablet 0  . QUEtiapine (SEROQUEL) 25 MG tablet     . QUEtiapine (SEROQUEL) 50 MG tablet Take by mouth. (Patient not taking: Reported on 02/18/2020)    . triamcinolone acetonide (KENALOG-40) 40 MG/ML injection (RADIOLOGY ONLY) Inject into the articular space. (Patient not taking: Reported on 02/18/2020)    . valACYclovir (VALTREX) 1000 MG tablet Take 1 tablet (1,000 mg total) by mouth 3 (three) times daily. (Patient not  taking: Reported on 02/18/2020) 21 tablet 0   No facility-administered medications prior to visit.    Allergies  Allergen Reactions  . Other Itching, Nausea And Vomiting and Other (See Comments)    Patient states she has taken this safely before since the one episode of itching  Anti-inflammatory - affects liver function Patient states she has taken this safely before since the one episode of itching  Anti-inflammatory - affects liver function  Patient states she has taken this safely before since the one episode of itching  Anti-inflammatory - affects liver function  . Red Dye  Itching    Patient states she has taken this safely before since the one episode of itching    . Sulfa Antibiotics Anaphylaxis  . Sulfasalazine Anaphylaxis and Swelling  . Tramadol Nausea And Vomiting and Other (See Comments)    Headaches    . Ibuprofen Other (See Comments)    Affects liver functions    Review of Systems All review of systems negative except what is listed in the HPI     Objective:    Physical Exam Constitutional:      Appearance: Normal appearance.  HENT:     Head: Atraumatic.   Cardiovascular:     Rate and Rhythm: Regular rhythm.     Pulses: Normal pulses.     Heart sounds: Normal heart sounds.  Pulmonary:     Effort: Pulmonary effort is normal.     Breath sounds: Normal breath sounds.  Abdominal:     Palpations: Abdomen is soft.  Musculoskeletal:        General: No swelling. Normal range of motion.     Cervical back: Normal range of motion.       Feet:  Skin:    General: Skin is warm and dry.  Neurological:     Mental Status: She is alert and oriented to person, place, and time. Mental status is at baseline.  Psychiatric:        Mood and Affect: Mood normal.        Behavior: Behavior normal.        Thought Content: Thought content normal.        Judgment: Judgment normal.     BP 139/89   Pulse 73   Temp 98.5 F (36.9 C)   LMP  (LMP Unknown)   SpO2 95%   Wt Readings from Last 3 Encounters:  02/18/20 145 lb (65.8 kg)  11/07/18 175 lb (79.4 kg)  09/22/17 170 lb 8 oz (77.3 kg)    Health Maintenance Due  Topic Date Due  . Hepatitis C Screening  Never done  . HIV Screening  Never done  . COLONOSCOPY (Pts 45-66yrs Insurance coverage will need to be confirmed)  02/23/2019  . COVID-19 Vaccine (3 - Booster for Moderna series) 03/16/2020    There are no preventive care reminders to display for this patient.   No results found for: TSH No results found for: WBC, HGB, HCT, MCV, PLT Lab Results  Component Value Date   NA 140 07/14/2017   K 3.9 07/14/2017   CO2 31 07/14/2017   GLUCOSE 71 07/14/2017   BUN 16 07/14/2017   CREATININE 0.78 07/14/2017   BILITOT 0.4 07/14/2017   AST 17 07/14/2017   ALT 19 07/14/2017   PROT 6.0 (L) 07/14/2017   CALCIUM 9.2 07/14/2017   No results found for: CHOL No results found for: HDL No results found for: LDLCALC No results found for: TRIG No results found for: CHOLHDL No results found for: UTML4Y     Assessment & Plan:   1. Encounter for screening mammogram for malignant neoplasm of breast Mammogram is due by the end of this year. Patient would like me to go ahead and place the order. No acute changes or concerns.   - MM 3D SCREEN BREAST BILATERAL; Future  2. Toe pain, right 3. Skin nodule of toe of right foot Patient with nodules to right great toe and 3rd toe, possibly cysts. Patient requesting x-ray and podiatry referral. Will order. The one on the 3rd toe has been rubbing against  her shoe and she has been picking at it leaving a scab. Does not appear to be infected today, but encouraged her not to pick at it and to use some antibiotic ointment like Neosporin if she breaks the skin. Educated on signs of infection or other symptoms that would require further evaluation. Will see what podiatry has to say.   - DG Foot Complete Right; Future - Ambulatory referral to Podiatry  4. Nodule of skin  of head Patient with chronic cyst/nodule to right scalp that she reports doesn't bother her, but is getting annoying. She is wanting to know if someone can remove it. Will have her schedule an appointment with Dr. Karie Schwalbe in our office to get his opinion and see if he is able to remove it.    Lollie Marrow Reola Calkins, DNP, FNP-C

## 2020-06-05 NOTE — Patient Instructions (Signed)
Xray and podiatry referral ordered. Mammogram ordered. Schedule an appointment with Dr. Karie Schwalbe to look at nodule on your scalp.

## 2020-06-09 NOTE — Progress Notes (Signed)
Your foot x-ray was normal. You can follow-up with podiatry as we ordered. If you see Dr. Karie Schwalbe for the scalp nodule before you get in with podiatry, he could probably give you some recommendations for your toes as well.

## 2020-06-10 ENCOUNTER — Ambulatory Visit: Payer: Medicare HMO | Admitting: Sports Medicine

## 2020-06-11 ENCOUNTER — Ambulatory Visit (INDEPENDENT_AMBULATORY_CARE_PROVIDER_SITE_OTHER): Payer: Medicare HMO | Admitting: Sports Medicine

## 2020-06-11 ENCOUNTER — Other Ambulatory Visit: Payer: Self-pay

## 2020-06-11 DIAGNOSIS — G8929 Other chronic pain: Secondary | ICD-10-CM | POA: Diagnosis not present

## 2020-06-11 DIAGNOSIS — M25561 Pain in right knee: Secondary | ICD-10-CM

## 2020-06-11 DIAGNOSIS — L7211 Pilar cyst: Secondary | ICD-10-CM | POA: Insufficient documentation

## 2020-06-11 MED ORDER — HYDROCODONE-ACETAMINOPHEN 5-325 MG PO TABS: 1.0000 | ORAL_TABLET | Freq: Three times a day (TID) | ORAL | 0 refills | Status: DC | PRN

## 2020-06-11 NOTE — Progress Notes (Signed)
    Procedures performed today:    Procedure:  Excision of scalp pilar cyst/subcutaneous tumor, 2.5 cm Risks, benefits, and alternatives explained and consent obtained. Time out conducted. Surface prepped with alcohol. 5cc lidocaine with epinephine infiltrated in a field block. Adequate anesthesia ensured. Area prepped and draped in a sterile fashion. Excision performed with: Using a #10 blade I made a linear incision, then using blunt dissection I was able to remove the cyst en bloc, I then closed the incision with a single 3-0 horizontal mattress suture. Hemostasis achieved. Pt stable.  Independent interpretation of notes and tests performed by another provider:   None.  Brief History, Exam, Impression, and Recommendations:    Pilar cyst Noted pilar cyst, surgical excision today. Return in 1 week for suture removal.  Right knee pain Right knee pain, likely osteoarthritis, we will address this at the follow-up visit. She has had steroid injections but no viscosupplementation.    ___________________________________________ Ihor Austin. Benjamin Stain, M.D., ABFM., CAQSM. Primary Care and Sports Medicine [facility]  Adjunct Instructor of Family Medicine  [employer]

## 2020-06-11 NOTE — Assessment & Plan Note (Signed)
Right knee pain, likely osteoarthritis, we will address this at the follow-up visit. She has had steroid injections but no viscosupplementation.

## 2020-06-11 NOTE — Patient Instructions (Signed)
Incision Care, Adult An incision is a surgical cut that is made through your skin. Most incisions are closed after a surgical procedure. Your incision may be closed with stitches (sutures), staples, skin glue, or adhesive strips. You may need to return to your health care provider to have sutures or staples removed. This may occur several days or several weeks after your surgery. Until then, the incision needs to be cared for properly to prevent infection. Follow instructions from your health care provider about how to care for your incision. Supplies needed:  Soap, water, and a clean hand towel.  Wound cleanser.  A clean bandage (dressing), if needed.  Cream or ointment, if told by your health care provider.  Clean gauze. How to care for your incision Cleaning the incision Ask your health care provider how to clean the incision. This may include:  Using mild soap and water, or wound cleanser.  Using a clean gauze to pat the incision dry after cleaning it. Dressing changes  Wash your hands with soap and water for at least 20 seconds before and after you change the dressing. If soap and water are not available, use hand sanitizer.  Change your dressing as told by your health care provider.  Leave sutures, staples, skin glue, or adhesive strips in place. These skin closures may need to stay in place for 2 weeks or longer. If adhesive strip edges start to loosen and curl up, you may trim the loose edges. Do not remove adhesive strips completely unless your health care provider tells you to do that.  Apply cream or ointment. Do this only as told by your health care provider.  Cover the incision with a clean dressing. Ask your health care provider when you can begin leaving the incision uncovered. Checking for infection Check your incision area every day for signs of infection. Check for:  More redness, swelling, or pain.  More fluid or blood.  Warmth.  Pus or a bad smell.    Follow these instructions at home Medicines  Take over-the-counter and prescription medicines only as told by your health care provider.  If you were prescribed an antibiotic medicine, cream, or ointment, take or apply it as told by your health care provider. Do not stop using the antibiotic even if your condition improves. Eating and drinking  Eat a diet that includes protein, vitamin A, vitamin C, and other nutrient-rich foods to help the wound heal. ? Foods rich in protein include meat, fish, eggs, dairy, beans, and nuts. ? Foods rich in vitamin A include carrots and dark green, leafy vegetables. ? Foods rich in vitamin C include citrus fruits, tomatoes, broccoli, and peppers.  Drink enough fluid to keep your urine pale yellow. General instructions  Do not take baths, swim, use a hot tub, or do anything that would put the incision underwater until your health care provider approves. Ask your health care provider if you may take showers. You may only be allowed to take sponge baths.  Limit movement around your incision to promote healing. ? Avoid straining, lifting, or exercising for the first 2 weeks after your procedure, or for as long as told by your health care provider. ? Return to your normal activities as told by your health care provider. Ask your health care provider what activities are safe for you.  Do not scratch or pick at the incision. Keep it covered as told by your health care provider.  Protect your incision from the sun when you are   outside for the first 6 months, or for as long as told by your health care provider. Cover up the scar area or apply sunscreen that has an SPF of at least 30.  Do not use any products that contain nicotine or tobacco, such as cigarettes, e-cigarettes, and chewing tobacco. These can delay incision healing after surgery. If you need help quitting, ask your health care provider.  Keep all follow-up visits as told by your health care  provider. This is important.   Contact a health care provider if:  You have any of these signs of infection: ? More redness, swelling, or pain around your incision. ? More fluid or blood coming from your incision. ? Warmth coming from your incision. ? Pus or a bad smell coming from your incision. ? A fever.  You are nauseous or you vomit.  You are dizzy.  Your sutures, staples, skin glue, or adhesive strips come undone. Get help right away if:  You have a red streak on the skin near your incision.  Your incision bleeds through the dressing and the bleeding does not stop with gentle pressure.  The edges of your incision open up and separate.  You have signs of a serious bodily reaction to an infection. These signs may include: ? Fever, shaking chills, or feeling very cold. ? Confusion or anxiety. ? Severe pain. ? Trouble breathing. ? Fast heartbeat. ? Clammy or sweaty skin. ? A rash. These symptoms may represent a serious problem that is an emergency. Do not wait to see if the symptoms will go away. Get medical help right away. Call your local emergency services (911 in the [place] Do not drive yourself to the hospital. Summary  Follow instructions from your health care provider about how to care for your incision.  Wash your hands with soap and water for at least 20 seconds before and after you change the dressing. If soap and water are not available, use hand sanitizer.  Check your incision area every day for signs of infection.  Keep all follow-up visits as told by your health care provider. This is important. This information is not intended to replace advice given to you by your health care provider. Make sure you discuss any questions you have with your health care provider. Document Revised: 11/29/2018 Document Reviewed: 11/29/2018 Elsevier Patient Education  2021 Elsevier Inc.   

## 2020-06-11 NOTE — Assessment & Plan Note (Signed)
Noted pilar cyst, surgical excision today. Return in 1 week for suture removal.

## 2020-06-18 ENCOUNTER — Ambulatory Visit (INDEPENDENT_AMBULATORY_CARE_PROVIDER_SITE_OTHER): Payer: Medicare HMO | Admitting: Sports Medicine

## 2020-06-18 ENCOUNTER — Other Ambulatory Visit: Payer: Self-pay

## 2020-06-18 DIAGNOSIS — L7211 Pilar cyst: Secondary | ICD-10-CM

## 2020-06-18 DIAGNOSIS — M1711 Unilateral primary osteoarthritis, right knee: Secondary | ICD-10-CM

## 2020-06-18 MED ORDER — HYDROCODONE-ACETAMINOPHEN 5-325 MG PO TABS: 1.0000 | ORAL_TABLET | Freq: Three times a day (TID) | ORAL | 0 refills | Status: DC | PRN

## 2020-06-18 NOTE — Assessment & Plan Note (Signed)
1 week post surgical excision of a pilar cyst, sutures removed today, starting to heal well.

## 2020-06-18 NOTE — Progress Notes (Signed)
    Procedures performed today:    None.  Independent interpretation of notes and tests performed by another provider:   None.  Brief History, Exam, Impression, and Recommendations:    Pilar cyst 1 week post surgical excision of a pilar cyst, sutures removed today, starting to heal well.  Primary osteoarthritis of right knee Amber Hanson does need a knee injection, she can return next week for the injection, because her scalp wound is still healing I do want to hold off today for another steroid injection, refilling hydrocodone in the meantime to hold her over.    ___________________________________________ Ihor Austin. Benjamin Stain, M.D., ABFM., CAQSM. Primary Care and Sports Medicine [facility]  Adjunct Instructor of Family Medicine  [employer]

## 2020-06-18 NOTE — Assessment & Plan Note (Signed)
Amber Hanson does need a knee injection, she can return next week for the injection, because her scalp wound is still healing I do want to hold off today for another steroid injection, refilling hydrocodone in the meantime to hold her over.

## 2020-06-19 ENCOUNTER — Ambulatory Visit (INDEPENDENT_AMBULATORY_CARE_PROVIDER_SITE_OTHER): Payer: Medicare HMO

## 2020-06-19 ENCOUNTER — Telehealth: Payer: Self-pay

## 2020-06-19 DIAGNOSIS — Z1231 Encounter for screening mammogram for malignant neoplasm of breast: Secondary | ICD-10-CM | POA: Diagnosis not present

## 2020-06-19 DIAGNOSIS — M1711 Unilateral primary osteoarthritis, right knee: Secondary | ICD-10-CM

## 2020-06-19 DIAGNOSIS — L7211 Pilar cyst: Secondary | ICD-10-CM

## 2020-06-19 NOTE — Telephone Encounter (Signed)
[name] called and states Amber Hanson is out of the Hydrocodone. She would like it sent to [facility] [address]. She has already called and they do have it in stock. Pended prescription.

## 2020-06-20 MED ORDER — HYDROCODONE-ACETAMINOPHEN 5-325 MG PO TABS: 1.0000 | ORAL_TABLET | Freq: Three times a day (TID) | ORAL | 0 refills | Status: DC | PRN

## 2020-06-20 NOTE — Telephone Encounter (Signed)
Done

## 2020-07-04 DIAGNOSIS — F411 Generalized anxiety disorder: Secondary | ICD-10-CM | POA: Diagnosis not present

## 2020-07-04 DIAGNOSIS — F331 Major depressive disorder, recurrent, moderate: Secondary | ICD-10-CM | POA: Diagnosis not present

## 2020-07-04 DIAGNOSIS — Z602 Problems related to living alone: Secondary | ICD-10-CM | POA: Diagnosis not present

## 2020-07-14 ENCOUNTER — Other Ambulatory Visit: Payer: Self-pay

## 2020-07-14 DIAGNOSIS — M1711 Unilateral primary osteoarthritis, right knee: Secondary | ICD-10-CM

## 2020-07-14 DIAGNOSIS — L7211 Pilar cyst: Secondary | ICD-10-CM

## 2020-07-14 MED ORDER — HYDROCODONE-ACETAMINOPHEN 5-325 MG PO TABS: 1.0000 | ORAL_TABLET | Freq: Three times a day (TID) | ORAL | 0 refills | Status: DC | PRN

## 2020-07-14 NOTE — Telephone Encounter (Signed)
Patient called to report that she needs for pain meds as she waits to hear about the gel shots for her knees. VM forwarded to [facility] for her to follow up with patient. Refill request sent to you.

## 2020-07-14 NOTE — Telephone Encounter (Signed)
Attempted to call patient; VM full, unable to leave msg.

## 2020-07-15 NOTE — Telephone Encounter (Signed)
Left msg on VM that prescription has been sent to pharmacy as requested.

## 2020-07-16 ENCOUNTER — Telehealth: Payer: Self-pay | Admitting: Sports Medicine

## 2020-07-16 NOTE — Telephone Encounter (Signed)
That is because we were just going to do a steroid injection, not Orthovisc.  In fact I had held off on doing the injection because of her scalp wound and I did not want to impair healing.  She can make an appointment anytime.  On 06/18/2020 I had actually told her to return in a week for the injection.

## 2020-07-16 NOTE — Telephone Encounter (Signed)
Dr. Karie Schwalbe   Patient Called stating she has been waiting 6 weeks to hear about Orthovisc injections and doesn't understand why it takes this long her knee is hurting she has bone rubbing bone and it is swollen. I do not have an order from you to do Orthovisc for this patient is this something you wanted to have for her? Please Advise  Arline Asp

## 2020-07-28 ENCOUNTER — Ambulatory Visit (INDEPENDENT_AMBULATORY_CARE_PROVIDER_SITE_OTHER): Payer: Medicare HMO

## 2020-07-28 ENCOUNTER — Ambulatory Visit (INDEPENDENT_AMBULATORY_CARE_PROVIDER_SITE_OTHER): Payer: Medicare HMO | Admitting: Sports Medicine

## 2020-07-28 ENCOUNTER — Other Ambulatory Visit: Payer: Self-pay

## 2020-07-28 DIAGNOSIS — M25461 Effusion, right knee: Secondary | ICD-10-CM | POA: Diagnosis not present

## 2020-07-28 DIAGNOSIS — M1711 Unilateral primary osteoarthritis, right knee: Secondary | ICD-10-CM

## 2020-07-28 DIAGNOSIS — Z87828 Personal history of other (healed) physical injury and trauma: Secondary | ICD-10-CM | POA: Diagnosis not present

## 2020-07-28 DIAGNOSIS — M79641 Pain in right hand: Secondary | ICD-10-CM

## 2020-07-28 NOTE — Assessment & Plan Note (Signed)
Amber Hanson also has some pain in her right hand, it is over the dorsum of the hand mid third metacarpal, she really does not have any tenderness at her PIP, MCP, or wrist joint, there is really no swelling, we are can get some x-rays but not really sure what is causing her pain at this juncture. My hope is that systemic effect from her knee injection will help her hand pain as well. We can revisit this at the 1 month follow-up.

## 2020-07-28 NOTE — Progress Notes (Signed)
    Procedures performed today:    Procedure: Real-time Ultrasound Guided injection of the right knee Device: Samsung HS60  Verbal informed consent obtained.  Time-out conducted.  Noted no overlying erythema, induration, or other signs of local infection.  Skin prepped in a sterile fashion.  Local anesthesia: Topical Ethyl chloride.  With sterile technique and under real time ultrasound guidance:  Trace effusion noted, 1 cc Kenalog 40, 2 cc lidocaine, 2 cc bupivacaine injected easily Completed without difficulty  Advised to call if fevers/chills, erythema, induration, drainage, or persistent bleeding.  Images permanently stored and available for review in PACS.  Impression: Technically successful ultrasound guided injection.  Independent interpretation of notes and tests performed by another provider:   None.  Brief History, Exam, Impression, and Recommendations:    Primary osteoarthritis of right knee History of left knee meniscal tear, she has some chronic pain in her right knee, suprapatellar and lateral. Increasing pain. We are going to do an injection at the last visit but we held off as I had just removed a pilar cyst and we were wanting her wound to heal well. Her pilar cyst incision is not even visible anymore, today we injected her right knee, I would like some baseline x-rays. Return to see me in a month.  This is a chronic process with exacerbation and pharmacologic intervention.  Right hand pain Darcus also has some pain in her right hand, it is over the dorsum of the hand mid third metacarpal, she really does not have any tenderness at her PIP, MCP, or wrist joint, there is really no swelling, we are can get some x-rays but not really sure what is causing her pain at this juncture. My hope is that systemic effect from her knee injection will help her hand pain as well. We can revisit this at the 1 month  follow-up.    ___________________________________________ Ihor Austin. Benjamin Stain, M.D., ABFM., CAQSM. Primary Care and Sports Medicine [facility]  Adjunct Instructor of Family Medicine  [employer]

## 2020-07-28 NOTE — Assessment & Plan Note (Addendum)
History of left knee meniscal tear, she has some chronic pain in her right knee, suprapatellar and lateral. Increasing pain. We are going to do an injection at the last visit but we held off as I had just removed a pilar cyst and we were wanting her wound to heal well. Her pilar cyst incision is not even visible anymore, today we injected her right knee, I would like some baseline x-rays. Return to see me in a month.  This is a chronic process with exacerbation and pharmacologic intervention.

## 2020-08-04 DIAGNOSIS — F331 Major depressive disorder, recurrent, moderate: Secondary | ICD-10-CM | POA: Diagnosis not present

## 2020-08-04 DIAGNOSIS — F411 Generalized anxiety disorder: Secondary | ICD-10-CM | POA: Diagnosis not present

## 2020-08-04 DIAGNOSIS — Z602 Problems related to living alone: Secondary | ICD-10-CM | POA: Diagnosis not present

## 2020-09-17 DIAGNOSIS — F331 Major depressive disorder, recurrent, moderate: Secondary | ICD-10-CM | POA: Diagnosis not present

## 2020-09-17 DIAGNOSIS — Z658 Other specified problems related to psychosocial circumstances: Secondary | ICD-10-CM | POA: Diagnosis not present

## 2020-09-17 DIAGNOSIS — T50904A Poisoning by unspecified drugs, medicaments and biological substances, undetermined, initial encounter: Secondary | ICD-10-CM | POA: Diagnosis not present

## 2020-09-17 DIAGNOSIS — R7401 Elevation of levels of liver transaminase levels: Secondary | ICD-10-CM | POA: Diagnosis not present

## 2020-09-17 DIAGNOSIS — R0689 Other abnormalities of breathing: Secondary | ICD-10-CM | POA: Diagnosis not present

## 2020-09-17 DIAGNOSIS — G4700 Insomnia, unspecified: Secondary | ICD-10-CM | POA: Diagnosis not present

## 2020-09-17 DIAGNOSIS — X58XXXA Exposure to other specified factors, initial encounter: Secondary | ICD-10-CM | POA: Diagnosis not present

## 2020-09-17 DIAGNOSIS — Z20822 Contact with and (suspected) exposure to covid-19: Secondary | ICD-10-CM | POA: Diagnosis not present

## 2020-09-17 DIAGNOSIS — Z602 Problems related to living alone: Secondary | ICD-10-CM | POA: Diagnosis not present

## 2020-09-17 DIAGNOSIS — T402X2A Poisoning by other opioids, intentional self-harm, initial encounter: Secondary | ICD-10-CM | POA: Diagnosis not present

## 2020-09-17 DIAGNOSIS — K219 Gastro-esophageal reflux disease without esophagitis: Secondary | ICD-10-CM | POA: Diagnosis not present

## 2020-09-17 DIAGNOSIS — F411 Generalized anxiety disorder: Secondary | ICD-10-CM | POA: Diagnosis not present

## 2020-09-17 DIAGNOSIS — F314 Bipolar disorder, current episode depressed, severe, without psychotic features: Secondary | ICD-10-CM | POA: Diagnosis not present

## 2020-09-17 DIAGNOSIS — F32A Depression, unspecified: Secondary | ICD-10-CM | POA: Diagnosis not present

## 2020-09-17 DIAGNOSIS — Y999 Unspecified external cause status: Secondary | ICD-10-CM | POA: Diagnosis not present

## 2020-09-17 DIAGNOSIS — T1491XA Suicide attempt, initial encounter: Secondary | ICD-10-CM | POA: Diagnosis not present

## 2020-09-17 DIAGNOSIS — T887XXA Unspecified adverse effect of drug or medicament, initial encounter: Secondary | ICD-10-CM | POA: Diagnosis not present

## 2020-09-18 DIAGNOSIS — T402X2A Poisoning by other opioids, intentional self-harm, initial encounter: Secondary | ICD-10-CM | POA: Diagnosis not present

## 2020-09-18 DIAGNOSIS — F32A Depression, unspecified: Secondary | ICD-10-CM | POA: Diagnosis not present

## 2020-09-18 DIAGNOSIS — T1491XA Suicide attempt, initial encounter: Secondary | ICD-10-CM | POA: Diagnosis not present

## 2020-09-18 DIAGNOSIS — X58XXXA Exposure to other specified factors, initial encounter: Secondary | ICD-10-CM | POA: Diagnosis not present

## 2020-09-18 DIAGNOSIS — R7989 Other specified abnormal findings of blood chemistry: Secondary | ICD-10-CM | POA: Insufficient documentation

## 2020-09-18 DIAGNOSIS — Z20822 Contact with and (suspected) exposure to covid-19: Secondary | ICD-10-CM | POA: Diagnosis not present

## 2020-09-18 DIAGNOSIS — K219 Gastro-esophageal reflux disease without esophagitis: Secondary | ICD-10-CM | POA: Diagnosis not present

## 2020-09-18 DIAGNOSIS — Y999 Unspecified external cause status: Secondary | ICD-10-CM | POA: Diagnosis not present

## 2020-09-18 DIAGNOSIS — G4700 Insomnia, unspecified: Secondary | ICD-10-CM | POA: Diagnosis not present

## 2020-09-18 DIAGNOSIS — T50911A Poisoning by multiple unspecified drugs, medicaments and biological substances, accidental (unintentional), initial encounter: Secondary | ICD-10-CM | POA: Diagnosis not present

## 2020-09-19 DIAGNOSIS — Z658 Other specified problems related to psychosocial circumstances: Secondary | ICD-10-CM | POA: Diagnosis not present

## 2020-09-19 DIAGNOSIS — K219 Gastro-esophageal reflux disease without esophagitis: Secondary | ICD-10-CM | POA: Diagnosis not present

## 2020-09-19 DIAGNOSIS — F319 Bipolar disorder, unspecified: Secondary | ICD-10-CM | POA: Diagnosis not present

## 2020-09-19 DIAGNOSIS — F3181 Bipolar II disorder: Secondary | ICD-10-CM | POA: Diagnosis not present

## 2020-09-19 DIAGNOSIS — F4310 Post-traumatic stress disorder, unspecified: Secondary | ICD-10-CM | POA: Diagnosis not present

## 2020-09-19 DIAGNOSIS — Z634 Disappearance and death of family member: Secondary | ICD-10-CM | POA: Diagnosis not present

## 2020-09-19 DIAGNOSIS — Z639 Problem related to primary support group, unspecified: Secondary | ICD-10-CM | POA: Diagnosis not present

## 2020-09-19 DIAGNOSIS — T402X2A Poisoning by other opioids, intentional self-harm, initial encounter: Secondary | ICD-10-CM | POA: Diagnosis not present

## 2020-09-19 DIAGNOSIS — Z20822 Contact with and (suspected) exposure to covid-19: Secondary | ICD-10-CM | POA: Diagnosis not present

## 2020-09-19 DIAGNOSIS — F411 Generalized anxiety disorder: Secondary | ICD-10-CM | POA: Diagnosis not present

## 2020-09-19 DIAGNOSIS — Z602 Problems related to living alone: Secondary | ICD-10-CM | POA: Diagnosis not present

## 2020-09-19 DIAGNOSIS — R7401 Elevation of levels of liver transaminase levels: Secondary | ICD-10-CM | POA: Diagnosis not present

## 2020-09-19 DIAGNOSIS — F314 Bipolar disorder, current episode depressed, severe, without psychotic features: Secondary | ICD-10-CM | POA: Diagnosis not present

## 2020-09-19 DIAGNOSIS — T1491XA Suicide attempt, initial encounter: Secondary | ICD-10-CM | POA: Diagnosis not present

## 2020-09-19 DIAGNOSIS — Z9151 Personal history of suicidal behavior: Secondary | ICD-10-CM | POA: Diagnosis not present

## 2020-09-19 DIAGNOSIS — G4700 Insomnia, unspecified: Secondary | ICD-10-CM | POA: Diagnosis not present

## 2020-09-19 DIAGNOSIS — R7989 Other specified abnormal findings of blood chemistry: Secondary | ICD-10-CM | POA: Diagnosis not present

## 2020-09-19 DIAGNOSIS — Z609 Problem related to social environment, unspecified: Secondary | ICD-10-CM | POA: Diagnosis not present

## 2020-09-22 DIAGNOSIS — F3181 Bipolar II disorder: Secondary | ICD-10-CM | POA: Insufficient documentation

## 2020-09-26 ENCOUNTER — Other Ambulatory Visit: Payer: Self-pay | Admitting: *Deleted

## 2020-09-26 DIAGNOSIS — Z780 Asymptomatic menopausal state: Secondary | ICD-10-CM

## 2020-09-26 DIAGNOSIS — K219 Gastro-esophageal reflux disease without esophagitis: Secondary | ICD-10-CM

## 2020-09-26 DIAGNOSIS — M1990 Unspecified osteoarthritis, unspecified site: Secondary | ICD-10-CM

## 2020-09-26 DIAGNOSIS — Z79890 Hormone replacement therapy (postmenopausal): Secondary | ICD-10-CM

## 2020-09-26 DIAGNOSIS — G8929 Other chronic pain: Secondary | ICD-10-CM

## 2020-09-26 DIAGNOSIS — M545 Low back pain, unspecified: Secondary | ICD-10-CM

## 2020-09-26 MED ORDER — CELECOXIB 200 MG PO CAPS
200.0000 mg | ORAL_CAPSULE | Freq: Two times a day (BID) | ORAL | 3 refills | Status: DC
Start: 2020-09-26 — End: 2021-07-15

## 2020-09-26 MED ORDER — PANTOPRAZOLE SODIUM 20 MG PO TBEC: 20.0000 mg | DELAYED_RELEASE_TABLET | Freq: Every day | ORAL | 3 refills | Status: DC

## 2020-09-26 MED ORDER — ESTRADIOL 1 MG PO TABS: 1.0000 mg | ORAL_TABLET | Freq: Every day | ORAL | 3 refills | Status: DC

## 2020-10-17 DIAGNOSIS — Z602 Problems related to living alone: Secondary | ICD-10-CM | POA: Diagnosis not present

## 2020-10-17 DIAGNOSIS — F331 Major depressive disorder, recurrent, moderate: Secondary | ICD-10-CM | POA: Diagnosis not present

## 2020-10-17 DIAGNOSIS — F411 Generalized anxiety disorder: Secondary | ICD-10-CM | POA: Diagnosis not present

## 2020-10-23 ENCOUNTER — Ambulatory Visit (INDEPENDENT_AMBULATORY_CARE_PROVIDER_SITE_OTHER): Payer: Medicare HMO | Admitting: Osteopathic Medicine

## 2020-10-23 ENCOUNTER — Encounter: Payer: Self-pay | Admitting: Osteopathic Medicine

## 2020-10-23 ENCOUNTER — Other Ambulatory Visit: Payer: Self-pay

## 2020-10-23 VITALS — BP 113/55 | HR 62 | Temp 98.3°F | Wt 149.0 lb

## 2020-10-23 DIAGNOSIS — F419 Anxiety disorder, unspecified: Secondary | ICD-10-CM

## 2020-10-23 DIAGNOSIS — F32A Depression, unspecified: Secondary | ICD-10-CM | POA: Diagnosis not present

## 2020-10-23 DIAGNOSIS — Z1211 Encounter for screening for malignant neoplasm of colon: Secondary | ICD-10-CM | POA: Diagnosis not present

## 2020-10-23 DIAGNOSIS — Z23 Encounter for immunization: Secondary | ICD-10-CM

## 2020-10-23 DIAGNOSIS — F319 Bipolar disorder, unspecified: Secondary | ICD-10-CM | POA: Diagnosis not present

## 2020-10-23 DIAGNOSIS — B359 Dermatophytosis, unspecified: Secondary | ICD-10-CM | POA: Diagnosis not present

## 2020-10-23 DIAGNOSIS — R229 Localized swelling, mass and lump, unspecified: Secondary | ICD-10-CM | POA: Diagnosis not present

## 2020-10-23 MED ORDER — KETOCONAZOLE 2 % EX CREA: 1.0000 "application " | TOPICAL_CREAM | Freq: Two times a day (BID) | CUTANEOUS | 1 refills | Status: AC

## 2020-10-23 NOTE — Progress Notes (Signed)
Amber Hanson is a 60 y.o. female who presents to  [facility]  today, 10/23/20, seeking care for the following:  CONCERN FOR RASH - L upper chest / shoulder area, round red lesion w/ some central celaring c/w ringworm, no bites in that area, lesion does not appear c/w erythema migrans  CONCERN FOR SKIN LESIONS - 2 small flesh colored nodules, barely above skin surface, on nose, and larger lesion similar flesh color on arm, no vascular formation on the lesions.      ASSESSMENT & PLAN with other pertinent findings:  The primary encounter diagnosis was Ringworm. Diagnoses of Skin nodule - 2 on face, 1 on L arm, ddx includes BCC, Colon cancer screening, Need for influenza vaccination, Need for shingles vaccine, Anxiety and depression, and Bipolar 1 disorder (HCC) were also pertinent to this visit.   1. Ringworm Antifungal sent  2. Skin nodule - 2 on face, 1 on L arm, ddx includes BCC Offered biopsy arm lesion, low suspicion but there is some concern for basal cell, pt opts for dermatology referral, advised if this appt is >6 weeks would RTC for biopsy here for arm, face lesions do not appear cancerous but would consider cryo  3. Colon cancer screening GI referral placed  4. Need for influenza vaccination Done today  5. Need for shingles vaccine Done today  6. Anxiety and depression 7. Bipolar 1 disorder [facility]) Pt's psychiatrist no longer taking her insurance, rewquests referral elsewhere    There are no Patient Instructions on file for this visit.  Orders Placed This Encounter  Procedures   Varicella-zoster vaccine IM (Shingrix)   Flu Vaccine QUAD 6+ mos PF IM (Fluarix Quad PF)   Ambulatory referral to Gastroenterology   Ambulatory referral to Psychiatry   Ambulatory referral to Dermatology    Meds ordered this encounter  Medications   ketoconazole (NIZORAL) 2 % cream    Sig: Apply 1 application topically 2 (two)  times daily. To affected areas.    Dispense:  30 g    Refill:  1     See below for relevant physical exam findings  See below for recent lab and imaging results reviewed  Medications, allergies, PMH, PSH, SocH, FamH reviewed below    Follow-up instructions: Return in about 6 months (around 04/22/2021) for ANNUAL PHYSICAL / ESTABLISH W/ DR MATTHEWS .                                        Exam:  BP (!) 113/55 (BP Location: Left Arm, Patient Position: Sitting, Cuff Size: Large)   Pulse 62   Temp 98.3 F (36.8 C) (Oral)   Wt 149 lb 0.6 oz (67.6 kg)   LMP  (LMP Unknown)   BMI 25.58 kg/m  Constitutional: VS see above. General Appearance: alert, well-developed, well-nourished, NAD Neck: No masses, trachea midline.  Respiratory: Normal respiratory effort. no wheeze, no rhonchi, no rales Cardiovascular: S1/S2 normal, no murmur, no rub/gallop auscultated. RRR.  Musculoskeletal: Gait normal. Symmetric and independent movement of all extremities Neurological: Normal balance/coordination. No tremor. Skin: warm, dry, intact. See above  Psychiatric: Normal judgment/insight. Normal mood and affect. Oriented x3.   Current Meds  Medication Sig   busPIRone (BUSPAR) 15 MG tablet    celecoxib (CELEBREX) 200 MG capsule Take 1 capsule (200 mg total) by mouth 2 (two) times daily.  cetirizine (ZYRTEC) 10 MG tablet Take by mouth.   diclofenac sodium (VOLTAREN) 1 % GEL Apply 2-4 g topically 4 (four) times daily. To affected joint.   DULoxetine (CYMBALTA) 60 MG capsule    estradiol (ESTRACE) 1 MG tablet Take 1 tablet (1 mg total) by mouth daily.   HYDROcodone-acetaminophen (NORCO/VICODIN) 5-325 MG tablet Take 1 tablet by mouth every 8 (eight) hours as needed for moderate pain.   ketoconazole (NIZORAL) 2 % cream Apply 1 application topically 2 (two) times daily. To affected areas.   pantoprazole (PROTONIX) 20 MG tablet Take 1 tablet (20 mg total) by mouth daily.    propranolol (INDERAL) 20 MG tablet TAKE 1 TABLET TWICE DAILY (NEED MD APPOINTMENT)   QUEtiapine (SEROQUEL) 25 MG tablet     Allergies  Allergen Reactions   Other Itching, Nausea And Vomiting and Other (See Comments)    Patient states she has taken this safely before since the one episode of itching  Anti-inflammatory - affects liver function Patient states she has taken this safely before since the one episode of itching  Anti-inflammatory - affects liver function  Patient states she has taken this safely before since the one episode of itching  Anti-inflammatory - affects liver function   Red Dye Itching    Patient states she has taken this safely before since the one episode of itching     Sulfa Antibiotics Anaphylaxis   Sulfasalazine Anaphylaxis and Swelling   Tramadol Nausea And Vomiting and Other (See Comments)    Headaches     Ibuprofen Other (See Comments)    Affects liver functions    Patient Active Problem List   Diagnosis Date Noted   Right hand pain 07/28/2020   Pilar cyst 06/11/2020   Tardive dyskinesia 05/10/2019   Bipolar 1 disorder, depressed, full remission (HCC) 05/10/2019   PTSD (post-traumatic stress disorder) 07/05/2018   Greater trochanteric bursitis of left hip 06/09/2017   H/O hysterectomy for benign disease 04/25/2017   Primary osteoarthritis of right shoulder 04/04/2017   Right cervical radiculopathy 04/04/2017   Anxiety 03/08/2017   Arthritis 03/08/2017   Plantar fasciitis, left 08/16/2016   S/P left knee arthroscopy 11/11/2015   Acute medial meniscal tear 10/29/2015   Allergic rhinitis due to pollen 08/20/2015   Bipolar 1 disorder (HCC) 08/20/2015   Gastroesophageal reflux disease 08/20/2015   Multiple joint pain 08/20/2015   Postmenopausal HRT (hormone replacement therapy) 08/20/2015   Primary osteoarthritis of right knee 07/12/2015   Primary osteoarthritis of first carpometacarpal joint of left hand 09/23/2014   Insomnia 03/05/2013    Migraine without status migrainosus, not intractable 09/19/2012   Menopausal symptoms 06/12/2012   Chronic low back pain 07/05/2011    Family History  Problem Relation Age of Onset   Cancer Mother    Depression Father    Diabetes Father    Depression Sister    Depression Brother    Stroke Other    GER disease Other     Social History   Tobacco Use  Smoking Status Never  Smokeless Tobacco Never    Past Surgical History:  Procedure Laterality Date   CESAREAN SECTION     (2)    CHOLECYSTECTOMY     KNEE SURGERY Bilateral    VAGINAL HYSTERECTOMY      Immunization History  Administered Date(s) Administered   Hepatitis B 11/08/1994, 12/13/1994, 05/30/1995   Hepatitis B, ped/adol 11/08/1994, 12/13/1994, 05/30/1995   Influenza Split 01/13/2011   Influenza, Seasonal, Injecte, Preservative Fre [birth year], 1959, 10/02/2015, 10/22/2016  Influenza,inj,Quad PF,6+ Mos 10/22/2016, 11/22/2018, 10/23/2020   Influenza,inj,quad, With Preservative 02/12/2015, 11/29/2017   Influenza-Unspecified 01/13/2011, 12/20/2011, 11/30/2013, 11/30/2013, 02/12/2015, 02/12/2015, 10/02/2015   Moderna Sars-Covid-2 Vaccination 08/18/2019, 09/14/2019   Pneumococcal Polysaccharide-23 09/19/2020   Tdap 07/05/2011, 07/05/2011   Zoster Recombinat (Shingrix) 10/23/2020    No results found for this or any previous visit (from the past 2160 hour(s)).  No results found.     All questions at time of visit were answered - patient instructed to contact office with any additional concerns or updates. ER/RTC precautions were reviewed with the patient as applicable.   Please note: manual typing as well as voice recognition software may have been used to produce this document - typos may escape review. Please contact Dr. Lyn Hollingshead for any needed clarifications.

## 2020-10-29 ENCOUNTER — Encounter: Payer: Self-pay | Admitting: Gastroenterology

## 2020-11-19 DIAGNOSIS — Z602 Problems related to living alone: Secondary | ICD-10-CM | POA: Diagnosis not present

## 2020-11-19 DIAGNOSIS — F331 Major depressive disorder, recurrent, moderate: Secondary | ICD-10-CM | POA: Diagnosis not present

## 2020-11-19 DIAGNOSIS — F411 Generalized anxiety disorder: Secondary | ICD-10-CM | POA: Diagnosis not present

## 2020-12-02 ENCOUNTER — Telehealth: Payer: Self-pay

## 2020-12-02 NOTE — Telephone Encounter (Signed)
Amber Hanson called and left a message stating she is wanting to be seen sooner. See note below.    10/30/2020  4:06 PM Doss, Aaron Mose Spoke to patient, offered next available new patient appointment in March. She does not want to wait that long to be seen and asked that referral be routed back to referring office so they can find someone to see her sooner.

## 2020-12-04 NOTE — Telephone Encounter (Signed)
Sent referral to Dermatology and skin surgery in [place] they will call and schedule with patient and should be able to see her sooner than March - CF

## 2020-12-15 ENCOUNTER — Ambulatory Visit (AMBULATORY_SURGERY_CENTER): Payer: Medicare HMO | Admitting: *Deleted

## 2020-12-15 ENCOUNTER — Other Ambulatory Visit: Payer: Self-pay

## 2020-12-15 VITALS — Ht 64.0 in | Wt 149.0 lb

## 2020-12-15 DIAGNOSIS — Z1211 Encounter for screening for malignant neoplasm of colon: Secondary | ICD-10-CM

## 2020-12-15 MED ORDER — NA SULFATE-K SULFATE-MG SULF 17.5-3.13-1.6 GM/177ML PO SOLN: 1.0000 | ORAL | 0 refills | Status: DC

## 2020-12-15 NOTE — Progress Notes (Signed)
Patient's pre-visit was done today over the phone with the patient due to COVID-19 pandemic. Name,DOB and address verified. Patient denies any allergies to Eggs and Soy. Patient denies any problems with anesthesia/sedation. Patient is not taking any diet pills or blood thinners. No home Oxygen. Packet of Prep instructions mailed to patient including a copy of a consent form-pt is aware. Patient understands to call us back with any questions or concerns. Patient is aware of our care-partner policy and Covid-19 safety protocol.   The patient is COVID-19 vaccinated.   

## 2020-12-23 ENCOUNTER — Other Ambulatory Visit: Payer: Self-pay | Admitting: Osteopathic Medicine

## 2020-12-23 DIAGNOSIS — Z79890 Hormone replacement therapy (postmenopausal): Secondary | ICD-10-CM

## 2020-12-23 DIAGNOSIS — L918 Other hypertrophic disorders of the skin: Secondary | ICD-10-CM | POA: Diagnosis not present

## 2020-12-23 DIAGNOSIS — N390 Urinary tract infection, site not specified: Secondary | ICD-10-CM

## 2020-12-23 DIAGNOSIS — L570 Actinic keratosis: Secondary | ICD-10-CM | POA: Diagnosis not present

## 2020-12-23 DIAGNOSIS — L853 Xerosis cutis: Secondary | ICD-10-CM | POA: Diagnosis not present

## 2020-12-25 ENCOUNTER — Encounter: Payer: Self-pay | Admitting: Gastroenterology

## 2020-12-25 ENCOUNTER — Other Ambulatory Visit: Payer: Self-pay

## 2020-12-25 ENCOUNTER — Ambulatory Visit (AMBULATORY_SURGERY_CENTER): Payer: Medicare HMO | Admitting: Gastroenterology

## 2020-12-25 VITALS — BP 126/74 | HR 63 | Temp 97.1°F | Resp 13 | Ht 64.0 in | Wt 149.0 lb

## 2020-12-25 DIAGNOSIS — F419 Anxiety disorder, unspecified: Secondary | ICD-10-CM | POA: Diagnosis not present

## 2020-12-25 DIAGNOSIS — Z1211 Encounter for screening for malignant neoplasm of colon: Secondary | ICD-10-CM | POA: Diagnosis not present

## 2020-12-25 DIAGNOSIS — F319 Bipolar disorder, unspecified: Secondary | ICD-10-CM | POA: Diagnosis not present

## 2020-12-25 MED ORDER — SODIUM CHLORIDE 0.9 % IV SOLN: 500.0000 mL | Freq: Once | INTRAVENOUS | Status: DC

## 2020-12-25 NOTE — Op Note (Signed)
[facility] Patient Name: Amber Hanson Procedure Date: 12/25/2020 7:41 AM MRN: [id number] Endoscopist: Sherilyn Cooter L. Myrtie Neither , MD Age: 60 Referring MD:  Date of Birth: 02/13/1961 Gender: Female Account #: [id number] Procedure:                Colonoscopy Indications:              Screening for colorectal malignant neoplasm                           Patient reported a normal colonoscopy in 2007 at                            outside hospital Medicines:                Monitored Anesthesia Care Procedure:                Pre-Anesthesia Assessment:                           - Prior to the procedure, a History and Physical                            was performed, and patient medications and                            allergies were reviewed. The patient's tolerance of                            previous anesthesia was also reviewed. The risks                            and benefits of the procedure and the sedation                            options and risks were discussed with the patient.                            All questions were answered, and informed consent                            was obtained. Prior Anticoagulants: The patient has                            taken no previous anticoagulant or antiplatelet                            agents. ASA Grade Assessment: II - A patient with                            mild systemic disease. After reviewing the risks                            and benefits, the patient was deemed in  satisfactory condition to undergo the procedure.                           After obtaining informed consent, the colonoscope                            was passed under direct vision. Throughout the                            procedure, the patient's blood pressure, pulse, and                            oxygen saturations were monitored continuously. The                            CF [id number] #[id number] was introduced through the anus                             and advanced to the the cecum, identified by                            appendiceal orifice and ileocecal valve. The                            colonoscopy was somewhat difficult due to a                            redundant colon. Successful completion of the                            procedure was aided by using manual pressure and                            straightening and shortening the scope to obtain                            bowel loop reduction. The patient tolerated the                            procedure well. The quality of the bowel                            preparation was excellent. The ileocecal valve,                            appendiceal orifice, and rectum were photographed. Scope In: 8:00:26 AM Scope Out: 8:15:18 AM Scope Withdrawal Time: 0 hours 9 minutes 30 seconds  Total Procedure Duration: 0 hours 14 minutes 52 seconds  Findings:                 The perianal and digital rectal examinations were                            normal.  The entire examined colon appeared normal on direct                            and retroflexion views. Complications:            No immediate complications. Estimated Blood Loss:     Estimated blood loss: none. Impression:               - The entire examined colon is normal on direct and                            retroflexion views.                           - No specimens collected. Recommendation:           - Patient has a contact number available for                            emergencies. The signs and symptoms of potential                            delayed complications were discussed with the                            patient. Return to normal activities tomorrow.                            Written discharge instructions were provided to the                            patient.                           - Resume previous diet.                           - Continue present  medications.                           - Repeat colonoscopy in 10 years for screening                            purposes. Erubiel Manasco L. Myrtie Neither, MD 12/25/2020 8:18:53 AM This report has been signed electronically.

## 2020-12-25 NOTE — Patient Instructions (Signed)

## 2020-12-25 NOTE — Progress Notes (Signed)
History and Physical:  This patient presents for endoscopic testing for: Encounter Diagnosis  Name Primary?   Special screening for malignant neoplasms, colon Yes    Patient reports last screening colonoscopy in [place], [place] in 2007 Patient denies chronic abdominal pain, rectal bleeding, constipation or diarrhea.   ROS: Patient denies chest pain or cough   Past Medical History: Past Medical History:  Diagnosis Date   Anxiety 03/08/2017   Arthritis 03/08/2017   Bipolar 1 disorder (HCC) 08/20/2015   Overview:  Dr. Chilton Si, [facility]. Now Dr Jordan Likes   Last Assessment & Plan:  Relevant Hx: Course: Daily Update: Today's Plan:   Chronic low back pain 07/05/2011   Overview:  Dr. Karie Schwalbe. [facility]   Gastroesophageal reflux disease without esophagitis 08/20/2015   Insomnia 03/05/2013   Migraine without status migrainosus, not intractable 09/19/2012   Postmenopausal HRT (hormone replacement therapy) 08/20/2015     Past Surgical History: Past Surgical History:  Procedure Laterality Date   CESAREAN SECTION     (2)    CHOLECYSTECTOMY     COLONOSCOPY  2007   in [place]- normal exam   KNEE SURGERY Bilateral    VAGINAL HYSTERECTOMY      Allergies: Allergies  Allergen Reactions   Other Itching, Nausea And Vomiting and Other (See Comments)    Patient states she has taken this safely before since the one episode of itching  Anti-inflammatory - affects liver function Patient states she has taken this safely before since the one episode of itching  Anti-inflammatory - affects liver function  Patient states she has taken this safely before since the one episode of itching  Anti-inflammatory - affects liver function   Red Dye Itching    Patient states she has taken this safely before since the one episode of itching     Sulfa Antibiotics Anaphylaxis   Sulfasalazine Anaphylaxis and Swelling   Tramadol Nausea And Vomiting and Other (See Comments)    Headaches      Ibuprofen Other (See Comments)    Affects liver functions    Outpatient Meds: Current Outpatient Medications  Medication Sig Dispense Refill   busPIRone (BUSPAR) 15 MG tablet      celecoxib (CELEBREX) 200 MG capsule Take 1 capsule (200 mg total) by mouth 2 (two) times daily. 180 capsule 3   cetirizine (ZYRTEC) 10 MG tablet Take by mouth.     DULoxetine (CYMBALTA) 60 MG capsule      estradiol (ESTRACE) 1 MG tablet Take 1 tablet (1 mg total) by mouth daily. 90 tablet 3   pantoprazole (PROTONIX) 20 MG tablet Take 1 tablet (20 mg total) by mouth daily. 90 tablet 3   prazosin (MINIPRESS) 2 MG capsule      propranolol (INDERAL) 20 MG tablet TAKE 1 TABLET TWICE DAILY (NEED MD APPOINTMENT) 30 tablet 0   QUEtiapine (SEROQUEL) 25 MG tablet      diclofenac sodium (VOLTAREN) 1 % GEL Apply 2-4 g topically 4 (four) times daily. To affected joint. (Patient not taking: No sig reported) 100 g 11   ketoconazole (NIZORAL) 2 % cream Apply 1 application topically 2 (two) times daily. To affected areas. (Patient not taking: Reported on 12/25/2020) 30 g 1   valACYclovir (VALTREX) 1000 MG tablet Take 1 tablet (1,000 mg total) by mouth 3 (three) times daily. (Patient not taking: No sig reported) 21 tablet 0   Current Facility-Administered Medications  Medication Dose Route Frequency Provider Last Rate Last Admin   0.9 %  sodium chloride infusion  500 mL Intravenous Once Sherrilyn Rist, MD          ___________________________________________________________________ Objective   Exam:  BP 138/86   Pulse 61   Temp (!) 97.1 F (36.2 C) (Skin)   Resp 18   Ht 5\' 4"  (1.626 m)   Wt 149 lb (67.6 kg)   LMP  (LMP Unknown)   SpO2 100%   BMI 25.58 kg/m   CV: RRR without murmur, S1/S2 Resp: clear to auscultation bilaterally, normal RR and effort noted GI: soft, no tenderness, with active bowel sounds.   Assessment: Encounter Diagnosis  Name Primary?   Special screening for malignant neoplasms, colon  Yes     Plan: Colonoscopy  The benefits and risks of the planned procedure were described in detail with the patient or (when appropriate) their health care proxy.  Risks were outlined as including, but not limited to, bleeding, infection, perforation, adverse medication reaction leading to cardiac or pulmonary decompensation, pancreatitis (if ERCP).  The limitation of incomplete mucosal visualization was also discussed.  No guarantees or warranties were given.    The patient is appropriate for an endoscopic procedure in the ambulatory setting.   - , MD

## 2020-12-25 NOTE — Progress Notes (Signed)
Report given to PACU, vss 

## 2020-12-25 NOTE — Progress Notes (Signed)
Pt's states no medical or surgical changes since previsit or office visit. VS assessed by C.W 

## 2021-03-04 ENCOUNTER — Other Ambulatory Visit: Payer: Self-pay

## 2021-03-04 ENCOUNTER — Ambulatory Visit (INDEPENDENT_AMBULATORY_CARE_PROVIDER_SITE_OTHER): Payer: Medicare PPO | Admitting: Family Medicine

## 2021-03-04 ENCOUNTER — Encounter: Payer: Self-pay | Admitting: Family Medicine

## 2021-03-04 DIAGNOSIS — F319 Bipolar disorder, unspecified: Secondary | ICD-10-CM

## 2021-03-04 DIAGNOSIS — F419 Anxiety disorder, unspecified: Secondary | ICD-10-CM

## 2021-03-04 DIAGNOSIS — F3176 Bipolar disorder, in full remission, most recent episode depressed: Secondary | ICD-10-CM

## 2021-03-04 MED ORDER — DICLOFENAC SODIUM 1 % EX GEL: 2.0000 g | Freq: Four times a day (QID) | CUTANEOUS | 3 refills | Status: AC

## 2021-03-04 MED ORDER — BUSPIRONE HCL 15 MG PO TABS: 15.0000 mg | ORAL_TABLET | Freq: Two times a day (BID) | ORAL | 3 refills | Status: DC

## 2021-03-04 MED ORDER — PROPRANOLOL HCL 20 MG PO TABS: ORAL_TABLET | ORAL | 1 refills | Status: AC
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
## 2021-04-10 DIAGNOSIS — F319 Bipolar disorder, unspecified: Secondary | ICD-10-CM | POA: Diagnosis not present

## 2021-04-10 DIAGNOSIS — R45851 Suicidal ideations: Secondary | ICD-10-CM | POA: Diagnosis not present

## 2021-04-10 DIAGNOSIS — Z882 Allergy status to sulfonamides status: Secondary | ICD-10-CM | POA: Diagnosis not present

## 2021-04-10 DIAGNOSIS — T1491XA Suicide attempt, initial encounter: Secondary | ICD-10-CM | POA: Diagnosis not present

## 2021-04-10 DIAGNOSIS — Z888 Allergy status to other drugs, medicaments and biological substances status: Secondary | ICD-10-CM | POA: Diagnosis not present

## 2021-04-10 DIAGNOSIS — U071 COVID-19: Secondary | ICD-10-CM | POA: Diagnosis not present

## 2021-04-10 DIAGNOSIS — Z881 Allergy status to other antibiotic agents status: Secondary | ICD-10-CM | POA: Diagnosis not present

## 2021-04-10 DIAGNOSIS — Z885 Allergy status to narcotic agent status: Secondary | ICD-10-CM | POA: Diagnosis not present

## 2021-04-10 DIAGNOSIS — M1990 Unspecified osteoarthritis, unspecified site: Secondary | ICD-10-CM | POA: Diagnosis not present

## 2021-04-10 DIAGNOSIS — K219 Gastro-esophageal reflux disease without esophagitis: Secondary | ICD-10-CM | POA: Diagnosis not present

## 2021-04-10 DIAGNOSIS — F29 Unspecified psychosis not due to a substance or known physiological condition: Secondary | ICD-10-CM | POA: Diagnosis not present

## 2021-04-10 DIAGNOSIS — F419 Anxiety disorder, unspecified: Secondary | ICD-10-CM | POA: Diagnosis not present

## 2021-04-11 DIAGNOSIS — M1990 Unspecified osteoarthritis, unspecified site: Secondary | ICD-10-CM | POA: Diagnosis not present

## 2021-04-11 DIAGNOSIS — Z888 Allergy status to other drugs, medicaments and biological substances status: Secondary | ICD-10-CM | POA: Diagnosis not present

## 2021-04-11 DIAGNOSIS — F331 Major depressive disorder, recurrent, moderate: Secondary | ICD-10-CM | POA: Diagnosis not present

## 2021-04-11 DIAGNOSIS — F4310 Post-traumatic stress disorder, unspecified: Secondary | ICD-10-CM | POA: Diagnosis not present

## 2021-04-11 DIAGNOSIS — M545 Low back pain, unspecified: Secondary | ICD-10-CM | POA: Diagnosis not present

## 2021-04-11 DIAGNOSIS — Z885 Allergy status to narcotic agent status: Secondary | ICD-10-CM | POA: Diagnosis not present

## 2021-04-11 DIAGNOSIS — F411 Generalized anxiety disorder: Secondary | ICD-10-CM | POA: Diagnosis not present

## 2021-04-11 DIAGNOSIS — K219 Gastro-esophageal reflux disease without esophagitis: Secondary | ICD-10-CM | POA: Diagnosis not present

## 2021-04-11 DIAGNOSIS — Z881 Allergy status to other antibiotic agents status: Secondary | ICD-10-CM | POA: Diagnosis not present

## 2021-04-11 DIAGNOSIS — M19042 Primary osteoarthritis, left hand: Secondary | ICD-10-CM | POA: Diagnosis not present

## 2021-04-11 DIAGNOSIS — Z882 Allergy status to sulfonamides status: Secondary | ICD-10-CM | POA: Diagnosis not present

## 2021-04-11 DIAGNOSIS — F419 Anxiety disorder, unspecified: Secondary | ICD-10-CM | POA: Diagnosis not present

## 2021-04-11 DIAGNOSIS — F319 Bipolar disorder, unspecified: Secondary | ICD-10-CM | POA: Diagnosis not present

## 2021-04-11 DIAGNOSIS — G43909 Migraine, unspecified, not intractable, without status migrainosus: Secondary | ICD-10-CM | POA: Diagnosis not present

## 2021-04-11 DIAGNOSIS — G8929 Other chronic pain: Secondary | ICD-10-CM | POA: Diagnosis not present

## 2021-04-11 DIAGNOSIS — R45851 Suicidal ideations: Secondary | ICD-10-CM | POA: Diagnosis not present

## 2021-04-17 ENCOUNTER — Telehealth: Payer: Self-pay | Admitting: General Practice

## 2021-04-17 NOTE — Telephone Encounter (Signed)
Transition Care Management Unsuccessful Follow-up Telephone Call  Date of discharge and from where:  04/15/21 from [facility]  Attempts:  1st Attempt  Reason for unsuccessful TCM follow-up call:  No answer/busy

## 2021-04-20 NOTE — Telephone Encounter (Signed)
Transition Care Management Unsuccessful Follow-up Telephone Call  Date of discharge and from where:  04/15/21 from [facility]  Attempts:  2nd Attempt  Reason for unsuccessful TCM follow-up call:  No answer/busy

## 2021-04-22 ENCOUNTER — Encounter: Payer: Medicare HMO | Admitting: Family Medicine

## 2021-04-22 NOTE — Telephone Encounter (Signed)
Transition Care Management Unsuccessful Follow-up Telephone Call ? ?Date of discharge and from where:  04/15/21 from [facility] ? ?Attempts:  3rd Attempt ? ?Reason for unsuccessful TCM follow-up call:  No answer/busy ? ?  ?

## 2021-05-01 ENCOUNTER — Ambulatory Visit (INDEPENDENT_AMBULATORY_CARE_PROVIDER_SITE_OTHER): Payer: Self-pay | Admitting: Psychiatry

## 2021-05-01 ENCOUNTER — Encounter (HOSPITAL_COMMUNITY): Payer: Self-pay | Admitting: Psychiatry

## 2021-05-01 DIAGNOSIS — F419 Anxiety disorder, unspecified: Secondary | ICD-10-CM

## 2021-05-01 DIAGNOSIS — F319 Bipolar disorder, unspecified: Secondary | ICD-10-CM

## 2021-05-01 DIAGNOSIS — F411 Generalized anxiety disorder: Secondary | ICD-10-CM

## 2021-05-01 MED ORDER — DULOXETINE HCL 60 MG PO CPEP: 60.0000 mg | ORAL_CAPSULE | Freq: Every day | ORAL | 0 refills | Status: DC

## 2021-05-01 MED ORDER — QUETIAPINE FUMARATE 150 MG PO TABS: 150.0000 mg | ORAL_TABLET | Freq: Every day | ORAL | 0 refills | Status: DC
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
## 2021-05-27 ENCOUNTER — Telehealth (HOSPITAL_COMMUNITY): Payer: Self-pay | Admitting: Psychiatry

## 2021-05-27 NOTE — Telephone Encounter (Signed)
Pt wants a note to be excused from jury duty  ? ?*Will come pick it up - call her  ?

## 2021-05-28 ENCOUNTER — Encounter (HOSPITAL_COMMUNITY): Payer: Self-pay | Admitting: Psychiatry

## 2021-06-04 ENCOUNTER — Ambulatory Visit: Payer: Medicare PPO | Admitting: Family Medicine

## 2021-06-16 ENCOUNTER — Encounter (HOSPITAL_COMMUNITY): Payer: Self-pay | Admitting: Psychiatry

## 2021-06-16 ENCOUNTER — Ambulatory Visit (INDEPENDENT_AMBULATORY_CARE_PROVIDER_SITE_OTHER): Payer: Medicare PPO | Admitting: Psychiatry

## 2021-06-16 VITALS — BP 130/78 | Temp 98.6°F | Ht 64.0 in | Wt 165.0 lb

## 2021-06-16 DIAGNOSIS — F419 Anxiety disorder, unspecified: Secondary | ICD-10-CM | POA: Diagnosis not present

## 2021-06-16 DIAGNOSIS — F319 Bipolar disorder, unspecified: Secondary | ICD-10-CM

## 2021-06-16 DIAGNOSIS — F411 Generalized anxiety disorder: Secondary | ICD-10-CM | POA: Diagnosis not present

## 2021-06-16 MED ORDER — QUETIAPINE FUMARATE 100 MG PO TABS: 100.0000 mg | ORAL_TABLET | Freq: Every day | ORAL | 0 refills | Status: DC

## 2021-06-16 MED ORDER — DULOXETINE HCL 60 MG PO CPEP: 60.0000 mg | ORAL_CAPSULE | Freq: Every day | ORAL | 0 refills | Status: DC

## 2021-06-16 NOTE — Progress Notes (Signed)
[facility] follow up visit ? ?Patient Identification: Amber Hanson ?MRN:  [id number] ?Date of Evaluation:  06/16/2021 ?Referral Source: primary care ?Chief Complaint:   ?No chief complaint on file. ? ?Visit Diagnosis:  ?  ICD-10-CM   ?1. Bipolar 1 disorder (HCC)  F31.9 DULoxetine (CYMBALTA) 60 MG capsule  ?  ?2. GAD (generalized anxiety disorder)  F41.1   ?  ?3. Anxiety  F41.9 DULoxetine (CYMBALTA) 60 MG capsule  ?  ? ? ? ? ?  ? ?History of Present Illness: Patient is a 61 years old currently widowed Caucasian femaleinitially  referred by primary care to establish care for bipolar disorder and anxiety.  She lives by herself has 2 grown kids ? ?History of hospital admission with OD at [facility] ? ?Being a widow has been challenging ? ?She has been doing fari last visit but still struggles with amotivation and feels seroquel may be making her too sleepy ? ? ? ?Does not endorse psychotic symptoms ? ?Denies current use of drugs or alcohol although chart suggest history of alcohol intoxication in the past ? ?Aggravating factor being a video.  Single, , finances ? ?Modifying factors some friends, dogs,house ? ? ?Severity ; tiredness, amotivation ?Duration more so related for last 2 years after being a widow ? ? ?Past Psychiatric History: Bipolar, anxiety, grief ? ?Previous Psychotropic Medications: Yes  ? ?Substance Abuse History in the last 12 months:  No. ? ?Consequences of Substance Abuse: ?NA ? ?Past Medical History:  ?Past Medical History:  ?Diagnosis Date  ? Anxiety 03/08/2017  ? Arthritis 03/08/2017  ? Bipolar 1 disorder (HCC) 08/20/2015  ? Overview:  Dr. Chilton Si, [facility]. Now Dr Jordan Likes   Last Assessment & Plan:  Relevant Hx: Course: Daily Update: Today's Plan:  ? Chronic low back pain 07/05/2011  ? Overview:  Dr. Karie Schwalbe. [facility]ix, Orth[facility]  ? Gastroesophageal reflux disease without esophagitis 08/20/2015  ? Insomnia 03/05/2013  ? Migraine without status migrainosus, not intractable 09/19/2012  ? Postmenopausal HRT (hormone  replacement therapy) 08/20/2015  ?  ?Past Surgical History:  ?Procedure Laterality Date  ? CESAREAN SECTION    ? (2)   ? CHOLECYSTECTOMY    ? COLONOSCOPY  2007  ? in [place]- normal exam  ? KNEE SURGERY Bilateral   ? VAGINAL HYSTERECTOMY    ? ? ?Family Psychiatric History: Dad: depression ? ?Family History:  ?Family History  ?Problem Relation Age of Onset  ? Cancer Mother   ?     BONE  ? Depression Father   ? Diabetes Father   ? Depression Sister   ? Lung cancer Sister   ? Uterine cancer Sister   ? Depression Brother   ? Stomach cancer Maternal Grandmother   ? Stroke Other   ? GER disease Other   ? Colon cancer Neg Hx   ? ? ?Social History:   ?Social History  ? ?Socioeconomic History  ? Marital status: Widowed  ?  Spouse name: Not on file  ? Number of children: 2  ? Years of education: Not on file  ? Highest education level: Associate degree: academic program  ?Occupational History  ? Occupation: [employer]  ?  Comment: Working part time  ?Tobacco Use  ? Smoking status: Never  ? Smokeless tobacco: Never  ?Vaping Use  ? Vaping Use: Never used  ?Substance and Sexual Activity  ? Alcohol use: No  ? Drug use: No  ? Sexual activity: Not Currently  ?  Partners: Male  ?  Birth control/protection: None  ?  Other Topics Concern  ? Not on file  ?Social History Narrative  ? Not on file  ? ?Social Determinants of Health  ? ?Financial Resource Strain: Not on file  ?Food Insecurity: Not on file  ?Transportation Needs: Not on file  ?Physical Activity: Not on file  ?Stress: Not on file  ?Social Connections: Not on file  ? ? ?Additional Social History: grew up with mom, parents were divorced, felt she was neglected and endorses bad memories from childhood ? ?Allergies:   ?Allergies  ?Allergen Reactions  ? Other Itching, Nausea And Vomiting and Other (See Comments)  ?  Patient states she has taken this safely before since the one episode of itching  ?Anti-inflammatory - affects liver function ?Patient states she has taken this safely  before since the one episode of itching  ?Anti-inflammatory - affects liver function ? ?Patient states she has taken this safely before since the one episode of itching  ?Anti-inflammatory - affects liver function  ? [place] Itching  ?  Patient states she has taken this safely before since the one episode of itching  ?  ? Sulfa Antibiotics Anaphylaxis  ? Sulfasalazine Anaphylaxis and Swelling  ? Tramadol Nausea And Vomiting and Other (See Comments)  ?  Headaches ? ?  ? Ibuprofen Other (See Comments)  ?  Affects liver functions  ? ? ?Metabolic Disorder Labs: ?No results found for: HGBA1C, MPG ?No results found for: PROLACTIN ?No results found for: CHOL, TRIG, HDL, CHOLHDL, VLDL, LDLCALC ?No results found for: TSH ? ?Therapeutic Level Labs: ?No results found for: LITHIUM ?No results found for: CBMZ ?No results found for: VALPROATE ? ?Current Medications: ?Current Outpatient Medications  ?Medication Sig Dispense Refill  ? QUEtiapine (SEROQUEL) 100 MG tablet Take 1 tablet (100 mg total) by mouth at bedtime. 30 tablet 0  ? busPIRone (BUSPAR) 15 MG tablet Take 1 tablet (15 mg total) by mouth 2 (two) times daily. 60 tablet 3  ? celecoxib (CELEBREX) 200 MG capsule Take 1 capsule (200 mg total) by mouth 2 (two) times daily. 180 capsule 3  ? cetirizine (ZYRTEC) 10 MG tablet Take by mouth.    ? diclofenac Sodium (VOLTAREN) 1 % GEL Apply 2 g topically 4 (four) times daily. 100 g 3  ? DULoxetine (CYMBALTA) 60 MG capsule Take 1 capsule (60 mg total) by mouth daily. 30 capsule 0  ? estradiol (ESTRACE) 1 MG tablet Take 1 tablet (1 mg total) by mouth daily. 90 tablet 3  ? ketoconazole (NIZORAL) 2 % cream Apply 1 application topically 2 (two) times daily. To affected areas. 30 g 1  ? pantoprazole (PROTONIX) 20 MG tablet Take 1 tablet (20 mg total) by mouth daily. 90 tablet 3  ? propranolol (INDERAL) 20 MG tablet TAKE 1 TABLET TWICE DAILY 180 tablet 1  ? QUEtiapine 150 MG TABS Take 150 mg by mouth at bedtime. 30 tablet 0  ?  valACYclovir (VALTREX) 1000 MG tablet Take 1 tablet (1,000 mg total) by mouth 3 (three) times daily. 21 tablet 0  ? ?No current facility-administered medications for this visit.  ? ? ? ?Psychiatric Specialty Exam: ?Review of Systems  ?Cardiovascular:  Negative for chest pain.  ?Neurological:  Negative for tremors.  ?Psychiatric/Behavioral:  Negative for agitation, hallucinations and self-injury.    ?Blood pressure 130/78, temperature 98.6 ?F (37 ?C), height  (1.626 m), weight 165 lb (74.8 kg).Body mass index is 28.32 kg/m?.  ?General Appearance: Casual  ?Eye Contact:  Fair  ?Speech:  Normal Rate  ?  Volume:  Decreased  ?Mood:  somewhat subdued  ?Affect:  Congruent  ?Thought Process:  Goal Directed  ?Orientation:  Full (Time, Place, and Person)  ?Thought Content:  Rumination  ?Suicidal Thoughts:  No  ?Homicidal Thoughts:  No  ?Memory:  Immediate;   Fair  ?Judgement:  Fair  ?Insight:  Shallow  ?Psychomotor Activity:  Decreased  ?Concentration:  Concentration: Fair  ?Recall:  Fair  ?Fund of Knowledge:Good  ?Language: Good  ?Akathisia:  No  ?Handed:    ?AIMS (if indicated):  no involuntary movements  ?Assets:  Desire for Improvement ?Housing  ?ADL's:  Intact  ?Cognition: WNL  ?Sleep:  Fair  ? ?Screenings: ?GAD-7   ? ?Flowsheet Row Office Visit from 03/04/2021 in [facility]  ?Total GAD-7 Score 12  ? ?  ? ?PHQ2-9   ? ?Flowsheet Row Office Visit from 05/01/2021 in [facility] [place] Office Visit from 03/04/2021 in [facility] Office Visit from 02/18/2020 in [facility] Office Visit from 03/08/2017 in [facility]  ?PHQ-2 Total Score 2 2 6 3   ?PHQ-9 Total Score 7 14 13 9   ? ?  ? ?Flowsheet Row Office Visit from 06/16/2021 in [facility] AT [place] Office Visit from 05/01/2021 in [facility] AT  [place]  ?C-SSRS RISK CATEGORY No Risk No Risk  ? ?  ? ? ?Assessment and Plan: as follows ? ?Prior documentation reviewed ? ?Bipolar disorder current episode depressed; : feels seroquele keeps her too sleepy, will lower to 10

## 2021-06-26 ENCOUNTER — Other Ambulatory Visit (HOSPITAL_COMMUNITY): Payer: Self-pay | Admitting: Psychiatry

## 2021-06-26 ENCOUNTER — Other Ambulatory Visit: Payer: Self-pay | Admitting: Family Medicine

## 2021-06-26 ENCOUNTER — Telehealth (INDEPENDENT_AMBULATORY_CARE_PROVIDER_SITE_OTHER): Payer: Medicare PPO | Admitting: Physician Assistant

## 2021-06-26 ENCOUNTER — Encounter: Payer: Self-pay | Admitting: Physician Assistant

## 2021-06-26 VITALS — Ht 64.0 in | Wt 155.0 lb

## 2021-06-26 DIAGNOSIS — M5441 Lumbago with sciatica, right side: Secondary | ICD-10-CM

## 2021-06-26 DIAGNOSIS — G8929 Other chronic pain: Secondary | ICD-10-CM | POA: Diagnosis not present

## 2021-06-26 DIAGNOSIS — M545 Low back pain, unspecified: Secondary | ICD-10-CM

## 2021-06-26 DIAGNOSIS — F319 Bipolar disorder, unspecified: Secondary | ICD-10-CM

## 2021-06-26 DIAGNOSIS — F419 Anxiety disorder, unspecified: Secondary | ICD-10-CM

## 2021-06-26 MED ORDER — HYDROCODONE-ACETAMINOPHEN 5-325 MG PO TABS: 1.0000 | ORAL_TABLET | Freq: Four times a day (QID) | ORAL | 0 refills | Status: DC | PRN

## 2021-06-26 MED ORDER — CYCLOBENZAPRINE HCL 5 MG PO TABS: 5.0000 mg | ORAL_TABLET | Freq: Three times a day (TID) | ORAL | 0 refills | Status: DC | PRN

## 2021-06-26 MED ORDER — PREDNISONE 50 MG PO TABS: ORAL_TABLET | ORAL | 0 refills | Status: DC

## 2021-06-26 NOTE — Progress Notes (Signed)
Pt states her back has been hurting since she strained  last week mopping the floor  pt states she has been taking tylenol and using vapor rub on the back. ? ?Pain is located :lower middle  back ?

## 2021-06-26 NOTE — Progress Notes (Signed)
..Virtual Visit via Video Note ? ?I connected with Amber Hanson on 06/26/21 at  9:30 AM EDT by a video enabled telemedicine application and verified that I am speaking with the correct person using two identifiers. ? ?Location: ?Patient: home ?Provider: clinic ? ?.Participating in visit:  ?Patient: Amber Hanson ?Provider: Tandy Gaw PA-C ?  ?I discussed the limitations of evaluation and management by telemedicine and the availability of in person appointments. The patient expressed understanding and agreed to proceed. ? ?History of Present Illness: ?Pt is a 61 yo female with osteoarthritis and hx of chronic low back pain who presents to the clinic with low back radiating to the right leg behind knee for the last week after mopping and walking up and down a lot of stairs. No fall. No bowel or bladder dysfunction or saddle anesthesia. She has been taking tylenol and vapor rub with little benefit. She does take celebrex daily.  ? ?.. ?Active Ambulatory Problems  ?  Diagnosis Date Noted  ? Acute medial meniscal tear 10/29/2015  ? Allergic rhinitis due to pollen 08/20/2015  ? Anxiety 03/08/2017  ? Bipolar 1 disorder (HCC) 08/20/2015  ? Chronic low back pain 07/05/2011  ? Gastroesophageal reflux disease 08/20/2015  ? Menopausal symptoms 06/12/2012  ? Migraine without status migrainosus, not intractable 09/19/2012  ? Multiple joint pain 08/20/2015  ? Postmenopausal HRT (hormone replacement therapy) 08/20/2015  ? Insomnia 03/05/2013  ? Primary osteoarthritis of first carpometacarpal joint of left hand 09/23/2014  ? Primary osteoarthritis of right knee 07/12/2015  ? S/P left knee arthroscopy 11/11/2015  ? Plantar fasciitis, left 08/16/2016  ? Arthritis 03/08/2017  ? Primary osteoarthritis of right shoulder 04/04/2017  ? Right cervical radiculopathy 04/04/2017  ? H/O hysterectomy for benign disease 04/25/2017  ? Greater trochanteric bursitis of left hip 06/09/2017  ? Tardive dyskinesia 05/10/2019  ? PTSD (post-traumatic stress  disorder) 07/05/2018  ? Bipolar 1 disorder, depressed, full remission (HCC) 05/10/2019  ? Pilar cyst 06/11/2020  ? Right hand pain 07/28/2020  ? 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 ?Resolved Ambulatory Problems  ?  Diagnosis Date Noted  ? Right knee pain 06/11/2020  ? ?Past Medical History:  ?Diagnosis Date  ? Gastroesophageal reflux disease without esophagitis 08/20/2015  ? ? ? ?Observations/Objective: ?No acute distress ?Normal mood and appearance ? ?.. ?Today's Vitals  ? 06/26/21 0919  ?Weight: 155 lb (70.3 kg)  ?Height: 5\' 4"  (1.626 m)  ? ?Body mass index is 26.61 kg/m?. ? ? ? ?Assessment and Plan: ?..Diagnoses and all orders for this visit: ? ?Acute right-sided low back pain with right-sided sciatica ?-     HYDROcodone-acetaminophen (NORCO/VICODIN) 5-325 MG tablet; Take 1 tablet by mouth every 6 (six) hours as needed for moderate pain. ?-     predniSONE (DELTASONE) 50 MG tablet; Take one tablet for 5 days. ?-     cyclobenzaprine (FLEXERIL) 5 MG tablet; Take 1 tablet (5 mg total) by mouth 3 (three) times daily as needed for muscle spasms. ? ?Chronic bilateral low back pain, unspecified whether sciatica present ?-     cyclobenzaprine (FLEXERIL) 5 MG tablet; Take 1 tablet (5 mg total) by mouth 3 (three) times daily as needed for muscle spasms. ? ?Acute flare of chronic pain ?No red flags ?Prednisone burst ?Flexeril as needed pt aware to watch out for sedation ?Heat and icy hot patches ?Discussed stretches ?Norco small quanity as needed for breakthrough pain ? ?..PDMP reviewed during this encounter. ?No concerns last fill 06/2020 ?  Follow up as needed or if symptoms worsen.  ? ?Follow Up Instructions: ? ?  ?I discussed the assessment and treatment plan with the patient. The patient was provided an opportunity to ask questions and all were answered. The patient agreed with the plan and demonstrated an understanding of the instructions. ?  ?The patient was advised to call back  or seek an in-person evaluation if the symptoms worsen or if the condition fails to improve as anticipated. ? ? ? ? ?Tandy Gaw, PA-C ? ?

## 2021-07-08 ENCOUNTER — Other Ambulatory Visit (HOSPITAL_COMMUNITY): Payer: Self-pay

## 2021-07-08 DIAGNOSIS — F319 Bipolar disorder, unspecified: Secondary | ICD-10-CM

## 2021-07-08 DIAGNOSIS — F419 Anxiety disorder, unspecified: Secondary | ICD-10-CM

## 2021-07-08 MED ORDER — DULOXETINE HCL 60 MG PO CPEP: 60.0000 mg | ORAL_CAPSULE | Freq: Every day | ORAL | 0 refills | Status: DC

## 2021-07-15 ENCOUNTER — Other Ambulatory Visit: Payer: Self-pay | Admitting: Osteopathic Medicine

## 2021-07-15 DIAGNOSIS — R58 Hemorrhage, not elsewhere classified: Secondary | ICD-10-CM | POA: Diagnosis not present

## 2021-07-15 DIAGNOSIS — F3189 Other bipolar disorder: Secondary | ICD-10-CM | POA: Diagnosis not present

## 2021-07-15 DIAGNOSIS — G8929 Other chronic pain: Secondary | ICD-10-CM

## 2021-07-15 DIAGNOSIS — Z780 Asymptomatic menopausal state: Secondary | ICD-10-CM

## 2021-07-15 DIAGNOSIS — Z888 Allergy status to other drugs, medicaments and biological substances status: Secondary | ICD-10-CM | POA: Diagnosis not present

## 2021-07-15 DIAGNOSIS — K219 Gastro-esophageal reflux disease without esophagitis: Secondary | ICD-10-CM

## 2021-07-15 DIAGNOSIS — Z882 Allergy status to sulfonamides status: Secondary | ICD-10-CM | POA: Diagnosis not present

## 2021-07-15 DIAGNOSIS — F310 Bipolar disorder, current episode hypomanic: Secondary | ICD-10-CM | POA: Diagnosis not present

## 2021-07-15 DIAGNOSIS — M1990 Unspecified osteoarthritis, unspecified site: Secondary | ICD-10-CM

## 2021-07-15 DIAGNOSIS — R45851 Suicidal ideations: Secondary | ICD-10-CM | POA: Diagnosis not present

## 2021-07-15 DIAGNOSIS — F319 Bipolar disorder, unspecified: Secondary | ICD-10-CM | POA: Diagnosis not present

## 2021-07-15 DIAGNOSIS — Z79890 Hormone replacement therapy (postmenopausal): Secondary | ICD-10-CM

## 2021-07-15 DIAGNOSIS — I10 Essential (primary) hypertension: Secondary | ICD-10-CM | POA: Diagnosis not present

## 2021-07-15 DIAGNOSIS — Z20822 Contact with and (suspected) exposure to covid-19: Secondary | ICD-10-CM | POA: Diagnosis not present

## 2021-07-16 DIAGNOSIS — M170 Bilateral primary osteoarthritis of knee: Secondary | ICD-10-CM | POA: Diagnosis not present

## 2021-07-16 DIAGNOSIS — Z20822 Contact with and (suspected) exposure to covid-19: Secondary | ICD-10-CM | POA: Diagnosis not present

## 2021-07-16 DIAGNOSIS — Z888 Allergy status to other drugs, medicaments and biological substances status: Secondary | ICD-10-CM | POA: Diagnosis not present

## 2021-07-16 DIAGNOSIS — F3189 Other bipolar disorder: Secondary | ICD-10-CM | POA: Diagnosis not present

## 2021-07-16 DIAGNOSIS — T1491XA Suicide attempt, initial encounter: Secondary | ICD-10-CM | POA: Diagnosis not present

## 2021-07-16 DIAGNOSIS — F411 Generalized anxiety disorder: Secondary | ICD-10-CM | POA: Diagnosis not present

## 2021-07-16 DIAGNOSIS — F4310 Post-traumatic stress disorder, unspecified: Secondary | ICD-10-CM | POA: Diagnosis not present

## 2021-07-16 DIAGNOSIS — G8929 Other chronic pain: Secondary | ICD-10-CM | POA: Diagnosis not present

## 2021-07-16 DIAGNOSIS — R9431 Abnormal electrocardiogram [ECG] [EKG]: Secondary | ICD-10-CM | POA: Diagnosis not present

## 2021-07-16 DIAGNOSIS — G43009 Migraine without aura, not intractable, without status migrainosus: Secondary | ICD-10-CM | POA: Diagnosis not present

## 2021-07-16 DIAGNOSIS — M549 Dorsalgia, unspecified: Secondary | ICD-10-CM | POA: Diagnosis not present

## 2021-07-16 DIAGNOSIS — K219 Gastro-esophageal reflux disease without esophagitis: Secondary | ICD-10-CM | POA: Diagnosis not present

## 2021-07-16 DIAGNOSIS — M545 Low back pain, unspecified: Secondary | ICD-10-CM | POA: Diagnosis not present

## 2021-07-16 DIAGNOSIS — R45851 Suicidal ideations: Secondary | ICD-10-CM | POA: Diagnosis not present

## 2021-07-16 DIAGNOSIS — Z882 Allergy status to sulfonamides status: Secondary | ICD-10-CM | POA: Diagnosis not present

## 2021-07-16 DIAGNOSIS — R030 Elevated blood-pressure reading, without diagnosis of hypertension: Secondary | ICD-10-CM | POA: Diagnosis not present

## 2021-07-16 DIAGNOSIS — G43909 Migraine, unspecified, not intractable, without status migrainosus: Secondary | ICD-10-CM | POA: Diagnosis not present

## 2021-07-16 DIAGNOSIS — F314 Bipolar disorder, current episode depressed, severe, without psychotic features: Secondary | ICD-10-CM | POA: Diagnosis not present

## 2021-07-16 DIAGNOSIS — Z79890 Hormone replacement therapy (postmenopausal): Secondary | ICD-10-CM | POA: Diagnosis not present

## 2021-07-17 DIAGNOSIS — Z79890 Hormone replacement therapy (postmenopausal): Secondary | ICD-10-CM | POA: Insufficient documentation

## 2021-07-17 DIAGNOSIS — R9431 Abnormal electrocardiogram [ECG] [EKG]: Secondary | ICD-10-CM | POA: Insufficient documentation

## 2021-07-30 ENCOUNTER — Telehealth (HOSPITAL_COMMUNITY): Payer: Self-pay

## 2021-07-30 NOTE — Telephone Encounter (Signed)
Patient understands Dr. Lynnae Sandhoff will not get message until Monday  Patient says she was told in the hospital to go back to taking 2 Duloxetine 60mg  daily. I told her that she would need to wait and ask Dr. on Monday. Please advise  [facility] [phone]

## 2021-08-03 ENCOUNTER — Telehealth (HOSPITAL_COMMUNITY): Payer: Self-pay

## 2021-08-03 DIAGNOSIS — F419 Anxiety disorder, unspecified: Secondary | ICD-10-CM

## 2021-08-03 DIAGNOSIS — F319 Bipolar disorder, unspecified: Secondary | ICD-10-CM

## 2021-08-03 MED ORDER — DULOXETINE HCL 60 MG PO CPEP: 120.0000 mg | ORAL_CAPSULE | Freq: Every day | ORAL | 0 refills | Status: DC

## 2021-08-03 NOTE — Telephone Encounter (Signed)
F/u  Returning call back to nurse.  

## 2021-08-03 NOTE — Telephone Encounter (Signed)
Medication refill - Patient called back stating she has been taking 2 Cymbalta 60 mg a day, as she thought this is what she was to be taking.  States she took this up until this past Friday but then 1 a day the last 2 days because she was running out. Patient reported she took her last Cymbalta 60 mg dosage this morning and now needs a refill before seeing Dr. Gilmore Laroche next on 08/13/21.  Patient stated she feels she needs 2 a day but requests Dr. Gilmore Laroche send in at least for once a day if he does not agree. Agreed to send information and request to Dr. Gilmore Laroche.

## 2021-08-03 NOTE — Telephone Encounter (Signed)
Medication management - message left for pt, per Dr. Gilmore Laroche to remain on Duloxetine 60 mg, one a day. until she sees him on 08/13/21 and to call back if any issues with this plan.

## 2021-08-03 NOTE — Telephone Encounter (Signed)
New message   Please clarification the direction & amount, patient voiced that she was told it was too soon to refill her medication  1. Which medications need to be refilled? (please list name of each medication and dose if known) DULoxetine (CYMBALTA) 60 MG capsule  2. Which pharmacy/location (including street and city if local pharmacy) is medication to be sent to?[id number] [place],  [address]  3. Do they need a 30 day or 90 day supply? 30 day supply

## 2021-08-03 NOTE — Telephone Encounter (Signed)
Medication management -Telephone call with pt, after Dr. Gilmore Laroche approved patient to go up and to continue taking Duloxetine 60 mg, 2 a day.  E-scribed in this 30 day day order, #60 per authorization by Dr. Gilmore Laroche and reminded pt of need to keep appt 08/13/21 at 1pm in the office.  Also, requested patient call back if any side effects or issues with the medication as patient again verified she had been taking 120 mg a days since her discharge from [facility] with admission date of 07/16/21. Patient stated understanding and thankful Dr. Gilmore Laroche approved continued dosage of 2 of the 60 mg a day and plans to keep appt 08/13/21. [facility] [address] [facility] she requested.

## 2021-08-03 NOTE — Telephone Encounter (Signed)
Medication management - Reviewed pt's recent discharge report from [facility] that had documented pt was discharged on Duloxetine 60 mg, one a day.  Requested pt call back if she has been taking 2 a day and for how long and informed their discharge stated one a day.  Reminded patient also of her follow up appointment with Dr. Gilmore Laroche set for 08/13/21 at 1pm.  Patient to call back if has been taking Duloxetine 60 mg, 2 a day and for how long.

## 2021-08-04 ENCOUNTER — Telehealth: Payer: Medicare PPO | Admitting: Family Medicine

## 2021-08-13 ENCOUNTER — Ambulatory Visit (INDEPENDENT_AMBULATORY_CARE_PROVIDER_SITE_OTHER): Payer: Medicare PPO | Admitting: Psychiatry

## 2021-08-13 ENCOUNTER — Encounter (HOSPITAL_COMMUNITY): Payer: Self-pay | Admitting: Psychiatry

## 2021-08-13 VITALS — BP 98/62 | Ht 64.0 in | Wt 165.0 lb

## 2021-08-13 DIAGNOSIS — F411 Generalized anxiety disorder: Secondary | ICD-10-CM

## 2021-08-13 DIAGNOSIS — F419 Anxiety disorder, unspecified: Secondary | ICD-10-CM | POA: Diagnosis not present

## 2021-08-13 DIAGNOSIS — F319 Bipolar disorder, unspecified: Secondary | ICD-10-CM | POA: Diagnosis not present

## 2021-08-13 MED ORDER — QUETIAPINE FUMARATE 100 MG PO TABS
ORAL_TABLET | ORAL | 2 refills | Status: DC
Start: 2021-08-13 — End: 2021-11-05

## 2021-08-13 MED ORDER — DULOXETINE HCL 60 MG PO CPEP: 120.0000 mg | ORAL_CAPSULE | Freq: Every day | ORAL | 2 refills | Status: DC

## 2021-08-13 NOTE — Progress Notes (Signed)
[facility] follow up visit  Patient Identification: Amber Hanson MRN:  [id number] Date of Evaluation:  08/13/2021 Referral Source: primary care Chief Complaint:   No chief complaint on file.  Visit Diagnosis:    ICD-10-CM   1. Bipolar 1 disorder (HCC)  F31.9 DULoxetine (CYMBALTA) 60 MG capsule    2. Anxiety  F41.9 DULoxetine (CYMBALTA) 60 MG capsule    3. GAD (generalized anxiety disorder)  F41.1            History of Present Illness: Patient is a 61 years old currently widowed Caucasian femaleinitially  referred by primary care to establish care for bipolar disorder and anxiety.  She lives by herself has 2 grown kids  History of hospital admission with OD at [facility]  Being a widow has been challenging  Handling lonliness, meds help  Planning to downsize and sell house, it is challenging to get things done Lowering seroquel has helped , she is too foggy  No side effects    Does not endorse psychotic symptoms  Denies current use of drugs or alcohol although chart suggest history of alcohol intoxication in the past  Aggravating factor being a widow, lonliness  Modifying factors some friends, dogs    Severity ; somewhat subdued Duration more so related for last 2 years after being a widow   Past Psychiatric History: Bipolar, anxiety, grief  Previous Psychotropic Medications: Yes   Substance Abuse History in the last 12 months:  No.  Consequences of Substance Abuse: NA  Past Medical History:  Past Medical History:  Diagnosis Date   Anxiety 03/08/2017   Arthritis 03/08/2017   Bipolar 1 disorder (HCC) 08/20/2015   Overview:  Dr. Chilton Si, [facility]. Now Dr Jordan Likes   Last Assessment & Plan:  Relevant Hx: Course: Daily Update: Today's Plan:   Chronic low back pain 07/05/2011   Overview:  Dr. Karie Schwalbe. [facility]   Gastroesophageal reflux disease without esophagitis 08/20/2015   Insomnia 03/05/2013   Migraine without status migrainosus, not intractable 09/19/2012    Postmenopausal HRT (hormone replacement therapy) 08/20/2015    Past Surgical History:  Procedure Laterality Date   CESAREAN SECTION     (2)    CHOLECYSTECTOMY     COLONOSCOPY  2007   in [place]- normal exam   KNEE SURGERY Bilateral    VAGINAL HYSTERECTOMY      Family Psychiatric History: Dad: depression  Family History:  Family History  Problem Relation Age of Onset   Cancer Mother        BONE   Depression Father    Diabetes Father    Depression Sister    Lung cancer Sister    Uterine cancer Sister    Depression Brother    Stomach cancer Maternal Grandmother    Stroke Other    GER disease Other    Colon cancer Neg Hx     Social History:   Social History   Socioeconomic History   Marital status: Widowed    Spouse name: Not on file   Number of children: 2   Years of education: Not on file   Highest education level: Associate degree: academic program  Occupational History   Occupation: [employer]    Comment: Working part time  Tobacco Use   Smoking status: Never   Smokeless tobacco: Never  Vaping Use   Vaping Use: Never used  Substance and Sexual Activity   Alcohol use: No   Drug use: No   Sexual activity: Not Currently    Partners:  Male    Birth control/protection: None  Other Topics Concern   Not on file  Social History Narrative   Not on file   Social Determinants of Health   Financial Resource Strain: Low Risk  (02/18/2020)   Overall Financial Resource Strain (CARDIA)    Difficulty of Paying Living Expenses: Not hard at all  Food Insecurity: No Food Insecurity (02/18/2020)   Hunger Vital Sign    Worried About Running Out of Food in the Last Year: Never true    Ran Out of Food in the Last Year: Never true  Transportation Needs: No Transportation Needs (02/18/2020)   PRAPARE - Administrator, Civil Service (Medical): No    Lack of Transportation (Non-Medical): No  Physical Activity: Inactive (02/18/2020)   Exercise Vital Sign    Days  of Exercise per Week: 0 days    Minutes of Exercise per Session: 0 min  Stress: No Stress Concern Present (02/18/2020)   [employer] of Occupational Health - Occupational Stress Questionnaire    Feeling of Stress : Only a little  Social Connections: Socially Isolated (02/18/2020)   Social Connection and Isolation Panel [NHANES]    Frequency of Communication with Friends and Family: More than three times a week    Frequency of Social Gatherings with Friends and Family: Once a week    Attends Religious Services: Never    Database administrator or Organizations: No    Attends Banker Meetings: Never    Marital Status: Widowed    Additional Social History: grew up with mom, parents were divorced, felt she was neglected and endorses bad memories from childhood  Allergies:   Allergies  Allergen Reactions   Other Itching, Nausea And Vomiting and Other (See Comments)    Patient states she has taken this safely before since the one episode of itching  Anti-inflammatory - affects liver function Patient states she has taken this safely before since the one episode of itching  Anti-inflammatory - affects liver function  Patient states she has taken this safely before since the one episode of itching  Anti-inflammatory - affects liver function   Red Dye Itching    Patient states she has taken this safely before since the one episode of itching     Sulfa Antibiotics Anaphylaxis   Sulfasalazine Anaphylaxis and Swelling   Tramadol Nausea And Vomiting and Other (See Comments)    Headaches     Ibuprofen Other (See Comments)    Affects liver functions    Metabolic Disorder Labs: No results found for: "HGBA1C", "MPG" No results found for: "PROLACTIN" No results found for: "CHOL", "TRIG", "HDL", "CHOLHDL", "VLDL", "LDLCALC" No results found for: "TSH"  Therapeutic Level Labs: No results found for: "LITHIUM" No results found for: "CBMZ" No results found for:  "VALPROATE"  Current Medications: Current Outpatient Medications  Medication Sig Dispense Refill   busPIRone (BUSPAR) 15 MG tablet TAKE 1 TAB BYB MOUTH TWICE DAILY 60 tablet 3   celecoxib (CELEBREX) 200 MG capsule TAKE 1 CAPSULE TWICE DAILY 180 capsule 0   cetirizine (ZYRTEC) 10 MG tablet Take by mouth.     cyanocobalamin 1000 MCG tablet Take by mouth.     cyclobenzaprine (FLEXERIL) 5 MG tablet Take 1 tablet (5 mg total) by mouth 3 (three) times daily as needed for muscle spasms. 30 tablet 0   diclofenac Sodium (VOLTAREN) 1 % GEL Apply 2 g topically 4 (four) times daily. 100 g 3   estradiol (ESTRACE) 1  MG tablet TAKE 1 TABLET EVERY DAY 90 tablet 0   estradiol (ESTRACE) 1 MG tablet Take by mouth.     HYDROcodone-acetaminophen (NORCO/VICODIN) 5-325 MG tablet Take 1 tablet by mouth every 6 (six) hours as needed for moderate pain. 15 tablet 0   pantoprazole (PROTONIX) 20 MG tablet TAKE 1 TABLET EVERY DAY 90 tablet 0   predniSONE (DELTASONE) 50 MG tablet Take one tablet for 5 days. 5 tablet 0   valACYclovir (VALTREX) 1000 MG tablet Take 1 tablet (1,000 mg total) by mouth 3 (three) times daily. 21 tablet 0   DULoxetine (CYMBALTA) 60 MG capsule Take 2 capsules (120 mg total) by mouth daily. 60 capsule 2   ketoconazole (NIZORAL) 2 % cream Apply 1 application topically 2 (two) times daily. To affected areas. (Patient not taking: Reported on 06/26/2021) 30 g 1   propranolol (INDERAL) 20 MG tablet TAKE 1 TABLET TWICE DAILY (Patient not taking: Reported on 06/26/2021) 180 tablet 1   QUEtiapine (SEROQUEL) 100 MG tablet TAKE 1 TABLET BY MOUTH EVERYDAY AT BEDTIME 30 tablet 2   No current facility-administered medications for this visit.     Psychiatric Specialty Exam: Review of Systems  Cardiovascular:  Negative for chest pain.  Neurological:  Negative for tremors.  Psychiatric/Behavioral:  Negative for agitation, hallucinations and self-injury.     Blood pressure 98/62, height 5\' 4"  (1.626 m), weight  165 lb (74.8 kg).Body mass index is 28.32 kg/m.  General Appearance: Casual  Eye Contact:  Fair  Speech:  Normal Rate  Volume:  Decreased  Mood: fair  Affect:  Congruent  Thought Process:  Goal Directed  Orientation:  Full (Time, Place, and Person)  Thought Content:  Rumination  Suicidal Thoughts:  No  Homicidal Thoughts:  No  Memory:  Immediate;   Fair  Judgement:  Fair  Insight:  Shallow  Psychomotor Activity:  Decreased  Concentration:  Concentration: Fair  Recall:  Fair  Fund of Knowledge:Good  Language: Good  Akathisia:  No  Handed:    AIMS (if indicated):  no involuntary movements  Assets:  Desire for Improvement Housing  ADL's:  Intact  Cognition: WNL  Sleep:  Fair   Screenings: GAD-7    Flowsheet Row Office Visit from 03/04/2021 in [facility]  Total GAD-7 Score 12      PHQ2-9    Flowsheet Row Office Visit from 05/01/2021 in [facility] [place] Office Visit from 03/04/2021 in [facility] Office Visit from 02/18/2020 in [facility] Office Visit from 03/08/2017 in [facility]  PHQ-2 Total Score 2 2 6 3   PHQ-9 Total Score 7 14 13 9       Flowsheet Row Office Visit from 08/13/2021 in [facility] AT [place] Office Visit from 06/16/2021 in [facility] AT [place] Office Visit from 05/01/2021 in [facility] AT [place]  C-SSRS RISK CATEGORY No Risk No Risk No Risk       Assessment and Plan: as follows  Prior documentation reviwed  Bipolar disorder current episode depressed; :lowering seroquel to 100mg  has helped, not too foggy Mood is fair  Continue Cymbalta for depression 60 mg once a day  Generalized anxiety disorder fair continue cymbalta,   Lonliness: handling it, has a friend, continue cymbalta, add  activities   Direct care time spent in office 20  including chart review, documentation. More  then half spent in counseling and coordination of care.   Fu 36m or earlier  Thresa Ross, MD 6/22/20231:13 PM

## 2021-08-21 ENCOUNTER — Encounter: Payer: Self-pay | Admitting: Medical-Surgical

## 2021-08-21 ENCOUNTER — Ambulatory Visit (INDEPENDENT_AMBULATORY_CARE_PROVIDER_SITE_OTHER): Payer: Medicare PPO | Admitting: Medical-Surgical

## 2021-08-21 VITALS — BP 135/96 | HR 86 | Resp 20 | Ht 64.0 in | Wt 169.2 lb

## 2021-08-21 DIAGNOSIS — H01001 Unspecified blepharitis right upper eyelid: Secondary | ICD-10-CM

## 2021-08-21 DIAGNOSIS — H01004 Unspecified blepharitis left upper eyelid: Secondary | ICD-10-CM

## 2021-08-21 MED ORDER — ERYTHROMYCIN 5 MG/GM OP OINT: TOPICAL_OINTMENT | OPHTHALMIC | 0 refills | Status: AC

## 2021-08-21 NOTE — Progress Notes (Signed)
   Established Patient Office Visit  Subjective   Patient ID: Amber Hanson, female   DOB: 05/07/1960 Age: 61 y.o. MRN: [id number]   Chief Complaint  Patient presents with   scaly eyelids    HPI Pleasant 62 year old female presenting today for concerns with scaling on her eyelids.  This has been going on for approximately a month and has been intermittent in nature.  She will have periods of improvement followed by return of symptoms.  Most affected is the crease of her eyelids as well as the outer corner.  She has been using Dove to wash her face and has multiple products that she uses for entire wrinkle and face creams.  She does wear make-up regularly and tries to remove it to the best of her ability.  No vision changes but does note that her eyes are irritated because of the burning and itching of her eyelids.   Objective:    Vitals:   08/21/21 0950  BP: (!) 135/96  Pulse: 86  Resp: 20  Height: 5\' 4"  (1.626 m)  Weight: 169 lb 3.2 oz (76.7 kg)  SpO2: 98%  BMI (Calculated): 29.03   Physical Exam Vitals and nursing note reviewed.  Constitutional:      General: She is not in acute distress.    Appearance: Normal appearance. She is not ill-appearing.  HENT:     Head: Normocephalic and atraumatic.     Mouth/Throat:     Pharynx: Oropharyngeal exudate: .diagm.  Cardiovascular:     Rate and Rhythm: Normal rate and regular rhythm.  Pulmonary:     Effort: Pulmonary effort is normal. No respiratory distress.  Skin:    General: Skin is warm and dry.     Findings: Rash (bilateral eyelids) present.  Neurological:     Mental Status: She is alert and oriented to person, place, and time.  Psychiatric:        Mood and Affect: Mood normal.        Behavior: Behavior normal.        Thought Content: Thought content normal.        Judgment: Judgment normal.   No results found for this or any previous visit (from the past 24 hour(s)).     The 10-year ASCVD risk score (Arnett DK, et al.,  2019) is: 7.4%   Values used to calculate the score:     Age: 20 years     Sex: Female     Is Non-Hispanic African American: No     Diabetic: No     Tobacco smoker: Yes     Systolic Blood Pressure: 135 mmHg     Is BP treated: No     HDL Cholesterol: 77 mg/dL     Total Cholesterol: 229 mg/dL   Assessment & Plan:   1. Blepharitis of upper eyelids of both eyes, unspecified type Recommend hypoallergenic face washes and products.  Recommend removing make-up completely at night or avoiding wearing make-up until this is resolved.  Sending in erythromycin cream to apply to the eyelids 3 times daily for 10 days.  Printed information regarding blepharitis and home treatment provided with patient and discussed verbally.  Return if symptoms worsen or fail to improve.  ___________________________________________ 76, DNP, APRN, FNP-BC Primary Care and Sports Medicine [facility]

## 2021-08-21 NOTE — Patient Instructions (Signed)
Blepharitis Blepharitis refers to inflammation of the eyelids. It is a common condition and can cause dryness or grittiness in the eyes. Other symptoms may include: Reddish, scaly skin around the scalp and eyebrows. Burning or itching of the eyelids. Eye discharge at night that causes the eyelashes to stick together in the morning. Eyelashes that fall out. Redness of the eyes. Sensitivity to light. Follow these instructions at home: Pay attention to any changes in how your eyes look or feel. Tell your health care provider about any changes. Follow these instructions to help with your condition. Keeping clean Wash your hands often with soap and water for at least 20 seconds. Clean your eyes and wash the edges of your eyelids with diluted baby shampoo or commercial eyelid wipes. Do this 2 or more times a day. Wash your face and eyebrows at least once a day. Use a clean towel each time you dry your eyelids. Do not use this towel to clean or dry other areas of your body. Do not share your towel with anyone. General instructions Avoid wearing makeup until you get better. Do not share makeup with anyone. Avoid rubbing your eyes. Use warm compresses on the eyes for 5-10 minutes. Do this 1 or 2 times a day, or as told by your health care provider. You can use warm water on a towel, but a microwaveable heating pad often stays warm longer. The pad should be very warm but not hot enough to burn the skin. If you were prescribed an antibiotic ointment or steroid drops, apply or use the medicine as told by your health care provider. Do not stop using the medicine even if you feel better. Keep all follow-up visits. This is important. Contact a health care provider if: Your eyelids feel hot. You have blisters or a rash on your eyelids. The inflammation gets worse or does not go away in 2-4 days. Get help right away if: You have pain or redness that gets worse or spreads to other parts of your face. Your  vision changes. You have pain when looking at lights or moving objects. You have a fever. Summary Blepharitis refers to inflammation of the eyelids. It can cause dryness and grittiness in the eyes. Pay attention to any changes in how your eyes look or feel. Tell your health care provider about any changes. Follow home care instructions as told by your health care provider. Wash your hands often with soap and water for at least 20 seconds. Avoid wearing makeup. Do not rub your eyes. If you were prescribed an antibiotic ointment or steroid drops, apply or use the medicine as told by your health care provider. Get help right away if you have a fever, vision changes, pain or redness that gets worse or spreads to other parts of your face, or pain when looking at lights or moving objects. This information is not intended to replace advice given to you by your health care provider. Make sure you discuss any questions you have with your health care provider. Document Revised: 03/12/2020 Document Reviewed: 03/12/2020 Elsevier Patient Education  2023 [employer]  

## 2021-10-09 ENCOUNTER — Ambulatory Visit (INDEPENDENT_AMBULATORY_CARE_PROVIDER_SITE_OTHER): Payer: Medicare PPO | Admitting: Medical-Surgical

## 2021-10-09 DIAGNOSIS — Z0000 Encounter for general adult medical examination without abnormal findings: Secondary | ICD-10-CM | POA: Diagnosis not present

## 2021-10-09 DIAGNOSIS — Z1231 Encounter for screening mammogram for malignant neoplasm of breast: Secondary | ICD-10-CM

## 2021-10-09 NOTE — Progress Notes (Signed)
MEDICARE ANNUAL WELLNESS VISIT  10/09/2021  Telephone Visit Disclaimer This Medicare AWV was conducted by telephone due to national recommendations for restrictions regarding the COVID-19 Pandemic (e.g. social distancing).  I verified, using two identifiers, that I am speaking with [name] or their authorized healthcare agent. I discussed the limitations, risks, security, and privacy concerns of performing an evaluation and management service by telephone and the potential availability of an in-person appointment in the future. The patient expressed understanding and agreed to proceed.  Location of Patient: Home Location of Provider (nurse):  In the office.  Subjective:    Clara Herbison is a 61 y.o. female patient of Everrett Coombe, DO who had a Medicare Annual Wellness Visit today via telephone. Avah is Disabled and lives with their family. she has 2 children. she reports that she is socially active and does interact with friends/family regularly. she is minimally physically active and enjoys gardening and painting.  Patient Care Team: Everrett Coombe, DO as PCP - General (Family Medicine)     10/09/2021    1:48 PM 02/18/2020    2:18 PM 08/05/2017    9:41 AM  Advanced Directives  Does Patient Have a Medical Advance Directive? No No No  Would patient like information on creating a medical advance directive? No - Patient declined No - Patient declined No - Patient declined    Hospital Utilization Over the Past 12 Months: # of hospitalizations or ER visits: 2 # of surgeries: 0  Review of Systems    Patient reports that her overall health is better compared to last year.  History obtained from chart review and the patient  Patient Reported Readings (BP, Pulse, CBG, Weight, etc) none  Pain Assessment Pain : No/denies pain     Current Medications & Allergies (verified) Allergies as of 10/09/2021       Reactions   Other Itching, Nausea And Vomiting, Other (See  Comments)   Patient states she has taken this safely before since the one episode of itching  Anti-inflammatory - affects liver function Patient states she has taken this safely before since the one episode of itching  Anti-inflammatory - affects liver function Patient states she has taken this safely before since the one episode of itching  Anti-inflammatory - affects liver function   Red Dye Itching   Patient states she has taken this safely before since the one episode of itching    Sulfa Antibiotics Anaphylaxis   Sulfasalazine Anaphylaxis, Swelling   Tramadol Nausea And Vomiting, Other (See Comments)   Headaches   Ibuprofen Other (See Comments)   Affects liver functions        Medication List        Accurate as of October 09, 2021  1:50 PM. If you have any questions, ask your nurse or doctor.          busPIRone 15 MG tablet Commonly known as: BUSPAR TAKE 1 TAB BYB MOUTH TWICE DAILY   celecoxib 200 MG capsule Commonly known as: CELEBREX TAKE 1 CAPSULE TWICE DAILY   cetirizine 10 MG tablet Commonly known as: ZYRTEC Take by mouth. As needed   cyclobenzaprine 5 MG tablet Commonly known as: FLEXERIL Take 1 tablet (5 mg total) by mouth 3 (three) times daily as needed for muscle spasms.   diclofenac Sodium 1 % Gel Commonly known as: Voltaren Apply 2 g topically 4 (four) times daily.   DULoxetine 60 MG capsule Commonly known as: CYMBALTA Take 2 capsules (120 mg total) by  mouth daily.   erythromycin ophthalmic ointment Apply 1g (pea sized amount) to eyelids three times daily for 10 days.   estradiol 1 MG tablet Commonly known as: ESTRACE TAKE 1 TABLET EVERY DAY   estradiol 1 MG tablet Commonly known as: ESTRACE Take by mouth.   HYDROcodone-acetaminophen 5-325 MG tablet Commonly known as: NORCO/VICODIN Take 1 tablet by mouth every 6 (six) hours as needed for moderate pain.   ketoconazole 2 % cream Commonly known as: NIZORAL Apply 1 application topically  2 (two) times daily. To affected areas.   pantoprazole 20 MG tablet Commonly known as: PROTONIX TAKE 1 TABLET EVERY DAY   predniSONE 50 MG tablet Commonly known as: DELTASONE Take one tablet for 5 days.   propranolol 20 MG tablet Commonly known as: INDERAL TAKE 1 TABLET TWICE DAILY   QUEtiapine 100 MG tablet Commonly known as: SEROQUEL TAKE 1 TABLET BY MOUTH EVERYDAY AT BEDTIME   valACYclovir 1000 MG tablet Commonly known as: VALTREX Take 1 tablet (1,000 mg total) by mouth 3 (three) times daily.        History (reviewed): Past Medical History:  Diagnosis Date   Anxiety 03/08/2017   Arthritis 03/08/2017   Bipolar 1 disorder (HCC) 08/20/2015   Overview:  Dr. Chilton Si, [facility]. Now Dr Jordan Likes   Last Assessment & Plan:  Relevant Hx: Course: Daily Update: Today's Plan:   Chronic low back pain 07/05/2011   Overview:  Dr. Karie Schwalbe. [facility]   Gastroesophageal reflux disease without esophagitis 08/20/2015   Insomnia 03/05/2013   Migraine without status migrainosus, not intractable 09/19/2012   Postmenopausal HRT (hormone replacement therapy) 08/20/2015   Past Surgical History:  Procedure Laterality Date   CESAREAN SECTION     (2)    CHOLECYSTECTOMY     COLONOSCOPY  2007   in [place]- normal exam   KNEE SURGERY Bilateral    VAGINAL HYSTERECTOMY     Family History  Problem Relation Age of Onset   Cancer Mother        BONE   Depression Father    Diabetes Father    Depression Sister    Lung cancer Sister    Uterine cancer Sister    Depression Brother    Stomach cancer Maternal Grandmother    Stroke Other    GER disease Other    Colon cancer Neg Hx    Social History   Socioeconomic History   Marital status: Widowed    Spouse name: Not on file   Number of children: 2   Years of education: 14   Highest education level: Associate degree: academic program  Occupational History   Occupation: [employer]    Comment: Working part time   Occupation: disabled   Tobacco Use   Smoking status: Never   Smokeless tobacco: Never  Vaping Use   Vaping Use: Never used  Substance and Sexual Activity   Alcohol use: No   Drug use: No   Sexual activity: Not Currently    Partners: Male    Birth control/protection: None  Other Topics Concern   Not on file  Social History Narrative   Lives with her dad but will be moving soon. She enjoys gardening and painting.   Social Determinants of Health   Financial Resource Strain: Low Risk  (10/09/2021)   Overall Financial Resource Strain (CARDIA)    Difficulty of Paying Living Expenses: Not hard at all  Food Insecurity: No Food Insecurity (10/09/2021)   Hunger Vital Sign    Worried About  Running Out of Food in the Last Year: Never true    Ran Out of Food in the Last Year: Never true  Transportation Needs: No Transportation Needs (10/09/2021)   PRAPARE - Administrator, Civil Service (Medical): No    Lack of Transportation (Non-Medical): No  Physical Activity: Sufficiently Active (10/09/2021)   Exercise Vital Sign    Days of Exercise per Week: 5 days    Minutes of Exercise per Session: 60 min  Stress: No Stress Concern Present (10/09/2021)   [employer] of Occupational Health - Occupational Stress Questionnaire    Feeling of Stress : Not at all  Social Connections: Socially Isolated (10/09/2021)   Social Connection and Isolation Panel [NHANES]    Frequency of Communication with Friends and Family: More than three times a week    Frequency of Social Gatherings with Friends and Family: More than three times a week    Attends Religious Services: Never    Database administrator or Organizations: No    Attends Banker Meetings: Never    Marital Status: Widowed    Activities of Daily Living    10/09/2021    1:48 PM  In your present state of health, do you have any difficulty performing the following activities:  Hearing? 0  Vision? 0  Difficulty concentrating or making  decisions? 1  Comment occasionally  Walking or climbing stairs? 0  Dressing or bathing? 0  Doing errands, shopping? 0  Preparing Food and eating ? N  Using the Toilet? N  In the past six months, have you accidently leaked urine? N  Do you have problems with loss of bowel control? N  Managing your Medications? N  Managing your Finances? N  Housekeeping or managing your Housekeeping? N    Patient Education/ Literacy How often do you need to have someone help you when you read instructions, pamphlets, or other written materials from your doctor or pharmacy?: 1 - Never What is the last grade level you completed in school?: Associates degree  Exercise Current Exercise Habits: Home exercise routine, Type of exercise: walking, Time (Minutes): 60, Frequency (Times/Week): 5, Weekly Exercise (Minutes/Week): 300, Intensity: Moderate, Exercise limited by: None identified  Diet Patient reports consuming  2-3  meals a day and 0 snack(s) a day Patient reports that her primary diet is: Regular Patient reports that she does have regular access to food.   Depression Screen    10/09/2021    1:42 PM 05/01/2021   11:17 AM 03/04/2021    2:17 PM 02/18/2020    2:24 PM 03/08/2017    9:01 AM  PHQ 2/9 Scores  PHQ - 2 Score 0  2 6 3   PHQ- 9 Score 0  14 13 9      Information is confidential and restricted. Go to Review Flowsheets to unlock data.     Fall Risk    10/09/2021    1:42 PM 02/18/2020    2:24 PM  Fall Risk   Falls in the past year? 1 0  Number falls in past yr: 1 0  Injury with Fall? 1 0  Risk for fall due to : History of fall(s);Impaired balance/gait No Fall Risks  Follow up Falls evaluation completed;Education provided;Falls prevention discussed Falls evaluation completed     Objective:  Cecely Rengel seemed alert and oriented and she participated appropriately during our telephone visit.  Blood Pressure Weight BMI  BP Readings from Last 3 Encounters:  08/21/21 (!) 135/96  03/04/21 124/78  12/25/20 126/74   Wt Readings from Last 3 Encounters:  08/21/21 169 lb 3.2 oz (76.7 kg)  06/26/21 155 lb (70.3 kg)  03/04/21 158 lb (71.7 kg)   BMI Readings from Last 1 Encounters:  08/21/21 29.04 kg/m    *Unable to obtain current vital signs, weight, and BMI due to telephone visit type  Hearing/Vision  Bonita QuinLinda did not seem to have difficulty with hearing/understanding during the telephone conversation Reports that she has not had a formal eye exam by an eye care professional within the past year Reports that she has not had a formal hearing evaluation within the past year *Unable to fully assess hearing and vision during telephone visit type  Cognitive Function:    02/18/2020    2:22 PM  6CIT Screen  What Year? 0 points  What month? 0 points  What time? 0 points  Count back from 20 0 points  Months in reverse 0 points  Repeat phrase 0 points  Total Score 0 points   (Normal:0-7, Significant for Dysfunction: >8)  Normal Cognitive Function Screening: Yes   Immunization & Health Maintenance Record Immunization History  Administered Date(s) Administered   Hepatitis B 11/08/1994, 12/13/1994, 05/30/1995   Hepatitis B, ped/adol 11/08/1994, 12/13/1994, 05/30/1995   Influenza Split 01/13/2011   Influenza, Seasonal, Injecte, Preservative Fre 1959/03/21, 10/02/2015, 10/22/2016   Influenza,inj,Quad PF,6+ Mos 10/22/2016, 11/22/2018, 10/23/2020   Influenza,inj,quad, With Preservative 02/12/2015, 11/29/2017   Influenza-Unspecified 01/13/2011, 12/20/2011, 11/30/2013, 11/30/2013, 02/12/2015, 02/12/2015, 10/02/2015   Moderna Sars-Covid-2 Vaccination 08/18/2019, 09/14/2019   Pneumococcal Polysaccharide-23 09/19/2020   Tdap 07/05/2011, 07/05/2011   Zoster Recombinat (Shingrix) 10/23/2020    Health Maintenance  Topic Date Due   HIV Screening  Never done   Hepatitis C Screening  Never done   COVID-19 Vaccine (3 - Moderna series) 11/09/2019   Zoster Vaccines-  Shingrix (2 of 2) 12/18/2020   TETANUS/TDAP  07/04/2021   INFLUENZA VACCINE  09/22/2021   MAMMOGRAM  06/20/2022   COLONOSCOPY (Pts 45-9066yrs Insurance coverage will need to be confirmed)  12/26/2030   HPV VACCINES  Aged Out       Assessment  This is a routine wellness examination for Black & DeckerLinda Boehler.  Health Maintenance: Due or Overdue Health Maintenance Due  Topic Date Due   HIV Screening  Never done   Hepatitis C Screening  Never done   COVID-19 Vaccine (3 - Moderna series) 11/09/2019   Zoster Vaccines- Shingrix (2 of 2) 12/18/2020   TETANUS/TDAP  07/04/2021   INFLUENZA VACCINE  09/22/2021    Elder NegusLinda Corvino does not need a referral for Community Assistance: Care Management:   no Social Work:    no Prescription Assistance:  no Nutrition/Diabetes Education:  no   Plan:  Personalized Goals  Goals Addressed               This Visit's Progress     Patient Stated (pt-stated)        10/09/2021 AWV Goal: Fall Prevention  Over the next year, patient will decrease their risk for falls by: Using assistive devices, such as a cane or walker, as needed Identifying fall risks within their home and correcting them by: Removing throw rugs Adding handrails to stairs or ramps Removing clutter and keeping a clear pathway throughout the home Increasing light, especially at night Adding shower handles/bars Raising toilet seat Identifying potential personal risk factors for falls: Medication side effects Incontinence/urgency Vestibular dysfunction Hearing loss Musculoskeletal disorders Neurological disorders Orthostatic hypotension  Personalized Health Maintenance & Screening Recommendations  Influenza vaccine Td vaccine Screening mammography Shingrix 2nd dose  Lung Cancer Screening Recommended: no (Low Dose CT Chest recommended if Age 38-80 years, 30 pack-year currently smoking OR have quit w/in past 15 years) Hepatitis C Screening recommended: yes HIV Screening  recommended: yes  Advanced Directives: Written information was not prepared per patient's request.  Referrals & Orders No orders of the defined types were placed in this encounter.   Follow-up Plan Follow-up with Everrett Coombe, DO as planned Schedule your tetanus shot and shingrix 2nd dose at your pharmacy.  Flu shot can be done in the office or at your pharmacy.  Mammogram referral has been sent Medicare wellness visit in one year. AVS printed and mailed to the patient.   I have personally reviewed and noted the following in the patient's chart:   Medical and social history Use of alcohol, tobacco or illicit drugs  Current medications and supplements Functional ability and status Nutritional status Physical activity Advanced directives List of other physicians Hospitalizations, surgeries, and ER visits in previous 12 months Vitals Screenings to include cognitive, depression, and falls Referrals and appointments  In addition, I have reviewed and discussed with [name] certain preventive protocols, quality metrics, and best practice recommendations. A written personalized care plan for preventive services as well as general preventive health recommendations is available and can be mailed to the patient at her request.      Modesto Charon, RN BSN  10/09/2021

## 2021-10-09 NOTE — Patient Instructions (Addendum)
MEDICARE ANNUAL WELLNESS VISIT Health Maintenance Summary and Written Plan of Care  Amber Hanson ,  Thank you for allowing me to perform your Medicare Annual Wellness Visit and for your ongoing commitment to your health.   Health Maintenance & Immunization History Health Maintenance  Topic Date Due  . COVID-19 Vaccine (3 - Moderna series) 10/25/2021 (Originally 11/09/2019)  . Zoster Vaccines- Shingrix (2 of 2) 01/09/2022 (Originally 12/18/2020)  . INFLUENZA VACCINE  05/23/2022 (Originally 09/22/2021)  . TETANUS/TDAP  10/10/2022 (Originally 07/04/2021)  . Hepatitis C Screening  10/10/2022 (Originally 10/26/1977)  . HIV Screening  10/10/2022 (Originally 10/27/1974)  . MAMMOGRAM  06/20/2022  . COLONOSCOPY (Pts 45-80yrs Insurance coverage will need to be confirmed)  12/26/2030  . HPV VACCINES  Aged Out   Immunization History  Administered Date(s) Administered  . Hepatitis B 11/08/1994, 12/13/1994, 05/30/1995  . Hepatitis B, ped/adol 11/08/1994, 12/13/1994, 05/30/1995  . Influenza Split 01/13/2011  . Influenza, Seasonal, Injecte, Preservative Fre 1960/07/28, 10/02/2015, 10/22/2016  . Influenza,inj,Quad PF,6+ Mos 10/22/2016, 11/22/2018, 10/23/2020  . Influenza,inj,quad, With Preservative 02/12/2015, 11/29/2017  . Influenza-Unspecified 01/13/2011, 12/20/2011, 11/30/2013, 11/30/2013, 02/12/2015, 02/12/2015, 10/02/2015  . Moderna Sars-Covid-2 Vaccination 08/18/2019, 09/14/2019  . Pneumococcal Polysaccharide-23 09/19/2020  . Tdap 07/05/2011, 07/05/2011  . Zoster Recombinat (Shingrix) 10/23/2020    These are the patient goals that we discussed:  Goals Addressed              This Visit's Progress   .  Patient Stated (pt-stated)        10/09/2021 AWV Goal: Fall Prevention  Over the next year, patient will decrease their risk for falls by: Using assistive devices, such as a cane or walker, as needed Identifying fall risks within their home and correcting them by: Removing throw  rugs Adding handrails to stairs or ramps Removing clutter and keeping a clear pathway throughout the home Increasing light, especially at night Adding shower handles/bars Raising toilet seat Identifying potential personal risk factors for falls: Medication side effects Incontinence/urgency Vestibular dysfunction Hearing loss Musculoskeletal disorders Neurological disorders Orthostatic hypotension          This is a list of Health Maintenance Items that are overdue or due now: Influenza vaccine Td vaccine Screening mammography Shingrix 2nd dose  Orders/Referrals Placed Today: Orders Placed This Encounter  Procedures  . Mammogram 3D SCREEN BREAST BILATERAL    Standing Status:   Future    Standing Expiration Date:   10/10/2022    Scheduling Instructions:     Please call patient to schedule.    Order Specific Question:   Reason for Exam (SYMPTOM  OR DIAGNOSIS REQUIRED)    Answer:   breast cancer screening    Order Specific Question:   Preferred imaging location?    Answer:   [facility]    (Contact our referral department at [phone] if you have not spoken with someone about your referral appointment within the next 5 days)    Follow-up Plan Follow-up with Everrett Coombe, DO as planned Schedule your tetanus shot and shingrix 2nd dose at your pharmacy.  Flu shot can be done in the office or at your pharmacy.  Mammogram referral has been sent Medicare wellness visit in one year. AVS printed and mailed to the patient.      Health Maintenance, Female Adopting a healthy lifestyle and getting preventive care are important in promoting health and wellness. Ask your health care provider about: The right schedule for you to have regular tests and exams. Things you can do on  your own to prevent diseases and keep yourself healthy. What should I know about diet, weight, and exercise? Eat a healthy diet  Eat a diet that includes plenty of vegetables, fruits,  low-fat dairy products, and lean protein. Do not eat a lot of foods that are high in solid fats, added sugars, or sodium. Maintain a healthy weight Body mass index (BMI) is used to identify weight problems. It estimates body fat based on height and weight. Your health care provider can help determine your BMI and help you achieve or maintain a healthy weight. Get regular exercise Get regular exercise. This is one of the most important things you can do for your health. Most adults should: Exercise for at least 150 minutes each week. The exercise should increase your heart rate and make you sweat (moderate-intensity exercise). Do strengthening exercises at least twice a week. This is in addition to the moderate-intensity exercise. Spend less time sitting. Even light physical activity can be beneficial. Watch cholesterol and blood lipids Have your blood tested for lipids and cholesterol at 62 years of age, then have this test every 5 years. Have your cholesterol levels checked more often if: Your lipid or cholesterol levels are high. You are older than 62 years of age. You are at high risk for heart disease. What should I know about cancer screening? Depending on your health history and family history, you may need to have cancer screening at various ages. This may include screening for: Breast cancer. Cervical cancer. Colorectal cancer. Skin cancer. Lung cancer. What should I know about heart disease, diabetes, and high blood pressure? Blood pressure and heart disease High blood pressure causes heart disease and increases the risk of stroke. This is more likely to develop in people who have high blood pressure readings or are overweight. Have your blood pressure checked: Every 3-5 years if you are 3-76 years of age. Every year if you are 52 years old or older. Diabetes Have regular diabetes screenings. This checks your fasting blood sugar level. Have the screening done: Once every three  years after age 1 if you are at a normal weight and have a low risk for diabetes. More often and at a younger age if you are overweight or have a high risk for diabetes. What should I know about preventing infection? Hepatitis B If you have a higher risk for hepatitis B, you should be screened for this virus. Talk with your health care provider to find out if you are at risk for hepatitis B infection. Hepatitis C Testing is recommended for: Everyone born from [birth year] through 1966. Anyone with known risk factors for hepatitis C. Sexually transmitted infections (STIs) Get screened for STIs, including gonorrhea and chlamydia, if: You are sexually active and are younger than 62 years of age. You are older than 62 years of age and your health care provider tells you that you are at risk for this type of infection. Your sexual activity has changed since you were last screened, and you are at increased risk for chlamydia or gonorrhea. Ask your health care provider if you are at risk. Ask your health care provider about whether you are at high risk for HIV. Your health care provider may recommend a prescription medicine to help prevent HIV infection. If you choose to take medicine to prevent HIV, you should first get tested for HIV. You should then be tested every 3 months for as long as you are taking the medicine. Pregnancy If you are about  to stop having your period (premenopausal) and you may become pregnant, seek counseling before you get pregnant. Take 400 to 800 micrograms (mcg) of folic acid every day if you become pregnant. Ask for birth control (contraception) if you want to prevent pregnancy. Osteoporosis and menopause Osteoporosis is a disease in which the bones lose minerals and strength with aging. This can result in bone fractures. If you are 36 years old or older, or if you are at risk for osteoporosis and fractures, ask your health care provider if you should: Be screened for bone  loss. Take a calcium or vitamin D supplement to lower your risk of fractures. Be given hormone replacement therapy (HRT) to treat symptoms of menopause. Follow these instructions at home: Alcohol use Do not drink alcohol if: Your health care provider tells you not to drink. You are pregnant, may be pregnant, or are planning to become pregnant. If you drink alcohol: Limit how much you have to: 0-1 drink a day. Know how much alcohol is in your drink. In the [place] one drink equals one 12 oz bottle of beer (355 mL), one 5 oz glass of wine (148 mL), or one 1 oz glass of hard liquor (44 mL). Lifestyle Do not use any products that contain nicotine or tobacco. These products include cigarettes, chewing tobacco, and vaping devices, such as e-cigarettes. If you need help quitting, ask your health care provider. Do not use street drugs. Do not share needles. Ask your health care provider for help if you need support or information about quitting drugs. General instructions Schedule regular health, dental, and eye exams. Stay current with your vaccines. Tell your health care provider if: You often feel depressed. You have ever been abused or do not feel safe at home. Summary Adopting a healthy lifestyle and getting preventive care are important in promoting health and wellness. Follow your health care provider's instructions about healthy diet, exercising, and getting tested or screened for diseases. Follow your health care provider's instructions on monitoring your cholesterol and blood pressure. This information is not intended to replace advice given to you by your health care provider. Make sure you discuss any questions you have with your health care provider. Document Revised: 06/30/2020 Document Reviewed: 06/30/2020 Elsevier Patient Education  2023 [employer].

## 2021-11-05 ENCOUNTER — Telehealth (HOSPITAL_COMMUNITY): Payer: Self-pay | Admitting: Psychiatry

## 2021-11-05 ENCOUNTER — Ambulatory Visit (INDEPENDENT_AMBULATORY_CARE_PROVIDER_SITE_OTHER): Payer: Medicare PPO | Admitting: Psychiatry

## 2021-11-05 ENCOUNTER — Encounter (HOSPITAL_COMMUNITY): Payer: Self-pay | Admitting: Psychiatry

## 2021-11-05 VITALS — BP 130/82 | Temp 98.3°F | Ht 64.0 in | Wt 158.0 lb

## 2021-11-05 DIAGNOSIS — F419 Anxiety disorder, unspecified: Secondary | ICD-10-CM

## 2021-11-05 DIAGNOSIS — F319 Bipolar disorder, unspecified: Secondary | ICD-10-CM | POA: Diagnosis not present

## 2021-11-05 DIAGNOSIS — F411 Generalized anxiety disorder: Secondary | ICD-10-CM | POA: Diagnosis not present

## 2021-11-05 DIAGNOSIS — F5102 Adjustment insomnia: Secondary | ICD-10-CM | POA: Diagnosis not present

## 2021-11-05 MED ORDER — DULOXETINE HCL 60 MG PO CPEP: 120.0000 mg | ORAL_CAPSULE | Freq: Every day | ORAL | 2 refills | Status: DC

## 2021-11-05 MED ORDER — QUETIAPINE FUMARATE 100 MG PO TABS: ORAL_TABLET | ORAL | 2 refills | Status: DC

## 2021-11-05 NOTE — Progress Notes (Signed)
[facility] follow up visit  Patient Identification: Amber Hanson MRN:  [id number] Date of Evaluation:  11/05/2021 Referral Source: primary care Chief Complaint:   No chief complaint on file.  Visit Diagnosis:    ICD-10-CM   1. Bipolar 1 disorder (HCC)  F31.9 DULoxetine (CYMBALTA) 60 MG capsule    2. Anxiety  F41.9 DULoxetine (CYMBALTA) 60 MG capsule    3. GAD (generalized anxiety disorder)  F41.1     4. Adjustment insomnia  F51.02            History of Present Illness: Patient is a 61 years old currently widowed Caucasian femaleinitially  referred by primary care to establish care for bipolar disorder and anxiety.  She lives by herself has 2 grown kids  History of hospital admission with OD at [facility]  Being a widow has been challenging Overall doing fair with meds,  Had a friend who ws living with her but emotionally upsetting, he is now in prison  She understands to take legal action if he threatens to come back  Doing fair   Handling lonliness, meds help  Has down sized house,  Seroquel helps sleep . No tremors    Does not endorse psychotic symptoms  Denies current use of drugs or alcohol although chart suggest history of alcohol intoxication in the past  Aggravating factor being a widow,lonliness  Modifying factors some friends, dogs    Severity ; somewhat subdued Duration more so related for last 2 years after being a widow   Past Psychiatric History: Bipolar, anxiety, grief  Previous Psychotropic Medications: Yes   Substance Abuse History in the last 12 months:  No.  Consequences of Substance Abuse: NA  Past Medical History:  Past Medical History:  Diagnosis Date   Anxiety 03/08/2017   Arthritis 03/08/2017   Bipolar 1 disorder (HCC) 08/20/2015   Overview:  Dr. Chilton Si, [facility]. Now Dr Jordan Likes   Last Assessment & Plan:  Relevant Hx: Course: Daily Update: Today's Plan:   Chronic low back pain 07/05/2011   Overview:  Dr. Karie Schwalbe. [facility]    Gastroesophageal reflux disease without esophagitis 08/20/2015   Insomnia 03/05/2013   Migraine without status migrainosus, not intractable 09/19/2012   Postmenopausal HRT (hormone replacement therapy) 08/20/2015    Past Surgical History:  Procedure Laterality Date   CESAREAN SECTION     (2)    CHOLECYSTECTOMY     COLONOSCOPY  2007   in [place]- normal exam   KNEE SURGERY Bilateral    VAGINAL HYSTERECTOMY      Family Psychiatric History: Dad: depression  Family History:  Family History  Problem Relation Age of Onset   Cancer Mother        BONE   Depression Father    Diabetes Father    Depression Sister    Lung cancer Sister    Uterine cancer Sister    Depression Brother    Stomach cancer Maternal Grandmother    Stroke Other    GER disease Other    Colon cancer Neg Hx     Social History:   Social History   Socioeconomic History   Marital status: Widowed    Spouse name: Not on file   Number of children: 2   Years of education: 14   Highest education level: Associate degree: academic program  Occupational History   Occupation: [employer]    Comment: Working part time   Occupation: disabled  Tobacco Use   Smoking status: Never   Smokeless tobacco: Never  Vaping Use   Vaping Use: Never used  Substance and Sexual Activity   Alcohol use: No   Drug use: No   Sexual activity: Not Currently    Partners: Male    Birth control/protection: None  Other Topics Concern   Not on file  Social History Narrative   Lives with her dad but will be moving soon. She enjoys gardening and painting.   Social Determinants of Health   Financial Resource Strain: Low Risk  (10/09/2021)   Overall Financial Resource Strain (CARDIA)    Difficulty of Paying Living Expenses: Not hard at all  Food Insecurity: No Food Insecurity (10/09/2021)   Hunger Vital Sign    Worried About Running Out of Food in the Last Year: Never true    Ran Out of Food in the Last Year: Never true  Transportation  Needs: No Transportation Needs (10/09/2021)   PRAPARE - Administrator, Civil Service (Medical): No    Lack of Transportation (Non-Medical): No  Physical Activity: Sufficiently Active (10/09/2021)   Exercise Vital Sign    Days of Exercise per Week: 5 days    Minutes of Exercise per Session: 60 min  Stress: No Stress Concern Present (10/09/2021)   [employer] of Occupational Health - Occupational Stress Questionnaire    Feeling of Stress : Not at all  Social Connections: Socially Isolated (10/09/2021)   Social Connection and Isolation Panel [NHANES]    Frequency of Communication with Friends and Family: More than three times a week    Frequency of Social Gatherings with Friends and Family: More than three times a week    Attends Religious Services: Never    Database administrator or Organizations: No    Attends Banker Meetings: Never    Marital Status: Widowed    Additional Social History: grew up with mom, parents were divorced, felt she was neglected and endorses bad memories from childhood  Allergies:   Allergies  Allergen Reactions   Other Itching, Nausea And Vomiting and Other (See Comments)    Patient states she has taken this safely before since the one episode of itching  Anti-inflammatory - affects liver function Patient states she has taken this safely before since the one episode of itching  Anti-inflammatory - affects liver function  Patient states she has taken this safely before since the one episode of itching  Anti-inflammatory - affects liver function   Red Dye Itching    Patient states she has taken this safely before since the one episode of itching     Sulfa Antibiotics Anaphylaxis   Sulfasalazine Anaphylaxis and Swelling   Tramadol Nausea And Vomiting and Other (See Comments)    Headaches     Ibuprofen Other (See Comments)    Affects liver functions    Metabolic Disorder Labs: No results found for: "HGBA1C", "MPG" No  results found for: "PROLACTIN" No results found for: "CHOL", "TRIG", "HDL", "CHOLHDL", "VLDL", "LDLCALC" No results found for: "TSH"  Therapeutic Level Labs: No results found for: "LITHIUM" No results found for: "CBMZ" No results found for: "VALPROATE"  Current Medications: Current Outpatient Medications  Medication Sig Dispense Refill   busPIRone (BUSPAR) 15 MG tablet TAKE 1 TAB BYB MOUTH TWICE DAILY 60 tablet 3   celecoxib (CELEBREX) 200 MG capsule TAKE 1 CAPSULE TWICE DAILY 180 capsule 0   cetirizine (ZYRTEC) 10 MG tablet Take by mouth. As needed     cyclobenzaprine (FLEXERIL) 5 MG tablet Take 1 tablet (5 mg  total) by mouth 3 (three) times daily as needed for muscle spasms. 30 tablet 0   diclofenac Sodium (VOLTAREN) 1 % GEL Apply 2 g topically 4 (four) times daily. 100 g 3   erythromycin ophthalmic ointment Apply 1g (pea sized amount) to eyelids three times daily for 10 days. 30 g 0   estradiol (ESTRACE) 1 MG tablet TAKE 1 TABLET EVERY DAY 90 tablet 0   pantoprazole (PROTONIX) 20 MG tablet TAKE 1 TABLET EVERY DAY 90 tablet 0   DULoxetine (CYMBALTA) 60 MG capsule Take 2 capsules (120 mg total) by mouth daily. 60 capsule 2   estradiol (ESTRACE) 1 MG tablet Take by mouth. (Patient not taking: Reported on 10/09/2021)     HYDROcodone-acetaminophen (NORCO/VICODIN) 5-325 MG tablet Take 1 tablet by mouth every 6 (six) hours as needed for moderate pain. (Patient not taking: Reported on 10/09/2021) 15 tablet 0   ketoconazole (NIZORAL) 2 % cream Apply 1 application topically 2 (two) times daily. To affected areas. (Patient not taking: Reported on 06/26/2021) 30 g 1   predniSONE (DELTASONE) 50 MG tablet Take one tablet for 5 days. (Patient not taking: Reported on 10/09/2021) 5 tablet 0   propranolol (INDERAL) 20 MG tablet TAKE 1 TABLET TWICE DAILY (Patient not taking: Reported on 06/26/2021) 180 tablet 1   QUEtiapine (SEROQUEL) 100 MG tablet TAKE 1 TABLET BY MOUTH EVERYDAY AT BEDTIME 30 tablet 2    valACYclovir (VALTREX) 1000 MG tablet Take 1 tablet (1,000 mg total) by mouth 3 (three) times daily. (Patient not taking: Reported on 10/09/2021) 21 tablet 0   No current facility-administered medications for this visit.     Psychiatric Specialty Exam: Review of Systems  Cardiovascular:  Negative for chest pain.  Neurological:  Negative for tremors.  Psychiatric/Behavioral:  Negative for agitation, hallucinations and self-injury.     Blood pressure 130/82, temperature 98.3 F (36.8 C), height 5\' 4"  (1.626 m), weight 158 lb (71.7 kg).Body mass index is 27.12 kg/m.  General Appearance: Casual  Eye Contact:  Fair  Speech:  Normal Rate  Volume:  Decreased  Mood: fair  Affect:  Congruent  Thought Process:  Goal Directed  Orientation:  Full (Time, Place, and Person)  Thought Content:  Rumination  Suicidal Thoughts:  No  Homicidal Thoughts:  No  Memory:  Immediate;   Fair  Judgement:  Fair  Insight:  Shallow  Psychomotor Activity:  Decreased  Concentration:  Concentration: Fair  Recall:  Fair  Fund of Knowledge:Good  Language: Good  Akathisia:  No  Handed:    AIMS (if indicated):  no involuntary movements  Assets:  Desire for Improvement Housing  ADL's:  Intact  Cognition: WNL  Sleep:  Fair   Screenings: GAD-7    Flowsheet Row Office Visit from 03/04/2021 in [facility]  Total GAD-7 Score 12      PHQ2-9    Flowsheet Row Clinical Support from 10/09/2021 in [facility] Office Visit from 05[facility]01/2021 in [facility] [place] Office Visit from 03/04/2021 in [facility] Office Visit from 02/18/2020 in [facility] Office Visit from 03/08/2017 in [facility]  PHQ-2 Total Score 0 2 2 6 3   PHQ-9 Total Score 0 7 14 13 9       Flowsheet Row Office Visit from 08/13/2021 in  [facility] AT [place] Office Visit from 06/16/2021 in [facility] AT [place] Office Visit from 05/01/2021 in [facility] AT [place]  C-SSRS RISK CATEGORY No Risk No Risk No Risk       Assessment and Plan: as follows  Prior documentation reviewed  Bipolar disorder current episode depressed; :doing fair continue seroquel, cymbalta  Continue Cymbalta for depression 60 mg once a day  Generalized anxiety disorder: manageable continue cymbalta and buspar  Lonliness: handling it better, her friend was adding stress  Insomnia: reviewed sleep hygiene , seroquel helps with sleep can continue, less foggy with lower dose now Direct care time spent in office 20 min including face to face and charting Fu 45m.   Thresa Ross, MD 9/14/20232:44 PM

## 2021-11-05 NOTE — Telephone Encounter (Signed)
Patient called stating she had given provider incorrect pharmacy information during today's appointment. States she needs medications sent to:   [facility] [address] [place], [place] [address] [phone]

## 2021-11-11 DIAGNOSIS — S80811A Abrasion, right lower leg, initial encounter: Secondary | ICD-10-CM | POA: Diagnosis not present

## 2021-11-11 DIAGNOSIS — S99921A Unspecified injury of right foot, initial encounter: Secondary | ICD-10-CM | POA: Diagnosis not present

## 2021-11-11 DIAGNOSIS — M7989 Other specified soft tissue disorders: Secondary | ICD-10-CM | POA: Diagnosis not present

## 2021-11-11 DIAGNOSIS — S90811A Abrasion, right foot, initial encounter: Secondary | ICD-10-CM | POA: Diagnosis not present

## 2021-11-11 DIAGNOSIS — S8991XA Unspecified injury of right lower leg, initial encounter: Secondary | ICD-10-CM | POA: Diagnosis not present

## 2021-11-11 DIAGNOSIS — M79671 Pain in right foot: Secondary | ICD-10-CM | POA: Diagnosis not present

## 2021-11-13 IMAGING — DX DG KNEE COMPLETE 4+V*R*
4 series · 4 of 4 positions shown · non-contrast
Comparison: None.

CLINICAL DATA: Chronic right knee pain

EXAM:
LEFT KNEE - 1-2 VIEW; RIGHT KNEE - COMPLETE 4+ VIEW

[tunnel]
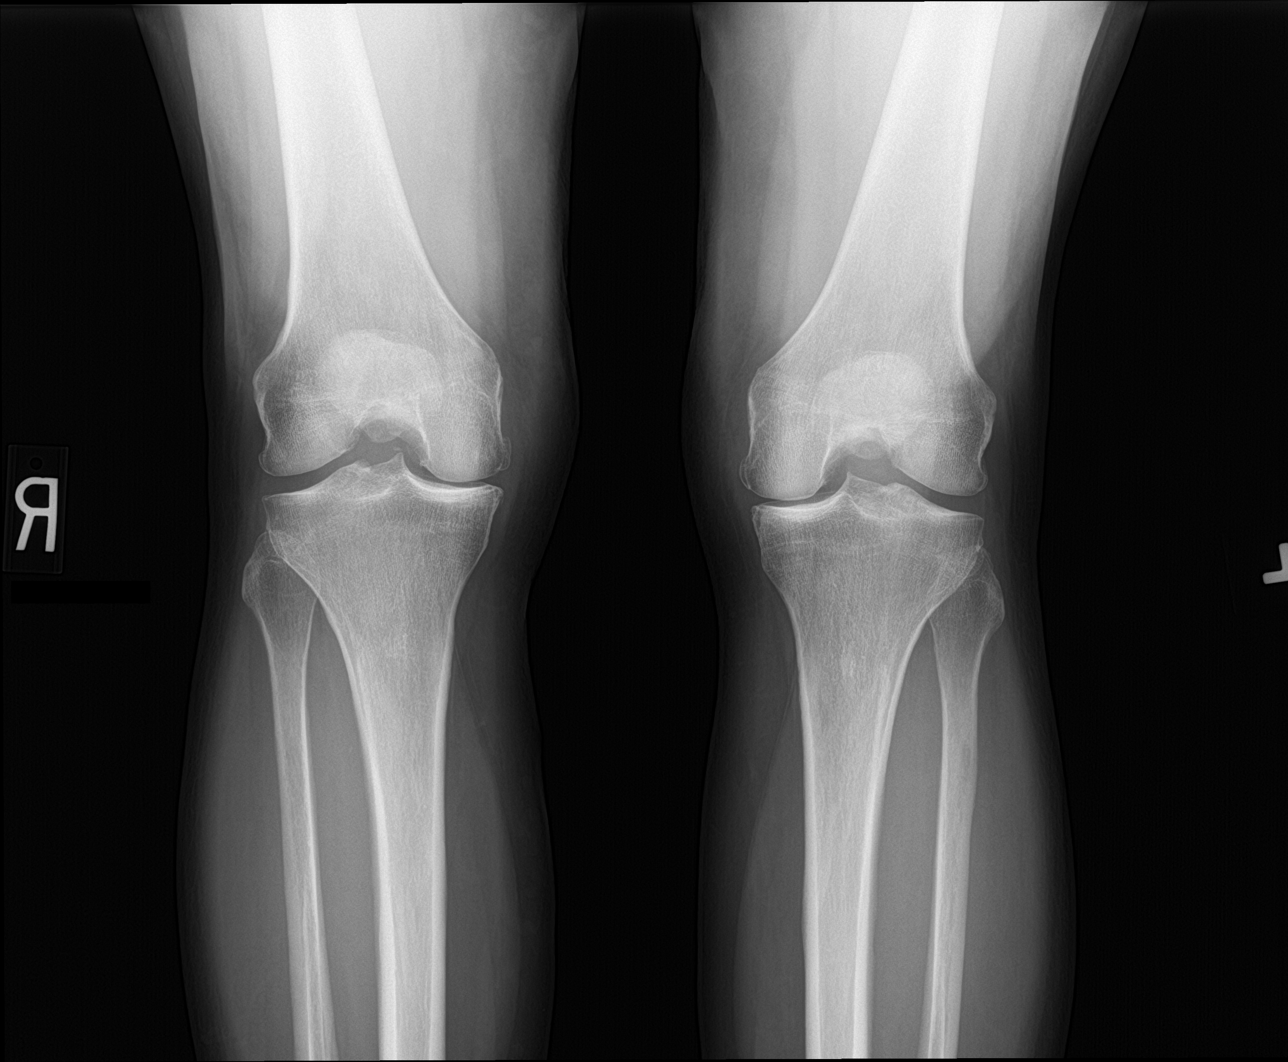

[knee lat]
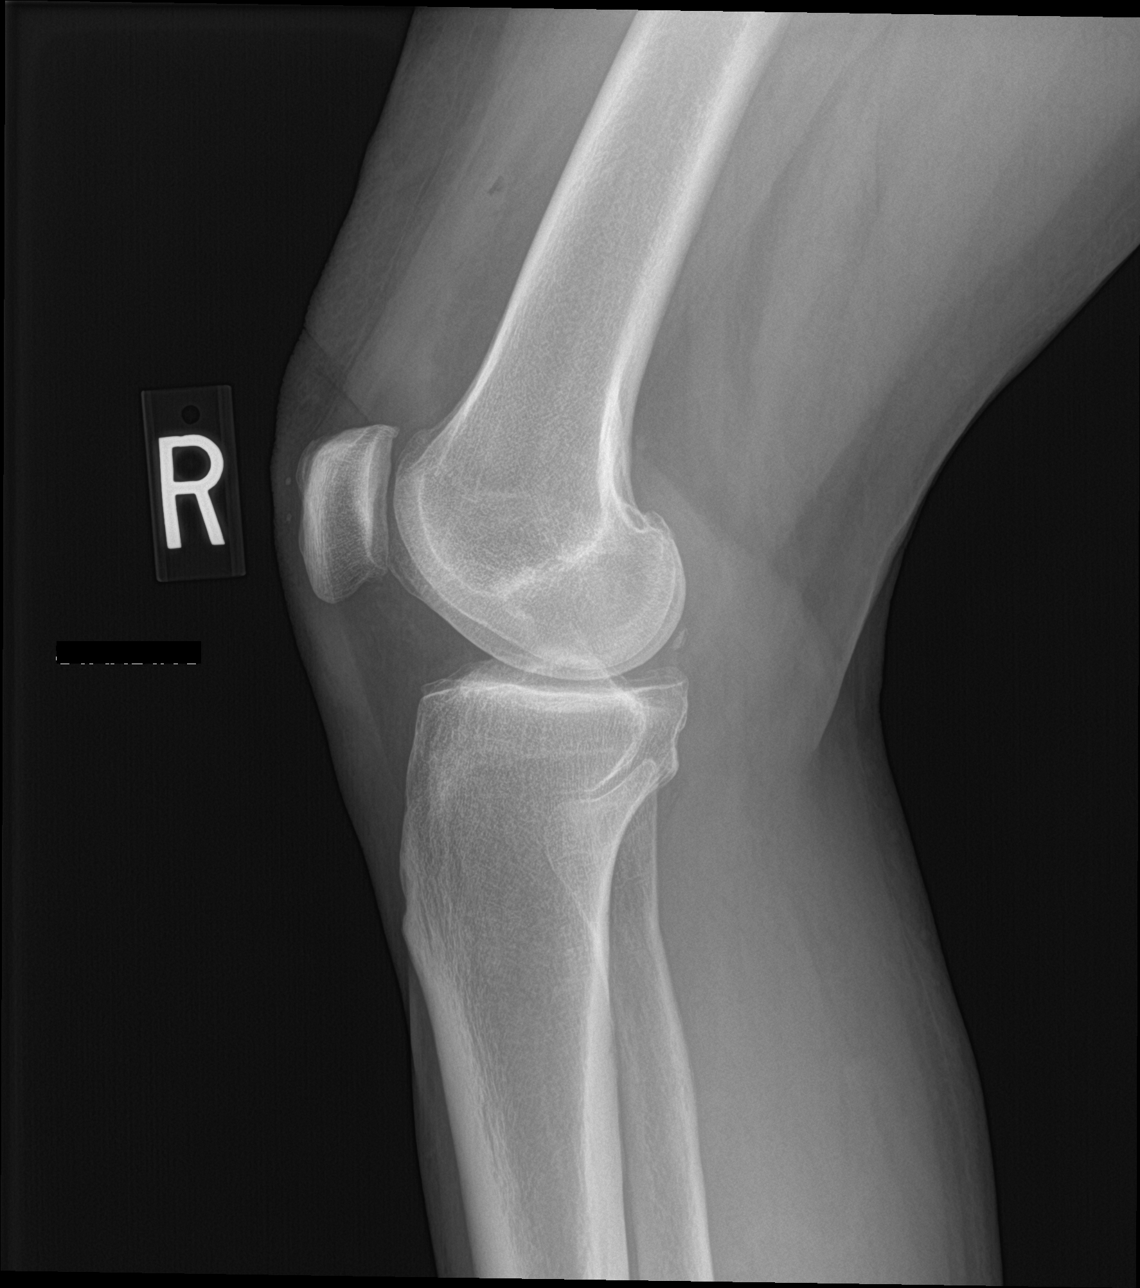

[knee sunrise]
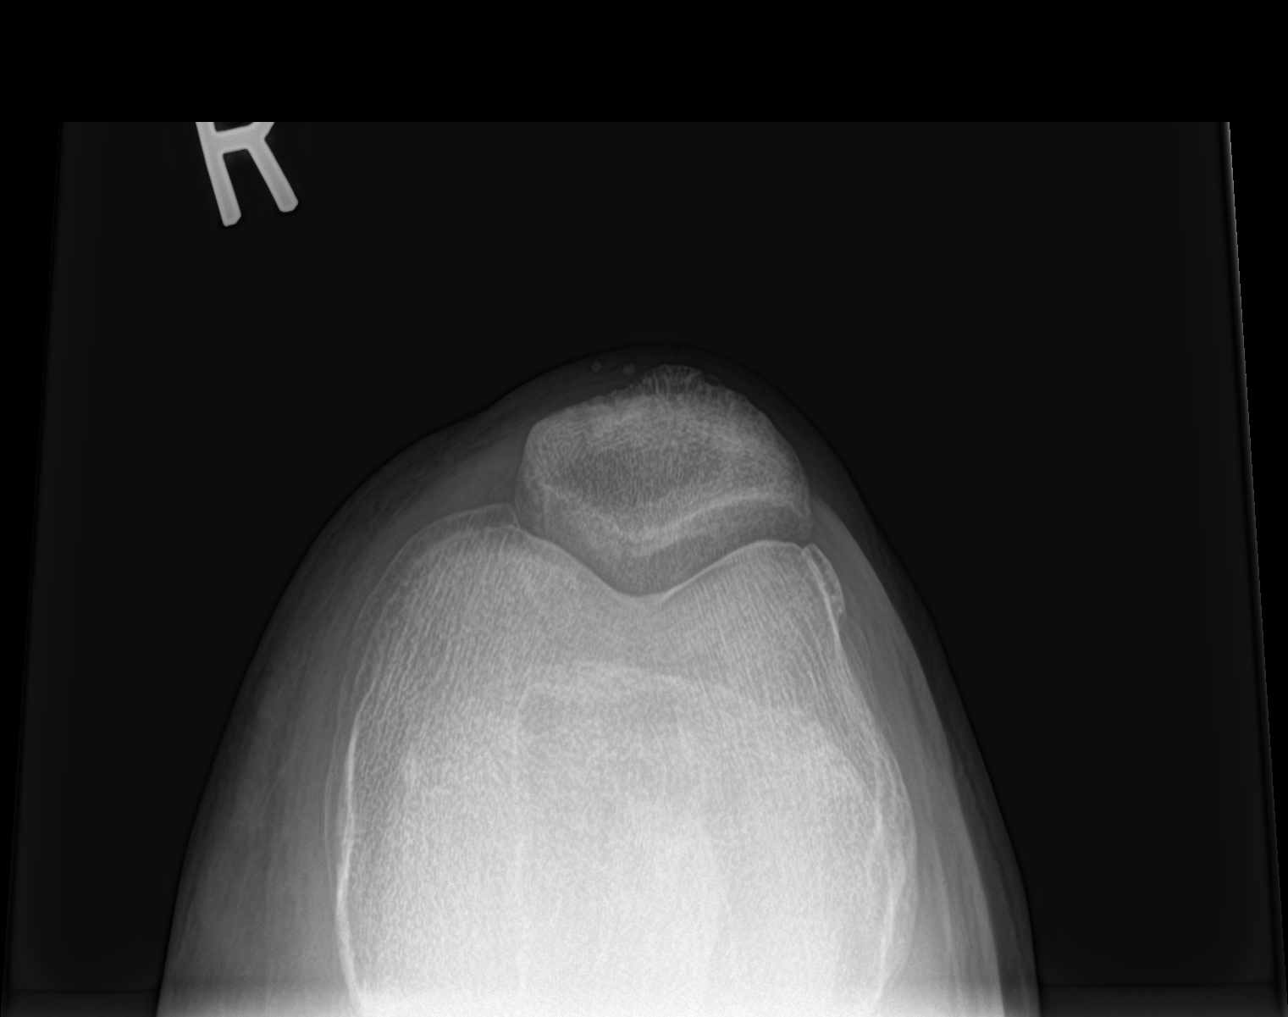

[knee ap bilat standing]
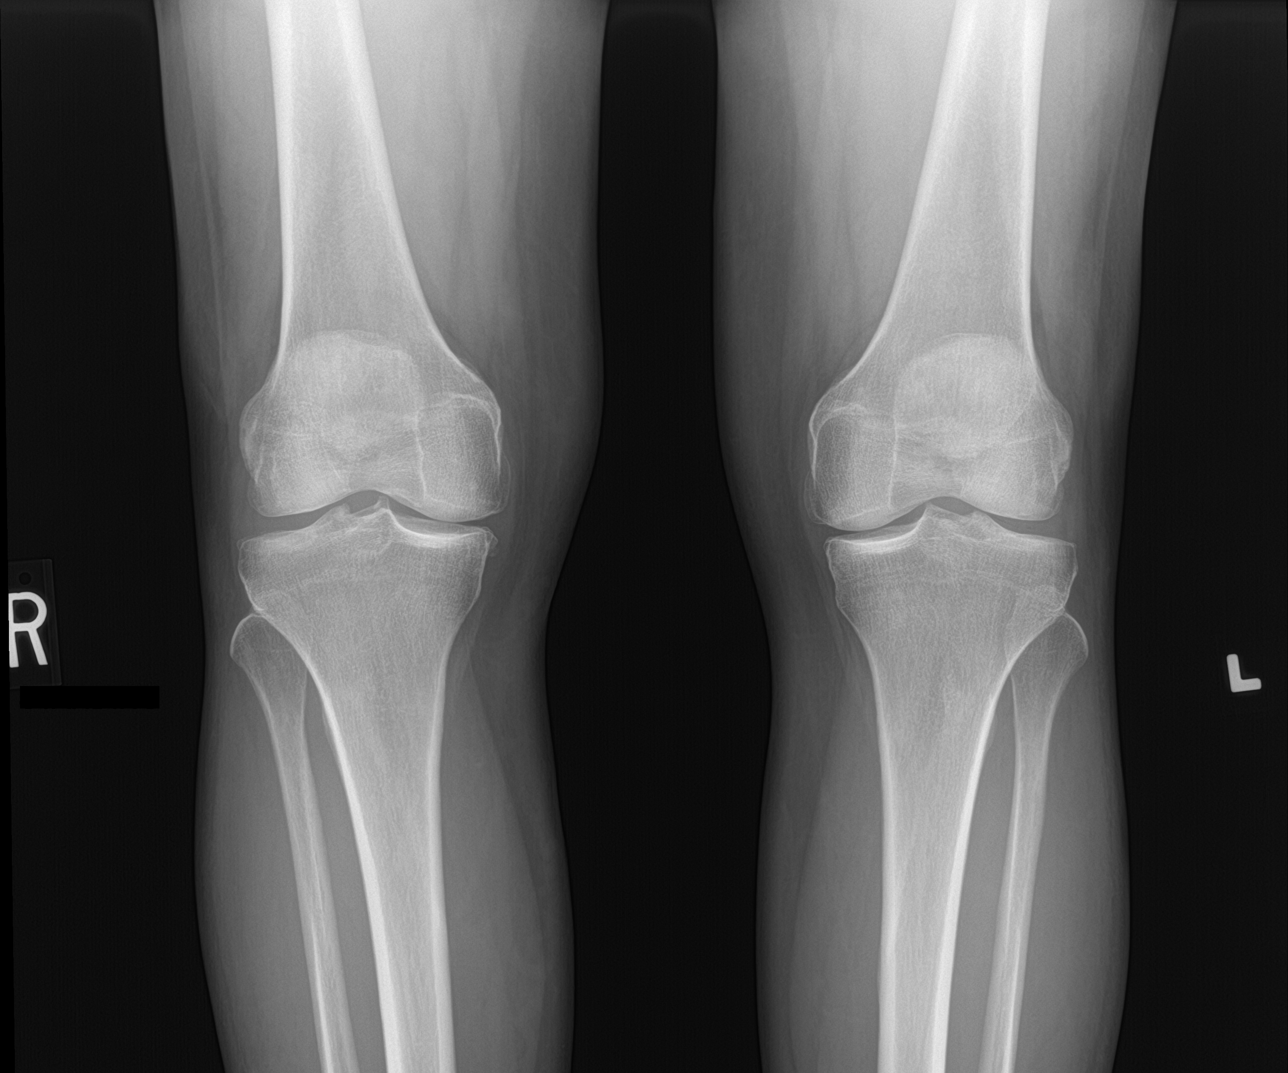

[4 of 4 positions shown; findings below may reference images not displayed]

FINDINGS: Mild spurring and joint space loss of the left knee. Visualized soft
tissues are unremarkable.

Mild joint space loss and spurring of the medial compartment of the
right knee. Mild spurring of the patellofemoral compartment of the
right knee. Minimal right knee joint effusion.
IMPRESSION: Mild degenerative changes of the right knee predominantly involving
the medial compartment.

## 2021-11-13 IMAGING — DX DG HAND COMPLETE 3+V*R*
3 series · 3 of 3 positions shown · non-contrast
Comparison: None.

CLINICAL DATA: Dorsal right hand pain over the mid third metacarpal

EXAM:
RIGHT HAND - COMPLETE 3+ VIEW

[hand pa]
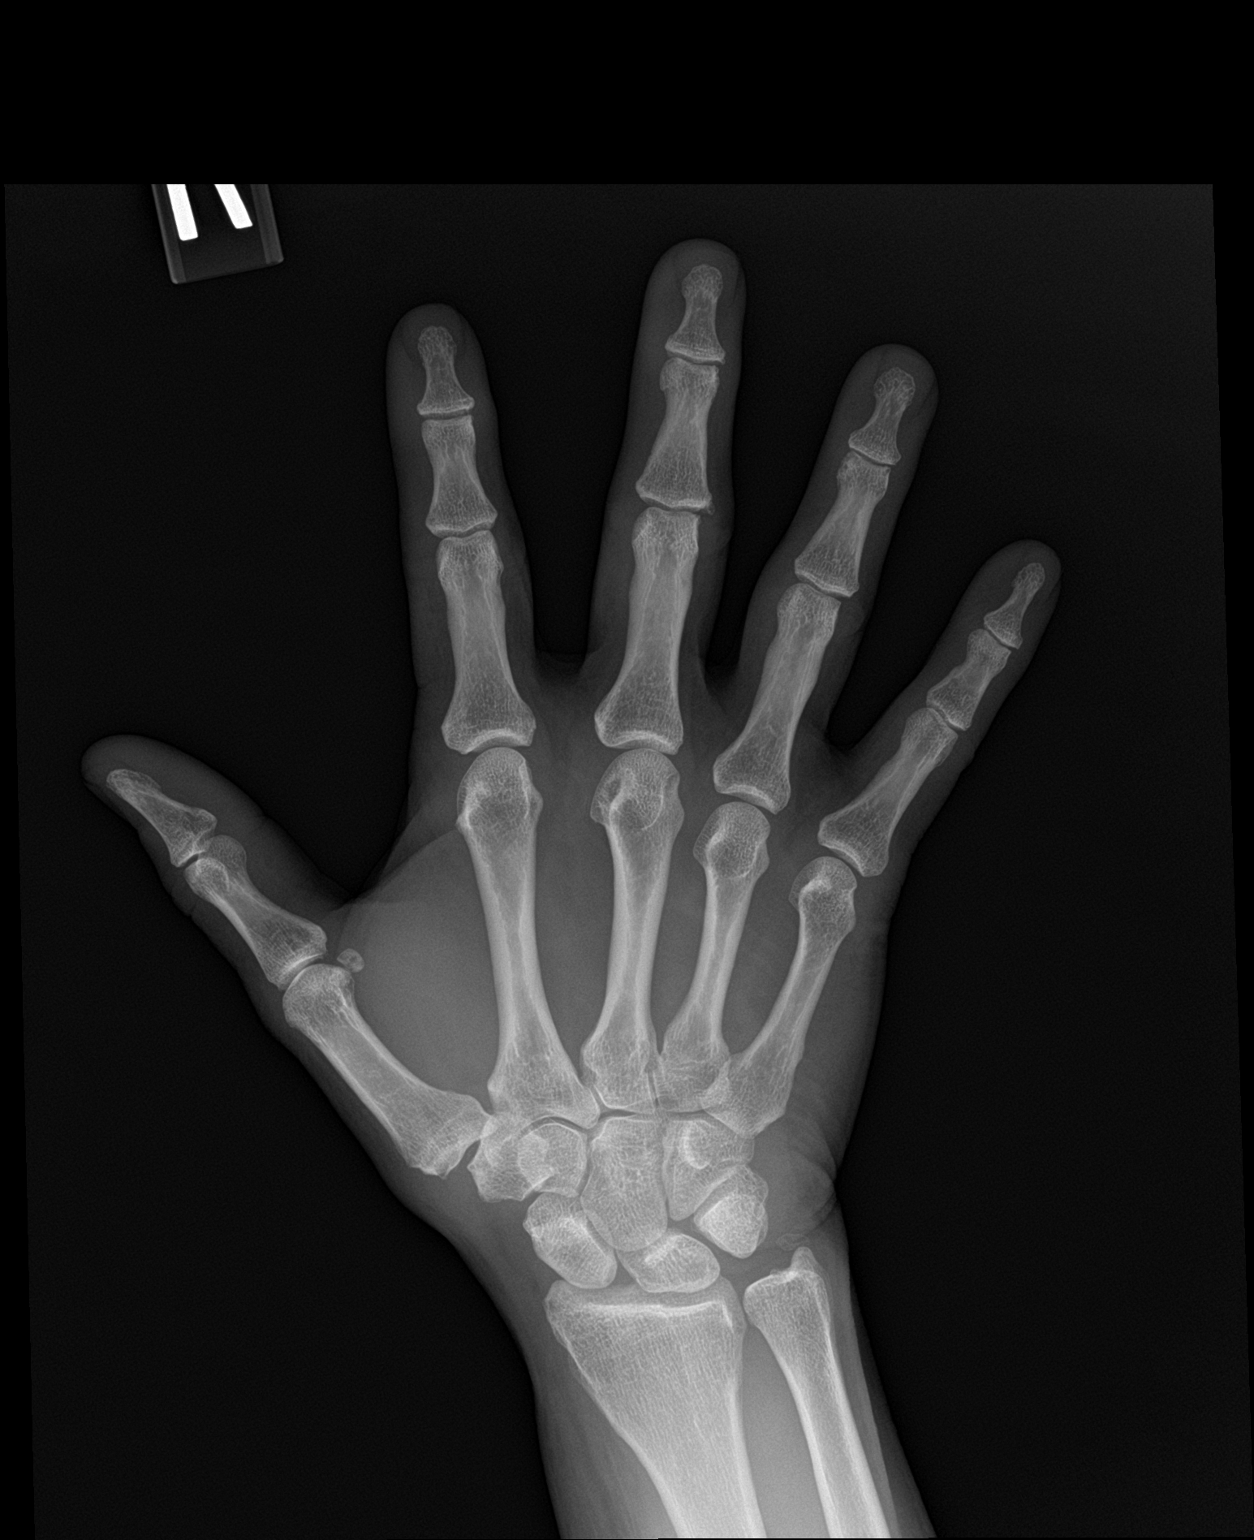

[hand obl]
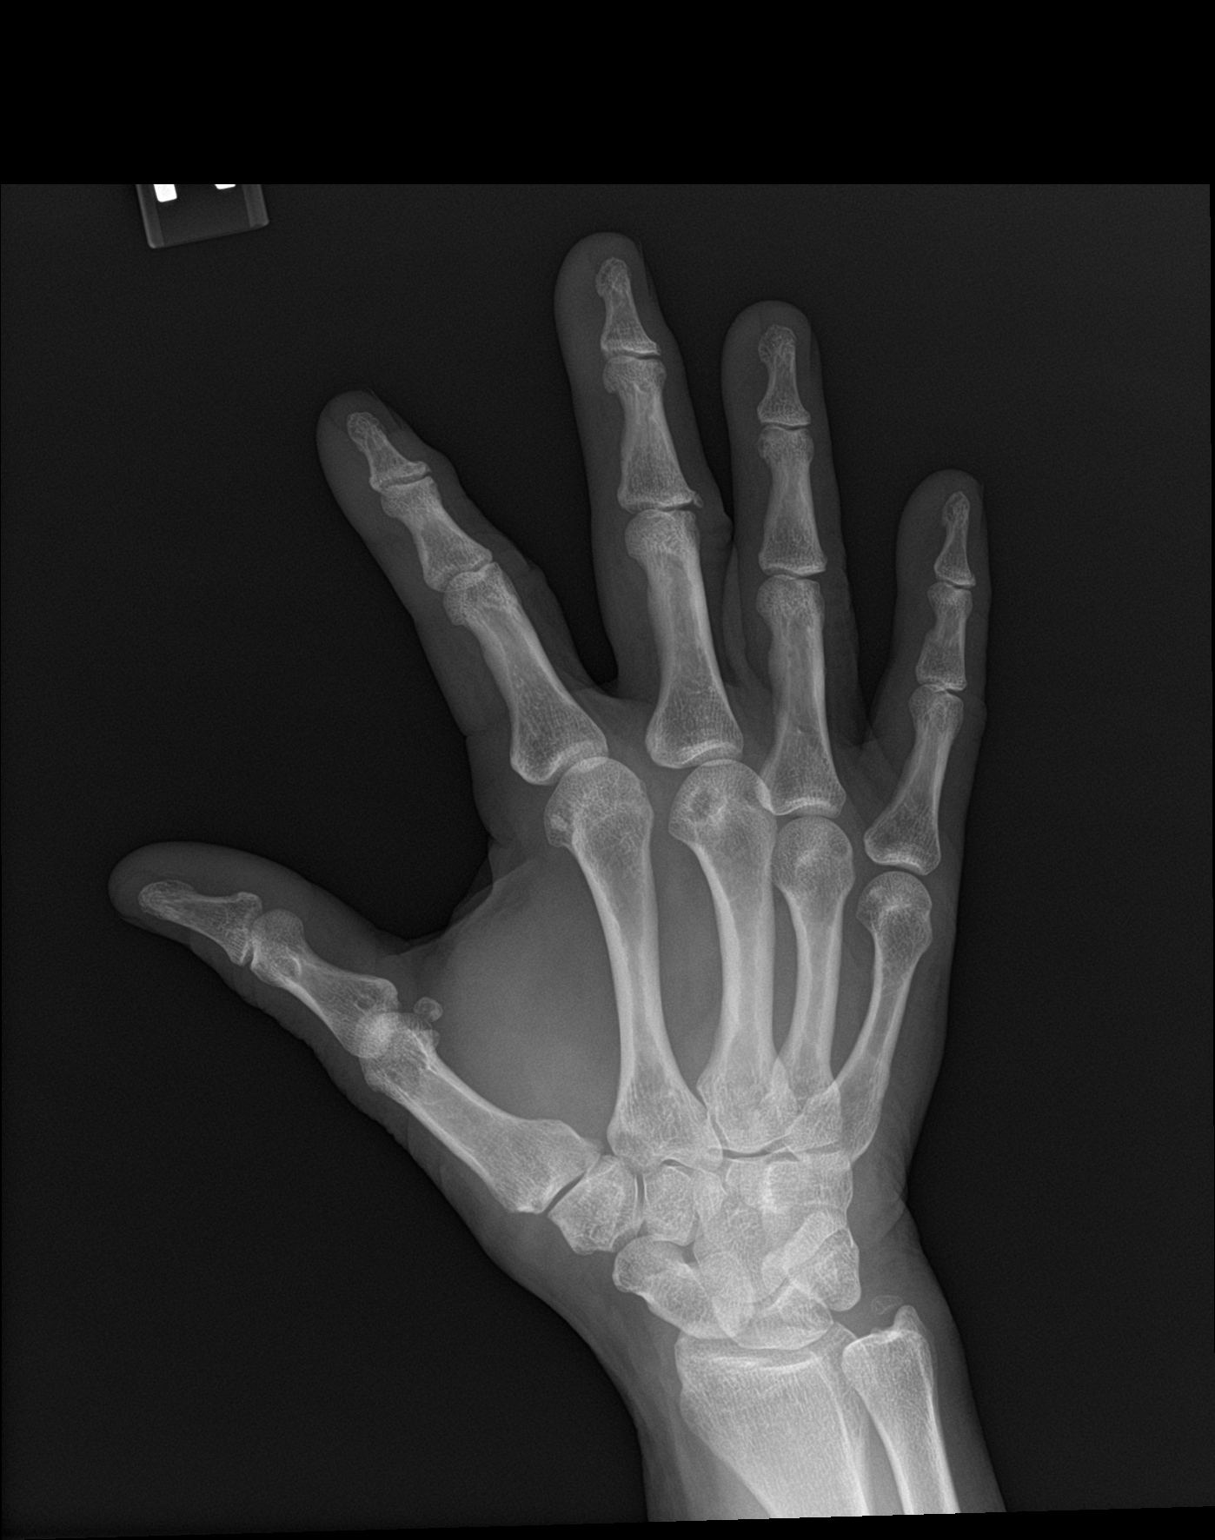

[hand lat]
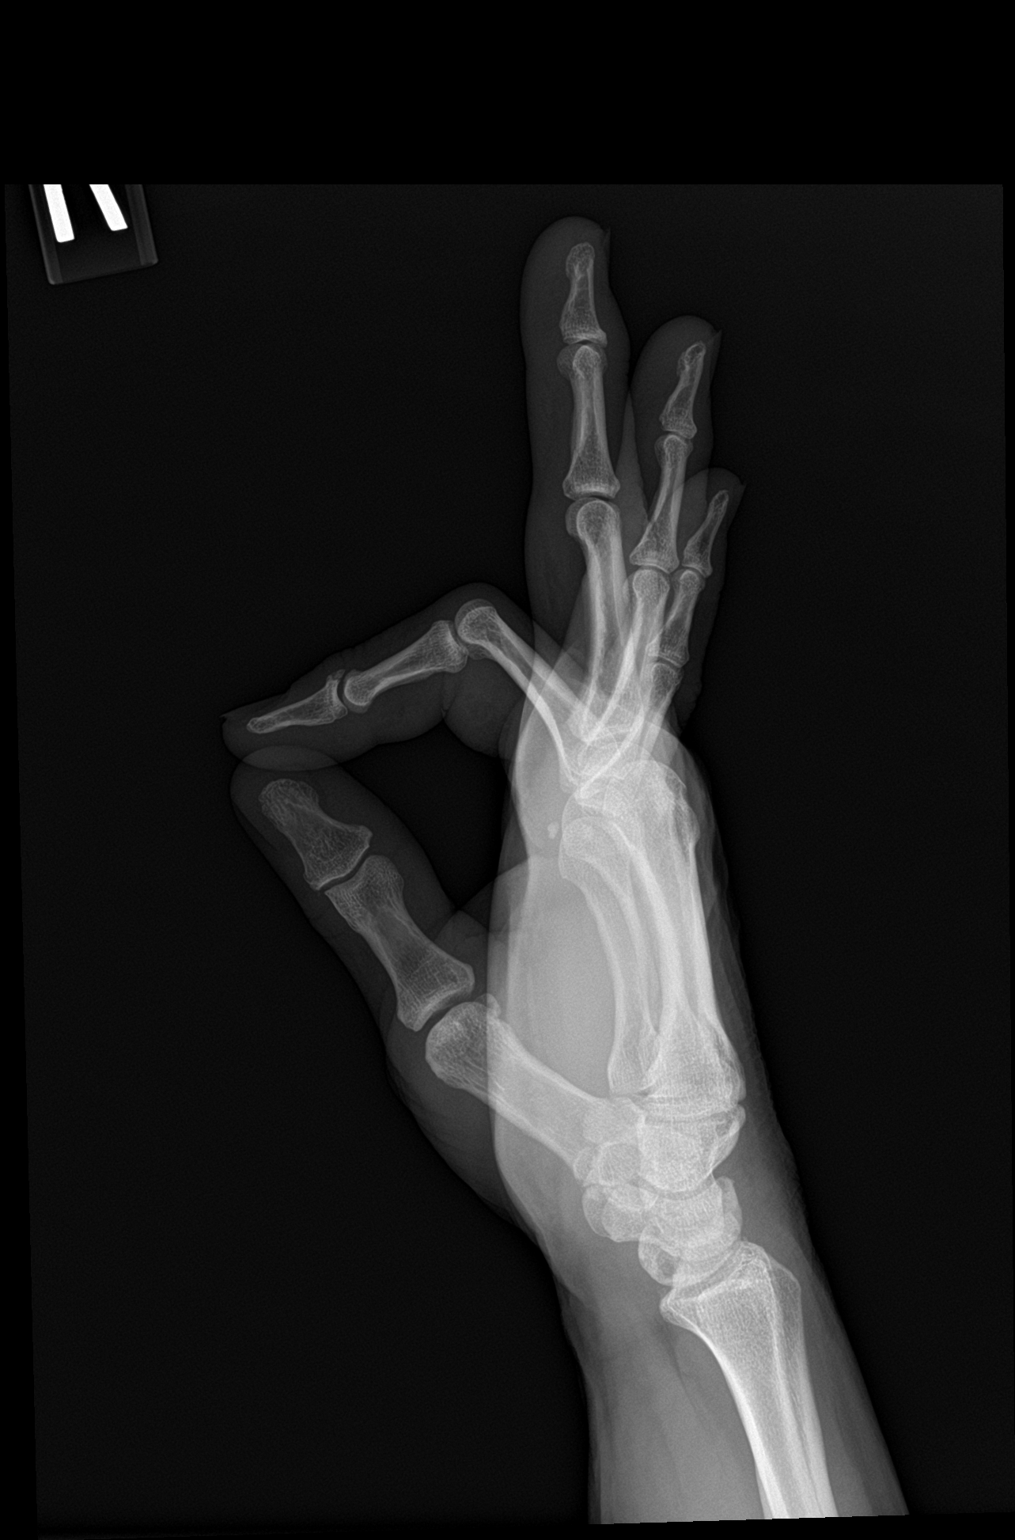

[3 of 3 positions shown; findings below may reference images not displayed]

FINDINGS: Low corticated os ossific fragment adjacent to the ulna styloid
process likely sequelae of remote trauma. No acute fracture or
dislocation. Mild spurring noted at the distal interphalangeal joint
of the second digit. Lucency at the base of the dorsal spine are of
the distal phalanx of the second digit likely sequelae of remote
trauma.
IMPRESSION: No acute abnormality of the right hand.

## 2021-12-08 ENCOUNTER — Telehealth: Payer: Self-pay | Admitting: Family Medicine

## 2021-12-08 NOTE — Telephone Encounter (Signed)
Recommend appt

## 2021-12-08 NOTE — Telephone Encounter (Signed)
Patient requesting vaginal cream she wasn't sure if she needs to make an appointment.

## 2021-12-09 ENCOUNTER — Other Ambulatory Visit: Payer: Self-pay | Admitting: Family Medicine

## 2021-12-09 DIAGNOSIS — G8929 Other chronic pain: Secondary | ICD-10-CM

## 2021-12-09 DIAGNOSIS — M1990 Unspecified osteoarthritis, unspecified site: Secondary | ICD-10-CM

## 2021-12-09 DIAGNOSIS — K219 Gastro-esophageal reflux disease without esophagitis: Secondary | ICD-10-CM

## 2021-12-16 ENCOUNTER — Ambulatory Visit (INDEPENDENT_AMBULATORY_CARE_PROVIDER_SITE_OTHER): Payer: Medicare PPO

## 2021-12-16 ENCOUNTER — Ambulatory Visit (INDEPENDENT_AMBULATORY_CARE_PROVIDER_SITE_OTHER): Payer: Medicare PPO | Admitting: Sports Medicine

## 2021-12-16 DIAGNOSIS — M545 Low back pain, unspecified: Secondary | ICD-10-CM

## 2021-12-16 DIAGNOSIS — M1811 Unilateral primary osteoarthritis of first carpometacarpal joint, right hand: Secondary | ICD-10-CM

## 2021-12-16 DIAGNOSIS — M4316 Spondylolisthesis, lumbar region: Secondary | ICD-10-CM | POA: Diagnosis not present

## 2021-12-16 DIAGNOSIS — M25551 Pain in right hip: Secondary | ICD-10-CM | POA: Diagnosis not present

## 2021-12-16 DIAGNOSIS — M79641 Pain in right hand: Secondary | ICD-10-CM | POA: Diagnosis not present

## 2021-12-16 DIAGNOSIS — M2550 Pain in unspecified joint: Secondary | ICD-10-CM | POA: Diagnosis not present

## 2021-12-16 DIAGNOSIS — G8929 Other chronic pain: Secondary | ICD-10-CM | POA: Diagnosis not present

## 2021-12-16 DIAGNOSIS — M533 Sacrococcygeal disorders, not elsewhere classified: Secondary | ICD-10-CM | POA: Diagnosis not present

## 2021-12-16 MED ORDER — ACETAMINOPHEN ER 650 MG PO TBCR: 650.0000 mg | EXTENDED_RELEASE_TABLET | Freq: Three times a day (TID) | ORAL | 3 refills | Status: AC | PRN

## 2021-12-16 NOTE — Assessment & Plan Note (Signed)
Also has chronic axial low back pain, right-sided, worse when going from sitting to standing. Hip joint and bursa exams are unrevealing, pain is mostly axial. Adding home conditioning, lumbar spine x-rays, as well as medication changes as above.

## 2021-12-16 NOTE — Assessment & Plan Note (Signed)
Widespread aches and pains, likely osteoarthritic but we do need to rule out an autoimmune process, ordering a full rheumatoid work-up. We will also switch her from regular shoe and Tylenol to arthritis strength, continue Celebrex twice daily. Stay as active as possible.

## 2021-12-16 NOTE — Progress Notes (Signed)
    Procedures performed today:    Procedure: Real-time Ultrasound Guided injection of the right first [facility] Device: Samsung HS60  Verbal informed consent obtained.  Time-out conducted.  Noted no overlying erythema, induration, or other signs of local infection.  Skin prepped in a sterile fashion.  Local anesthesia: Topical Ethyl chloride.  With sterile technique and under real time ultrasound guidance: Arthritic joint noted, 1/2 cc lidocaine, 1/2 cc kenalog 40 injected easily. Completed without difficulty  Advised to call if fevers/chills, erythema, induration, drainage, or persistent bleeding.  Images permanently stored and available for review in PACS.  Impression: Technically successful ultrasound guided injection.  Independent interpretation of notes and tests performed by another provider:   None.  Brief History, Exam, Impression, and Recommendations:    Primary osteoarthritis of first carpometacarpal joint of one hand, right status post left CMC arthroplasty History of left CMC arthroplasty, increasing pain right first [facility] requesting injection today. Injected, adding home conditioning, x-rays. Return to see me in 6 weeks for this.  Polyarthralgia Widespread aches and pains, likely osteoarthritic but we do need to rule out an autoimmune process, ordering a full rheumatoid work-up. We will also switch Hanson from regular shoe and Tylenol to arthritis strength, continue Celebrex twice daily. Stay as active as possible.  Chronic low back pain Also has chronic axial low back pain, right-sided, worse when going from sitting to standing. Hip joint and bursa exams are unrevealing, pain is mostly axial. Adding home conditioning, lumbar spine x-rays, as well as medication changes as above.    ____________________________________________ Amber Hanson. Dianah Field, M.D., ABFM., CAQSM., AME. Primary Care and Sports Medicine [facility]  Adjunct Professor of  [employer] [facility] [employer]  Risk manager

## 2021-12-16 NOTE — Assessment & Plan Note (Signed)
History of left CMC arthroplasty, increasing pain right first [facility] requesting injection today. Injected, adding home conditioning, x-rays. Return to see me in 6 weeks for this.

## 2021-12-18 ENCOUNTER — Other Ambulatory Visit: Payer: Self-pay | Admitting: Sports Medicine

## 2021-12-18 DIAGNOSIS — M545 Low back pain, unspecified: Secondary | ICD-10-CM

## 2021-12-18 DIAGNOSIS — G8929 Other chronic pain: Secondary | ICD-10-CM

## 2021-12-18 MED ORDER — PREDNISONE 50 MG PO TABS: ORAL_TABLET | ORAL | 0 refills | Status: DC

## 2021-12-22 ENCOUNTER — Encounter: Payer: Self-pay | Admitting: Family Medicine

## 2021-12-22 ENCOUNTER — Ambulatory Visit (INDEPENDENT_AMBULATORY_CARE_PROVIDER_SITE_OTHER): Payer: Medicare PPO | Admitting: Family Medicine

## 2021-12-22 VITALS — BP 129/80 | HR 78 | Ht 64.0 in | Wt 155.0 lb

## 2021-12-22 DIAGNOSIS — Z23 Encounter for immunization: Secondary | ICD-10-CM | POA: Diagnosis not present

## 2021-12-22 DIAGNOSIS — N951 Menopausal and female climacteric states: Secondary | ICD-10-CM | POA: Diagnosis not present

## 2021-12-22 LAB — LUPUS(12) PANEL
Anti Nuclear Antibody (ANA): NEGATIVE
C3 Complement: 134 mg/dL (ref 83–193)
C4 Complement: 21 mg/dL (ref 15–57)
ENA SM Ab Ser-aCnc: <1 AI
Rheumatoid fact SerPl-aCnc: <14 IU/mL (ref <14)
Ribosomal P Protein Ab: <1 AI
SM/RNP: <1 AI
SSA (Ro) (ENA) Antibody, IgG: <1 AI
SSB (La) (ENA) Antibody, IgG: <1 AI
Scleroderma (Scl-70) (ENA) Antibody, IgG: <1 AI
Thyroperoxidase Ab SerPl-aCnc: <1 IU/mL (ref <9)
ds DNA Ab: 1 IU/mL

## 2021-12-22 LAB — CBC WITH DIFFERENTIAL/PLATELET
Absolute Monocytes: 398 cells/uL (ref 200–950)
Basophils Absolute: 67 cells/uL (ref 0–200)
Basophils Relative: 1.2 %
Eosinophils Absolute: 218 cells/uL (ref 15–500)
Eosinophils Relative: 3.9 %
HCT: 39.5 % (ref 35.0–45.0)
Hemoglobin: 13.4 g/dL (ref 11.7–15.5)
Lymphs Abs: 1288 cells/uL (ref 850–3900)
MCH: 32.3 pg (ref 27.0–33.0)
MCHC: 33.9 g/dL (ref 32.0–36.0)
MCV: 95.2 fL (ref 80.0–100.0)
MPV: 12 fL (ref 7.5–12.5)
Monocytes Relative: 7.1 %
Neutro Abs: 3629 cells/uL (ref 1500–7800)
Neutrophils Relative %: 64.8 %
Platelets: 115 10*3/uL — ABNORMAL LOW (ref 140–400)
RBC: 4.15 10*6/uL (ref 3.80–5.10)
RDW: 11.8 % (ref 11.0–15.0)
Total Lymphocyte: 23 %
WBC: 5.6 10*3/uL (ref 3.8–10.8)

## 2021-12-22 LAB — COMPREHENSIVE METABOLIC PANEL
AG Ratio: 1.8 (calc) (ref 1.0–2.5)
ALT: 13 U/L (ref 6–29)
AST: 17 U/L (ref 10–35)
Albumin: 4.2 g/dL (ref 3.6–5.1)
Alkaline phosphatase (APISO): 65 U/L (ref 37–153)
BUN: 13 mg/dL (ref 7–25)
CO2: 28 mmol/L (ref 20–32)
Calcium: 9.5 mg/dL (ref 8.6–10.4)
Chloride: 101 mmol/L (ref 98–110)
Creat: 0.8 mg/dL (ref 0.50–1.05)
Globulin: 2.4 g/dL (calc) (ref 1.9–3.7)
Glucose, Bld: 82 mg/dL (ref 65–99)
Potassium: 4.1 mmol/L (ref 3.5–5.3)
Sodium: 139 mmol/L (ref 135–146)
Total Bilirubin: 0.3 mg/dL (ref 0.2–1.2)
Total Protein: 6.6 g/dL (ref 6.1–8.1)

## 2021-12-22 LAB — URIC ACID: Uric Acid, Serum: 4 mg/dL (ref 2.5–7.0)

## 2021-12-22 LAB — RHEUMATOID FACTOR (IGA, IGG, IGM)
Rheumatoid Factor (IgA): <5 U (ref <=6)
Rheumatoid Factor (IgG): <5 U (ref <=6)
Rheumatoid Factor (IgM): <5 U (ref <=6)

## 2021-12-22 LAB — CK: Total CK: 54 U/L (ref 29–143)

## 2021-12-22 LAB — CYCLIC CITRUL PEPTIDE ANTIBODY, IGG: Cyclic Citrullin Peptide Ab: <16 UNITS

## 2021-12-22 LAB — SEDIMENTATION RATE: Sed Rate: 6 mm/h (ref 0–30)

## 2021-12-22 MED ORDER — PREMARIN 0.625 MG/GM VA CREA: 1.0000 | TOPICAL_CREAM | Freq: Every day | VAGINAL | 6 refills | Status: DC

## 2021-12-22 NOTE — Progress Notes (Signed)
Amber Hanson - 61 y.o. female MRN [id number]  Date of birth: 1960-04-13  Subjective Chief Complaint  Patient presents with   Medication Refill    HPI Amber Hanson is a 61 y.o. female here today to discuss menopause symptoms.  Continues on estradiol 1mg  daily.  She was also previously on topical estrogen.  She felt that premarin worked better for her vaginal atrophy and would like to restart this.  She did try estrace at one point but didn't feel that this was as effective for her.  She denies pain with urination.  No history of VTE.  She is a non-smoker and has had a hysterectomy.   ROS:  A comprehensive ROS was completed and negative except as noted per HPI  Allergies  Allergen Reactions   Other Itching, Nausea And Vomiting and Other (See Comments)    Patient states she has taken this safely before since the one episode of itching  Anti-inflammatory - affects liver function Patient states she has taken this safely before since the one episode of itching  Anti-inflammatory - affects liver function  Patient states she has taken this safely before since the one episode of itching  Anti-inflammatory - affects liver function   Red Dye Itching    Patient states she has taken this safely before since the one episode of itching     Sulfa Antibiotics Anaphylaxis   Sulfasalazine Anaphylaxis and Swelling   Tramadol Nausea And Vomiting and Other (See Comments)    Headaches     Ibuprofen Other (See Comments)    Affects liver functions    Past Medical History:  Diagnosis Date   Anxiety 03/08/2017   Arthritis 03/08/2017   Bipolar 1 disorder (HCC) 08/20/2015   Overview:  Dr. 08/22/2015, [facility]. Now Dr Chilton Si   Last Assessment & Plan:  Relevant Hx: Course: Daily Update: Today's Plan:   Chronic low back pain 07/05/2011   Overview:  Dr. 07/07/2011. Spangler, Ortho   Gastroesophageal reflux disease without esophagitis 08/20/2015   Insomnia 03/05/2013   Migraine without status migrainosus, not  intractable 09/19/2012   Postmenopausal HRT (hormone replacement therapy) 08/20/2015    Past Surgical History:  Procedure Laterality Date   CESAREAN SECTION     (2)    CHOLECYSTECTOMY     COLONOSCOPY  2007   in [place]- normal exam   KNEE SURGERY Bilateral    VAGINAL HYSTERECTOMY      Social History   Socioeconomic History   Marital status: Widowed    Spouse name: Not on file   Number of children: 2   Years of education: 14   Highest education level: Associate degree: academic program  Occupational History   Occupation: [employer]    Comment: Working part time   Occupation: disabled  Tobacco Use   Smoking status: Never   Smokeless tobacco: Never  Vaping Use   Vaping Use: Never used  Substance and Sexual Activity   Alcohol use: No   Drug use: No   Sexual activity: Not Currently    Partners: Male    Birth control/protection: None  Other Topics Concern   Not on file  Social History Narrative   Lives with her dad but will be moving soon. She enjoys gardening and painting.   Social Determinants of Health   Financial Resource Strain: Low Risk  (10/09/2021)   Overall Financial Resource Strain (CARDIA)    Difficulty of Paying Living Expenses: Not hard at all  Food Insecurity: No Food Insecurity (10/09/2021)  Hunger Vital Sign    Worried About Running Out of Food in the Last Year: Never true    Ran Out of Food in the Last Year: Never true  Transportation Needs: No Transportation Needs (10/09/2021)   PRAPARE - Administrator, Civil Service (Medical): No    Lack of Transportation (Non-Medical): No  Physical Activity: Sufficiently Active (10/09/2021)   Exercise Vital Sign    Days of Exercise per Week: 5 days    Minutes of Exercise per Session: 60 min  Stress: No Stress Concern Present (10/09/2021)   [employer] of Occupational Health - Occupational Stress Questionnaire    Feeling of Stress : Not at all  Social Connections: Socially Isolated (10/09/2021)    Social Connection and Isolation Panel [NHANES]    Frequency of Communication with Friends and Family: More than three times a week    Frequency of Social Gatherings with Friends and Family: More than three times a week    Attends Religious Services: Never    Database administrator or Organizations: No    Attends Banker Meetings: Never    Marital Status: Widowed    Family History  Problem Relation Age of Onset   Cancer Mother        BONE   Depression Father    Diabetes Father    Depression Sister    Lung cancer Sister    Uterine cancer Sister    Depression Brother    Stomach cancer Maternal Grandmother    Stroke Other    GER disease Other    Colon cancer Neg Hx     Health Maintenance  Topic Date Due   Lung Cancer Screening  Never done   Zoster Vaccines- Shingrix (2 of 2) 01/09/2022 (Originally 12/18/2020)   COVID-19 Vaccine (3 - Moderna series) 03/25/2022 (Originally 11/09/2019)   TETANUS/TDAP  10/10/2022 (Originally 07/04/2021)   Hepatitis C Screening  10/10/2022 (Originally 10/26/1977)   HIV Screening  10/10/2022 (Originally 10/27/1974)   MAMMOGRAM  06/20/2022   Medicare Annual Wellness (AWV)  10/10/2022   COLONOSCOPY (Pts 45-75yrs Insurance coverage will need to be confirmed)  12/26/2030   INFLUENZA VACCINE  Completed   HPV VACCINES  Aged Out     ----------------------------------------------------------------------------------------------------------------------------------------------------------------------------------------------------------------- Physical Exam BP 129/80 (BP Location: Left Arm, Patient Position: Sitting, Cuff Size: Normal)   Pulse 78   Ht 5\' 4"  (1.626 m)   Wt 155 lb (70.3 kg)   LMP  (LMP Unknown)   SpO2 100%   BMI 26.61 kg/m   Physical Exam Constitutional:      Appearance: Normal appearance.  Eyes:     General: No scleral icterus. Cardiovascular:     Rate and Rhythm: Normal rate and regular rhythm.  Pulmonary:     Effort:  Pulmonary effort is normal.     Breath sounds: Normal breath sounds.  Musculoskeletal:     Cervical back: Neck supple.  Neurological:     Mental Status: She is alert.  Psychiatric:        Mood and Affect: Mood normal.        Behavior: Behavior normal.     ------------------------------------------------------------------------------------------------------------------------------------------------------------------------------------------------------------------- Assessment and Plan  Menopausal symptoms Continue estrace tabs daily and adding premarin back on.  F/u 6 months for annual exam.    Meds ordered this encounter  Medications   conjugated estrogens (PREMARIN) vaginal cream    Sig: Place 1 Applicatorful vaginally daily. Use daily x2 weeks then reduce to three times per week.  Dispense:  42.5 g    Refill:  6    No follow-ups on file.    This visit occurred during the SARS-CoV-2 public health emergency.  Safety protocols were in place, including screening questions prior to the visit, additional usage of staff PPE, and extensive cleaning of exam room while observing appropriate contact time as indicated for disinfecting solutions.

## 2021-12-22 NOTE — Assessment & Plan Note (Signed)
Continue estrace tabs daily and adding premarin back on.  F/u 6 months for annual exam.

## 2022-01-25 ENCOUNTER — Telehealth (HOSPITAL_COMMUNITY): Payer: Self-pay | Admitting: *Deleted

## 2022-01-25 MED ORDER — QUETIAPINE FUMARATE 100 MG PO TABS: ORAL_TABLET | ORAL | 1 refills | Status: DC

## 2022-01-25 NOTE — Addendum Note (Signed)
Addended by: Thresa Ross on: 01/25/2022 01:19 PM   Modules accepted: Orders

## 2022-01-25 NOTE — Telephone Encounter (Signed)
RX REFILL REQUEST :: [id number] - [place], [place] - [address]   QUEtiapine (SEROQUEL) 100 MG tablet   LAST SEN: 11/05/21 NEXT APPT: 1/15//24

## 2022-03-01 ENCOUNTER — Encounter: Payer: Self-pay | Admitting: Family Medicine

## 2022-03-01 ENCOUNTER — Ambulatory Visit (INDEPENDENT_AMBULATORY_CARE_PROVIDER_SITE_OTHER): Payer: Medicare PPO | Admitting: Family Medicine

## 2022-03-01 VITALS — BP 121/75 | HR 105 | Ht 64.0 in | Wt 155.0 lb

## 2022-03-01 DIAGNOSIS — K921 Melena: Secondary | ICD-10-CM

## 2022-03-01 DIAGNOSIS — R109 Unspecified abdominal pain: Secondary | ICD-10-CM

## 2022-03-01 DIAGNOSIS — R195 Other fecal abnormalities: Secondary | ICD-10-CM | POA: Diagnosis not present

## 2022-03-01 LAB — CBC WITH DIFFERENTIAL/PLATELET
Absolute Monocytes: 496 cells/uL (ref 200–950)
Basophils Absolute: 92 cells/uL (ref 0–200)
Basophils Relative: 1.1 %
Eosinophils Absolute: 218 cells/uL (ref 15–500)
Eosinophils Relative: 2.6 %
HCT: 39.3 % (ref 35.0–45.0)
Hemoglobin: 13.5 g/dL (ref 11.7–15.5)
Lymphs Abs: 1865 cells/uL (ref 850–3900)
MCH: 31.8 pg (ref 27.0–33.0)
MCHC: 34.4 g/dL (ref 32.0–36.0)
MCV: 92.5 fL (ref 80.0–100.0)
MPV: 11 fL (ref 7.5–12.5)
Monocytes Relative: 5.9 %
Neutro Abs: 5729 cells/uL (ref 1500–7800)
Neutrophils Relative %: 68.2 %
Platelets: 222 10*3/uL (ref 140–400)
RBC: 4.25 10*6/uL (ref 3.80–5.10)
RDW: 12.2 % (ref 11.0–15.0)
Total Lymphocyte: 22.2 %
WBC: 8.4 10*3/uL (ref 3.8–10.8)

## 2022-03-01 LAB — COMPLETE METABOLIC PANEL WITH GFR
AG Ratio: 1.5 (calc) (ref 1.0–2.5)
ALT: 17 U/L (ref 6–29)
AST: 17 U/L (ref 10–35)
Albumin: 4 g/dL (ref 3.6–5.1)
Alkaline phosphatase (APISO): 72 U/L (ref 37–153)
BUN: 22 mg/dL (ref 7–25)
CO2: 33 mmol/L — ABNORMAL HIGH (ref 20–32)
Calcium: 9.3 mg/dL (ref 8.6–10.4)
Chloride: 103 mmol/L (ref 98–110)
Creat: 0.81 mg/dL (ref 0.50–1.05)
Globulin: 2.6 g/dL (calc) (ref 1.9–3.7)
Glucose, Bld: 103 mg/dL — ABNORMAL HIGH (ref 65–99)
Potassium: 4.4 mmol/L (ref 3.5–5.3)
Sodium: 142 mmol/L (ref 135–146)
Total Bilirubin: 0.3 mg/dL (ref 0.2–1.2)
Total Protein: 6.6 g/dL (ref 6.1–8.1)
eGFR: 82 mL/min/{1.73_m2} (ref >=60)

## 2022-03-01 NOTE — Assessment & Plan Note (Signed)
Denies abdominal pain as well as recent history of dark tarry stools.  Concerning for upper GI bleed.  Checking CMP and CBC stat today.  We discussed if there is significant change in her hemoglobin would recommend ED evaluation.  If this is stable we will plan for outpatient GI referral under the understanding that if symptoms worsen again she will be seen in the ED.  I will have her increase her Protonix to twice daily dosing.

## 2022-03-01 NOTE — Patient Instructions (Signed)
Increase pantoprazole to twice per day.  Stop celebrex.  We'll be in touch with lab results.

## 2022-03-01 NOTE — Progress Notes (Signed)
Amber Hanson - 62 y.o. female MRN [id number]  Date of birth: 31-Jan-1961  Subjective No chief complaint on file.   HPI Amber Hanson is a [age] year old female here today with complaint of abdominal pain and dark stools.  She reports that she first noticed this about a week ago.  Pain located in the epigastric region.  Accompanied by dark, tarry stools.  She reports that change in stool has almost resolved at this point.  This lasted about 3 to 4 days.  She denies any bright red blood, rectal pain, nausea or significant appetite change.  She does report that she does have pain with eating.  No fever or chills.  She denies dizziness, lightheadedness, increasing fatigue.  ROS:  A comprehensive ROS was completed and negative except as noted per HPI  Allergies  Allergen Reactions   Other Itching, Nausea And Vomiting and Other (See Comments)    Patient states she has taken this safely before since the one episode of itching  Anti-inflammatory - affects liver function Patient states she has taken this safely before since the one episode of itching  Anti-inflammatory - affects liver function  Patient states she has taken this safely before since the one episode of itching  Anti-inflammatory - affects liver function   Red Dye Itching    Patient states she has taken this safely before since the one episode of itching     Sulfa Antibiotics Anaphylaxis   Sulfasalazine Anaphylaxis and Swelling   Ibuprofen Other (See Comments)    Affects liver functions    Past Medical History:  Diagnosis Date   Anxiety 03/08/2017   Arthritis 03/08/2017   Bipolar 1 disorder ([place]) 08/20/2015   Overview:  Dr. Nyoka Cowden, [facility]. Now Dr Annette Stable   Last Assessment & Plan:  Relevant Hx: Course: Daily Update: Today's Plan:   Chronic low back pain 07/05/2011   Overview:  Dr. Darene Lamer. Spangler, Ortho   Gastroesophageal reflux disease without esophagitis 08/20/2015   Insomnia 03/05/2013   Migraine without status migrainosus, not  intractable 09/19/2012   Postmenopausal HRT (hormone replacement therapy) 08/20/2015    Past Surgical History:  Procedure Laterality Date   CESAREAN SECTION     (2)    CHOLECYSTECTOMY     COLONOSCOPY  2007   in [place]- normal exam   KNEE SURGERY Bilateral    VAGINAL HYSTERECTOMY      Social History   Socioeconomic History   Marital status: Widowed    Spouse name: Not on file   Number of children: 2   Years of education: 14   Highest education level: Associate degree: academic program  Occupational History   Occupation: [employer]    Comment: Working part time   Occupation: disabled  Tobacco Use   Smoking status: Never   Smokeless tobacco: Never  Vaping Use   Vaping Use: Never used  Substance and Sexual Activity   Alcohol use: No   Drug use: No   Sexual activity: Not Currently    Partners: Male    Birth control/protection: None  Other Topics Concern   Not on file  Social History Narrative   Lives with her dad but will be moving soon. She enjoys gardening and painting.   Social Determinants of Health   Financial Resource Strain: Low Risk  (10/09/2021)   Overall Financial Resource Strain (CARDIA)    Difficulty of Paying Living Expenses: Not hard at all  Food Insecurity: No Food Insecurity (10/09/2021)   Hunger Vital Sign    Worried  About Running Out of Food in the Last Year: Never true    Ran Out of Food in the Last Year: Never true  Transportation Needs: No Transportation Needs (10/09/2021)   PRAPARE - Administrator, Civil Service (Medical): No    Lack of Transportation (Non-Medical): No  Physical Activity: Sufficiently Active (10/09/2021)   Exercise Vital Sign    Days of Exercise per Week: 5 days    Minutes of Exercise per Session: 60 min  Stress: No Stress Concern Present (10/09/2021)   [employer] of Occupational Health - Occupational Stress Questionnaire    Feeling of Stress : Not at all  Social Connections: Socially Isolated (10/09/2021)    Social Connection and Isolation Panel [NHANES]    Frequency of Communication with Friends and Family: More than three times a week    Frequency of Social Gatherings with Friends and Family: More than three times a week    Attends Religious Services: Never    Database administrator or Organizations: No    Attends Banker Meetings: Never    Marital Status: Widowed    Family History  Problem Relation Age of Onset   Cancer Mother        BONE   Depression Father    Diabetes Father    Depression Sister    Lung cancer Sister    Uterine cancer Sister    Depression Brother    Stomach cancer Maternal Grandmother    Stroke Other    GER disease Other    Colon cancer Neg Hx     Health Maintenance  Topic Date Due   DTaP/Tdap/Td (3 - Td or Tdap) 07/04/2021   COVID-19 Vaccine (3 - 2023-24 season) 03/25/2022 (Originally 10/23/2021)   Zoster Vaccines- Shingrix (2 of 2) 05/31/2022 (Originally 12/18/2020)   Hepatitis C Screening  10/10/2022 (Originally 10/26/1977)   HIV Screening  10/10/2022 (Originally 10/27/1974)   Lung Cancer Screening  03/02/2023 (Originally 10/26/2009)   MAMMOGRAM  06/20/2022   Medicare Annual Wellness (AWV)  10/10/2022   COLONOSCOPY (Pts 45-80yrs Insurance coverage will need to be confirmed)  12/26/2030   INFLUENZA VACCINE  Completed   HPV VACCINES  Aged Out     ----------------------------------------------------------------------------------------------------------------------------------------------------------------------------------------------------------------- Physical Exam BP 121/75 (BP Location: Left Arm, Patient Position: Sitting, Cuff Size: Normal)   Pulse (!) 105   Ht 5\' 4"  (1.626 m)   Wt 155 lb (70.3 kg)   LMP  (LMP Unknown)   SpO2 99%   BMI 26.61 kg/m   Physical Exam Constitutional:      Appearance: Normal appearance.  HENT:     Head: Normocephalic and atraumatic.  Cardiovascular:     Rate and Rhythm: Normal rate and regular rhythm.   Pulmonary:     Effort: Pulmonary effort is normal.     Breath sounds: Normal breath sounds.  Abdominal:     General: Abdomen is flat. There is no distension.     Palpations: Abdomen is soft.     Tenderness: There is no abdominal tenderness.  Neurological:     Mental Status: She is alert.  Psychiatric:        Mood and Affect: Mood normal.        Behavior: Behavior normal.     ------------------------------------------------------------------------------------------------------------------------------------------------------------------------------------------------------------------- Assessment and Plan  Abdominal pain Denies abdominal pain as well as recent history of dark tarry stools.  Concerning for upper GI bleed.  Checking CMP and CBC stat today.  We discussed if there is significant change in  her hemoglobin would recommend ED evaluation.  If this is stable we will plan for outpatient GI referral under the understanding that if symptoms worsen again she will be seen in the ED.  I will have her increase her Protonix to twice daily dosing.   No orders of the defined types were placed in this encounter.   No follow-ups on file.    This visit occurred during the SARS-CoV-2 public health emergency.  Safety protocols were in place, including screening questions prior to the visit, additional usage of staff PPE, and extensive cleaning of exam room while observing appropriate contact time as indicated for disinfecting solutions.

## 2022-03-04 ENCOUNTER — Telehealth: Payer: Self-pay | Admitting: Gastroenterology

## 2022-03-04 ENCOUNTER — Ambulatory Visit: Payer: Medicare PPO | Admitting: Gastroenterology

## 2022-03-04 ENCOUNTER — Encounter: Payer: Self-pay | Admitting: Gastroenterology

## 2022-03-04 VITALS — BP 120/70 | HR 91 | Ht 64.0 in | Wt 157.0 lb

## 2022-03-04 DIAGNOSIS — R12 Heartburn: Secondary | ICD-10-CM

## 2022-03-04 DIAGNOSIS — K148 Other diseases of tongue: Secondary | ICD-10-CM

## 2022-03-04 DIAGNOSIS — R194 Change in bowel habit: Secondary | ICD-10-CM | POA: Diagnosis not present

## 2022-03-04 DIAGNOSIS — R1013 Epigastric pain: Secondary | ICD-10-CM

## 2022-03-04 NOTE — Telephone Encounter (Signed)
Patient is calling wondering if instead of being referred to Dr. Redmond Baseman if she could get a referral to [facility] in [place] given that it's closer to her house. Please advise

## 2022-03-04 NOTE — Progress Notes (Signed)
[facility] Consult Note:  History: Amber Hanson 03/04/2022  Referring provider: Luetta Nutting, DO  Reason for consult/chief complaint: dark stools (Stools are clay like and black. Patient states this happen over Christmas, but things seem to be better now. )   Subjective  HPI: Amber Hanson is a 62 year old woman that I know from a screening colonoscopy November 2022.  It was a normal exam, and she had reported a normal colonoscopy in 2007 as well. She was seen by primary care 3 days ago for abdominal pain and dark stool and referred for evaluation.  She was describing nearly a week of epigastric pain with dark, tarry stool that had about resolved at the time of that visit.  Once daily PPI was increased to twice daily, and it should be noted she regularly takes Celebrex for arthritis.  Amber Hanson describes a sudden onset illness around Christmas with upper abdominal pain and protracted vomiting for a day or 2.  It has gradually subsided.  Stools were black and tarry during that time but since returned to normal.  Prior to that, she has had years of daily Prilosec use for heartburn.  As long as she took her medicine symptoms were under control.  She denies dysphagia, odynophagia, loss of appetite or weight loss.  ROS:  Review of Systems  Constitutional:  Negative for appetite change and unexpected weight change.  HENT:  Negative for mouth sores and voice change.   Eyes:  Negative for pain and redness.  Respiratory:  Negative for cough and shortness of breath.   Cardiovascular:  Negative for chest pain and palpitations.  Genitourinary:  Negative for dysuria and hematuria.  Musculoskeletal:  Positive for arthralgias and back pain. Negative for myalgias.  Skin:  Negative for pallor and rash.  Neurological:  Negative for weakness and headaches.  Hematological:  Negative for adenopathy.   She has also noticed a bump on the right side of her tongue for the last couple of  months.  Past Medical History: Past Medical History:  Diagnosis Date   Anxiety 03/08/2017   Arthritis 03/08/2017   Bipolar 1 disorder ([place]) 08/20/2015   Overview:  Dr. Nyoka Cowden, [facility]. Now Dr Annette Stable   Last Assessment & Plan:  Relevant Hx: Course: Daily Update: Today's Plan:   Chronic low back pain 07/05/2011   Overview:  Dr. Darene Lamer. [facility], [facility]   Gastroesophageal reflux disease without esophagitis 08/20/2015   Insomnia 03/05/2013   Migraine without status migrainosus, not intractable 09/19/2012   Postmenopausal HRT (hormone replacement therapy) 08/20/2015     Past Surgical History: Past Surgical History:  Procedure Laterality Date   CESAREAN SECTION     (2)    CHOLECYSTECTOMY     COLONOSCOPY  2007   in [place]- normal exam   KNEE SURGERY Bilateral    VAGINAL HYSTERECTOMY       Family History: Family History  Problem Relation Age of Onset   Cancer Mother        BONE   Depression Father    Diabetes Father    Depression Sister    Lung cancer Sister    Uterine cancer Sister    Depression Brother    Stomach cancer Maternal Grandmother    Stroke Other    GER disease Other    Colon cancer Neg Hx     Social History: Social History   Socioeconomic History   Marital status: Widowed    Spouse name: Not on file   Number of children: 2  Years of education: 77   Highest education level: Associate degree: academic program  Occupational History   Occupation: [employer]    Comment: Working part time   Occupation: disabled  Tobacco Use   Smoking status: Never   Smokeless tobacco: Never  Vaping Use   Vaping Use: Never used  Substance and Sexual Activity   Alcohol use: No   Drug use: No   Sexual activity: Not Currently    Partners: Male    Birth control/protection: None  Other Topics Concern   Not on file  Social History Narrative   Lives with her dad but will be moving soon. She enjoys gardening and painting.   Social Determinants of Health   Financial  Resource Strain: Low Risk  (10/09/2021)   Overall Financial Resource Strain (CARDIA)    Difficulty of Paying Living Expenses: Not hard at all  Food Insecurity: No Food Insecurity (10/09/2021)   Hunger Vital Sign    Worried About Running Out of Food in the Last Year: Never true    Ran Out of Food in the Last Year: Never true  Transportation Needs: No Transportation Needs (10/09/2021)   PRAPARE - Hydrologist (Medical): No    Lack of Transportation (Non-Medical): No  Physical Activity: Sufficiently Active (10/09/2021)   Exercise Vital Sign    Days of Exercise per Week: 5 days    Minutes of Exercise per Session: 60 min  Stress: No Stress Concern Present (10/09/2021)   [place]    Feeling of Stress : Not at all  Social Connections: Socially Isolated (10/09/2021)   Social Connection and Isolation Panel [NHANES]    Frequency of Communication with Friends and Family: More than three times a week    Frequency of Social Gatherings with Friends and Family: More than three times a week    Attends Religious Services: Never    Marine scientist or Organizations: No    Attends Archivist Meetings: Never    Marital Status: Widowed    Allergies: Allergies  Allergen Reactions   Other Itching, Nausea And Vomiting and Other (See Comments)    Patient states she has taken this safely before since the one episode of itching  Anti-inflammatory - affects liver function Patient states she has taken this safely before since the one episode of itching  Anti-inflammatory - affects liver function  Patient states she has taken this safely before since the one episode of itching  Anti-inflammatory - affects liver function   Red Dye Itching    Patient states she has taken this safely before since the one episode of itching     Sulfa Antibiotics Anaphylaxis   Sulfasalazine Anaphylaxis and Swelling    Ibuprofen Other (See Comments)    Affects liver functions    Outpatient Meds: Current Outpatient Medications  Medication Sig Dispense Refill   acetaminophen (TYLENOL) 650 MG CR tablet Take 1 tablet (650 mg total) by mouth every 8 (eight) hours as needed for pain. 90 tablet 3   busPIRone (BUSPAR) 15 MG tablet TAKE 1 TAB BYB MOUTH TWICE DAILY 60 tablet 3   cetirizine (ZYRTEC) 10 MG tablet Take by mouth. As needed     conjugated estrogens (PREMARIN) vaginal cream Place 1 Applicatorful vaginally daily. Use daily x2 weeks then reduce to three times per week. 42.5 g 6   diclofenac Sodium (VOLTAREN) 1 % GEL Apply 2 g topically 4 (four) times daily. 100  g 3   DULoxetine (CYMBALTA) 60 MG capsule Take 2 capsules (120 mg total) by mouth daily. 60 capsule 2   erythromycin ophthalmic ointment Apply 1g (pea sized amount) to eyelids three times daily for 10 days. 30 g 0   estradiol (ESTRACE) 1 MG tablet TAKE 1 TABLET EVERY DAY 90 tablet 0   ketoconazole (NIZORAL) 2 % cream Apply 1 application topically 2 (two) times daily. To affected areas. 30 g 1   pantoprazole (PROTONIX) 20 MG tablet TAKE 1 TABLET EVERY DAY 90 tablet 0   propranolol (INDERAL) 20 MG tablet TAKE 1 TABLET TWICE DAILY 180 tablet 1   QUEtiapine (SEROQUEL) 100 MG tablet TAKE 1 TABLET BY MOUTH EVERYDAY AT BEDTIME 30 tablet 1   valACYclovir (VALTREX) 1000 MG tablet Take 1 tablet (1,000 mg total) by mouth 3 (three) times daily. 21 tablet 0   No current facility-administered medications for this visit.      ___________________________________________________________________ Objective   Exam:  BP 120/70   Pulse 91   Ht 5\' 4"  (1.626 m)   Wt 157 lb (71.2 kg)   LMP  (LMP Unknown)   SpO2 98%   BMI 26.95 kg/m  Wt Readings from Last 3 Encounters:  03/04/22 157 lb (71.2 kg)  03/01/22 155 lb (70.3 kg)  12/22/21 155 lb (70.3 kg)    General: Well-appearing, normal vocal quality. Eyes: sclera anicteric, no redness ENT: Raised white  lesion right lateral aspect of the tongue, less than 5 mm in size.  No other tongue or oral lesions seen. CV: Regular without appreciable murmur., no JVD, no peripheral edema Resp: clear to auscultation bilaterally, normal RR and effort noted GI: soft, mild epigastric tenderness, with active bowel sounds. No guarding or palpable organomegaly noted.  No bruit Skin; warm and dry, no rash or jaundice noted Neuro: awake, alert and oriented x 3. Normal gross motor function and fluent speech  Labs:     Latest Ref Rng & Units 03/01/2022   11:49 AM 12/16/2021   12:00 AM  CBC  WBC 3.8 - 10.8 Thousand/uL 8.4  5.6   Hemoglobin 11.7 - 15.5 g/dL 12/18/2021  79.0   Hematocrit 35.0 - 45.0 % 39.3  39.5   Platelets 140 - 400 Thousand/uL 222  115       Latest Ref Rng & Units 03/01/2022   11:49 AM 12/16/2021   12:00 AM 07/14/2017    9:00 AM  CMP  Glucose 65 - 99 mg/dL 07/16/2017  82  71   BUN 7 - 25 mg/dL 22  13  16    Creatinine 0.50 - 1.05 mg/dL 973   5.32   Sodium 135 - 146 mmol/L 142  139  140   Potassium 3.5 - 5.3 mmol/L 4.4  4.1  3.9   Chloride 98 - 110 mmol/L 103  101  104   CO2 20 - 32 mmol/L 33  28  31   Calcium 8.6 - 10.4 mg/dL 9.3  9.5  9.2   Total Protein 6.1 - 8.1 g/dL 6.6  6.6  6.0   Total Bilirubin 0.2 - 1.2 mg/dL 0.3  0.3  0.4   AST 10 - 35 U/L 17  17  17    ALT 6 - 29 U/L 17  13  19       Assessment: Encounter Diagnoses  Name Primary?   Epigastric pain Yes   Heartburn    Altered bowel habits    Tongue lesion     It sounds like  she had an acute infectious GI illness that has largely resolved.  Some residual dyspepsia and bloating which is common after infectious illnesses.  She has pre-existing heartburn requiring once daily PPI for years, so is some risk for Barrett's esophagus and should be screened.  It seems doubtful that her black stool was bleeding.  Nevertheless, she has been on Celebrex until it was recently stopped and she has some persistent dyspepsia.  Plan:  Upper  endoscopy.  She was agreeable after discussion of procedure and risks.  The benefits and risks of the planned procedure were described in detail with the patient or (when appropriate) their health care proxy.  Risks were outlined as including, but not limited to, bleeding, infection, perforation, adverse medication reaction leading to cardiac or pulmonary decompensation, pancreatitis (if ERCP).  The limitation of incomplete mucosal visualization was also discussed.  No guarantees or warranties were given.  I have referred her to Dr. Jenne Pane at [facility] for evaluation of a tongue lesion to see if it needs biopsy or removal if there is any concern for neoplasia.  Thank you for the courtesy of this consult.  Please call me with any questions or concerns.  Charlie Pitter III  CC: Referring provider noted above

## 2022-03-04 NOTE — Telephone Encounter (Signed)
Dr Loletha Carrow is this ok to send the referral to [facility]?

## 2022-03-04 NOTE — Telephone Encounter (Signed)
Yes, of course.  - HD

## 2022-03-04 NOTE — Patient Instructions (Addendum)
_______________________________________________________  If you are age 63 or older, your body mass index should be between 23-30. Your Body mass index is 26.95 kg/m. If this is out of the aforementioned range listed, please consider follow up with your Primary Care Provider.  If you are age 32 or younger, your body mass index should be between 19-25. Your Body mass index is 26.95 kg/m. If this is out of the aformentioned range listed, please consider follow up with your Primary Care Provider.   ________________________________________________________  The [facility] providers would like to encourage you to use [facility] to communicate with providers for non-urgent requests or questions.  Due to long hold times on the telephone, sending your provider a message by [facility] may be a faster and more efficient way to get a response.  Please allow 48 business hours for a response.  Please remember that this is for non-urgent requests.  _______________________________________________________  Amber Hanson have been scheduled for an endoscopy. Please follow written instructions given to you at your visit today. If you use inhalers (even only as needed), please bring them with you on the day of your procedure.  We will send your records to Dr Melida Quitter, please allow 2 weeks for them to reach out.   [phone]  [address]  [place], [place] [address]   It was a pleasure to see you today!  Thank you for trusting me with your gastrointestinal care!

## 2022-03-05 ENCOUNTER — Other Ambulatory Visit: Payer: Self-pay

## 2022-03-05 DIAGNOSIS — R12 Heartburn: Secondary | ICD-10-CM

## 2022-03-05 DIAGNOSIS — R1013 Epigastric pain: Secondary | ICD-10-CM

## 2022-03-05 NOTE — Telephone Encounter (Signed)
Referral has been sent to [facility]. Will await appointment info.

## 2022-03-08 ENCOUNTER — Telehealth (INDEPENDENT_AMBULATORY_CARE_PROVIDER_SITE_OTHER): Payer: Medicare PPO | Admitting: Psychiatry

## 2022-03-08 ENCOUNTER — Encounter (HOSPITAL_COMMUNITY): Payer: Self-pay | Admitting: Psychiatry

## 2022-03-08 DIAGNOSIS — F411 Generalized anxiety disorder: Secondary | ICD-10-CM

## 2022-03-08 DIAGNOSIS — F319 Bipolar disorder, unspecified: Secondary | ICD-10-CM

## 2022-03-08 DIAGNOSIS — F419 Anxiety disorder, unspecified: Secondary | ICD-10-CM

## 2022-03-08 DIAGNOSIS — F5102 Adjustment insomnia: Secondary | ICD-10-CM

## 2022-03-08 MED ORDER — DULOXETINE HCL 60 MG PO CPEP: 120.0000 mg | ORAL_CAPSULE | Freq: Every day | ORAL | 2 refills | Status: DC

## 2022-03-08 MED ORDER — QUETIAPINE FUMARATE 100 MG PO TABS: ORAL_TABLET | ORAL | 1 refills | Status: DC

## 2022-03-08 NOTE — Progress Notes (Signed)
[facility] follow up visit  Patient Identification: Amber Hanson MRN:  [id number] Date of Evaluation:  03/08/2022 Referral Source: primary care Chief Complaint:   No chief complaint on file.  Visit Diagnosis:    ICD-10-CM   1. Bipolar 1 disorder (HCC)  F31.9 DULoxetine (CYMBALTA) 60 MG capsule    2. Anxiety  F41.9 DULoxetine (CYMBALTA) 60 MG capsule    3. GAD (generalized anxiety disorder)  F41.1     4. Adjustment insomnia  F51.02       Virtual Visit via Video Note  I connected with Mertha Baars on 03/08/22 at  3:00 PM EST by a video enabled telemedicine application and verified that I am speaking with the correct person using two identifiers.  Location: Patient: home Provider: home office   I discussed the limitations of evaluation and management by telemedicine and the availability of in person appointments. The patient expressed understanding and agreed to proceed.     I discussed the assessment and treatment plan with the patient. The patient was provided an opportunity to ask questions and all were answered. The patient agreed with the plan and demonstrated an understanding of the instructions.   The patient was advised to call back or seek an in-person evaluation if the symptoms worsen or if the condition fails to improve as anticipated.  I provided 15 minutes of non-face-to-face time during this encounter.      History of Present Illness: Patient is a 62 years old currently widowed Caucasian femaleinitially  referred by primary care to establish care for bipolar disorder and anxiety.  She lives by herself has 2 grown kids  History of hospital admission with OD at [facility]  Being a widow has been challenging Doing fair , a man lives with her . Last one was difficult he had to go to prison   She understands to take legal action if he threatens to come back  Doing fair  Seroquel helps sleep but still wakes up at times    Does not endorse psychotic symptoms  Denies  current use of drugs or alcohol although chart suggest history of alcohol intoxication in the past  Aggravating factor : widow  Modifying factors some friends, dogs    Severity ; somewhat subdued Duration more so related for last 2 years after being a widow   Past Psychiatric History: Bipolar, anxiety, grief  Previous Psychotropic Medications: Yes   Substance Abuse History in the last 12 months:  No.  Consequences of Substance Abuse: NA  Past Medical History:  Past Medical History:  Diagnosis Date   Anxiety 03/08/2017   Arthritis 03/08/2017   Bipolar 1 disorder ([place]) 08/20/2015   Overview:  Dr. Nyoka Cowden, [facility]. Now Dr Annette Stable   Last Assessment & Plan:  Relevant Hx: Course: Daily Update: Today's Plan:   Chronic low back pain 07/05/2011   Overview:  Dr. Darene Lamer. [facility], [facility]   Gastroesophageal reflux disease without esophagitis 08/20/2015   Insomnia 03/05/2013   Migraine without status migrainosus, not intractable 09/19/2012   Postmenopausal HRT (hormone replacement therapy) 08/20/2015    Past Surgical History:  Procedure Laterality Date   CESAREAN SECTION     (2)    CHOLECYSTECTOMY     COLONOSCOPY  2007   in [place]- normal exam   KNEE SURGERY Bilateral    VAGINAL HYSTERECTOMY      Family Psychiatric History: Dad: depression  Family History:  Family History  Problem Relation Age of Onset   Cancer Mother  BONE   Depression Father    Diabetes Father    Depression Sister    Lung cancer Sister    Uterine cancer Sister    Depression Brother    Stomach cancer Maternal Grandmother    Stroke Other    GER disease Other    Colon cancer Neg Hx     Social History:   Social History   Socioeconomic History   Marital status: Widowed    Spouse name: Not on file   Number of children: 2   Years of education: 14   Highest education level: Associate degree: academic program  Occupational History   Occupation: [employer]    Comment: Working part time    Occupation: disabled  Tobacco Use   Smoking status: Never   Smokeless tobacco: Never  Vaping Use   Vaping Use: Never used  Substance and Sexual Activity   Alcohol use: No   Drug use: No   Sexual activity: Not Currently    Partners: Male    Birth control/protection: None  Other Topics Concern   Not on file  Social History Narrative   Lives with her dad but will be moving soon. She enjoys gardening and painting.   Social Determinants of Health   Financial Resource Strain: Low Risk  (10/09/2021)   Overall Financial Resource Strain (CARDIA)    Difficulty of Paying Living Expenses: Not hard at all  Food Insecurity: No Food Insecurity (10/09/2021)   Hunger Vital Sign    Worried About Running Out of Food in the Last Year: Never true    Ran Out of Food in the Last Year: Never true  Transportation Needs: No Transportation Needs (10/09/2021)   PRAPARE - Administrator, Civil Service (Medical): No    Lack of Transportation (Non-Medical): No  Physical Activity: Sufficiently Active (10/09/2021)   Exercise Vital Sign    Days of Exercise per Week: 5 days    Minutes of Exercise per Session: 60 min  Stress: No Stress Concern Present (10/09/2021)   [employer] of Occupational Health - Occupational Stress Questionnaire    Feeling of Stress : Not at all  Social Connections: Socially Isolated (10/09/2021)   Social Connection and Isolation Panel [NHANES]    Frequency of Communication with Friends and Family: More than three times a week    Frequency of Social Gatherings with Friends and Family: More than three times a week    Attends Religious Services: Never    Database administrator or Organizations: No    Attends Banker Meetings: Never    Marital Status: Widowed    Additional Social History: grew up with mom, parents were divorced, felt she was neglected and endorses bad memories from childhood  Allergies:   Allergies  Allergen Reactions   Other Itching,  Nausea And Vomiting and Other (See Comments)    Patient states she has taken this safely before since the one episode of itching  Anti-inflammatory - affects liver function Patient states she has taken this safely before since the one episode of itching  Anti-inflammatory - affects liver function  Patient states she has taken this safely before since the one episode of itching  Anti-inflammatory - affects liver function   Red Dye Itching    Patient states she has taken this safely before since the one episode of itching     Sulfa Antibiotics Anaphylaxis   Sulfasalazine Anaphylaxis and Swelling   Ibuprofen Other (See Comments)    Affects liver  functions    Metabolic Disorder Labs: No results found for: "HGBA1C", "MPG" No results found for: "PROLACTIN" No results found for: "CHOL", "TRIG", "HDL", "CHOLHDL", "VLDL", "LDLCALC" No results found for: "TSH"  Therapeutic Level Labs: No results found for: "LITHIUM" No results found for: "CBMZ" No results found for: "VALPROATE"  Current Medications: Current Outpatient Medications  Medication Sig Dispense Refill   acetaminophen (TYLENOL) 650 MG CR tablet Take 1 tablet (650 mg total) by mouth every 8 (eight) hours as needed for pain. 90 tablet 3   busPIRone (BUSPAR) 15 MG tablet TAKE 1 TAB BYB MOUTH TWICE DAILY 60 tablet 3   cetirizine (ZYRTEC) 10 MG tablet Take by mouth. As needed     conjugated estrogens (PREMARIN) vaginal cream Place 1 Applicatorful vaginally daily. Use daily x2 weeks then reduce to three times per week. 42.5 g 6   diclofenac Sodium (VOLTAREN) 1 % GEL Apply 2 g topically 4 (four) times daily. 100 g 3   DULoxetine (CYMBALTA) 60 MG capsule Take 2 capsules (120 mg total) by mouth daily. 60 capsule 2   erythromycin ophthalmic ointment Apply 1g (pea sized amount) to eyelids three times daily for 10 days. 30 g 0   estradiol (ESTRACE) 1 MG tablet TAKE 1 TABLET EVERY DAY 90 tablet 0   ketoconazole (NIZORAL) 2 % cream Apply 1  application topically 2 (two) times daily. To affected areas. 30 g 1   pantoprazole (PROTONIX) 20 MG tablet TAKE 1 TABLET EVERY DAY 90 tablet 0   propranolol (INDERAL) 20 MG tablet TAKE 1 TABLET TWICE DAILY 180 tablet 1   QUEtiapine (SEROQUEL) 100 MG tablet TAKE 1 TABLET BY MOUTH EVERYDAY AT BEDTIME 30 tablet 1   valACYclovir (VALTREX) 1000 MG tablet Take 1 tablet (1,000 mg total) by mouth 3 (three) times daily. 21 tablet 0   No current facility-administered medications for this visit.     Psychiatric Specialty Exam: Review of Systems  Cardiovascular:  Negative for chest pain.  Neurological:  Negative for tremors.  Psychiatric/Behavioral:  Positive for sleep disturbance. Negative for agitation, hallucinations and self-injury.     There were no vitals taken for this visit.There is no height or weight on file to calculate BMI.  General Appearance: Casual  Eye Contact:  Fair  Speech:  Normal Rate  Volume:  Decreased  Mood: fair  Affect:  Congruent  Thought Process:  Goal Directed  Orientation:  Full (Time, Place, and Person)  Thought Content:  Rumination  Suicidal Thoughts:  No  Homicidal Thoughts:  No  Memory:  Immediate;   Fair  Judgement:  Fair  Insight:  Shallow  Psychomotor Activity:  Decreased  Concentration:  Concentration: Fair  Recall:  Fair  Fund of Knowledge:Good  Language: Good  Akathisia:  No  Handed:    AIMS (if indicated):  no involuntary movements  Assets:  Desire for Improvement Housing  ADL's:  Intact  Cognition: WNL  Sleep:  Fair   Screenings: GAD-7    Flowsheet Row Office Visit from 03/04/2021 in [facility]  Total GAD-7 Score 12      PHQ2-9    Flowsheet Row Clinical Support from 10/09/2021 in [facility] Office Visit from 05/01/2021 [facility] [place] Office Visit from 03/04/2021 in [facility] At [facility] Office Visit  from 02/18/2020 in [facility] Office Visit from 03/08/2017 in [facility] At [facility]  [place]  PHQ-2 Total Score 0 2 2 6 3   PHQ-9 Total Score 0 7 14 13 9       Flowsheet Row Office Visit from 08/13/2021 in [facility] Office Visit from 06/16/2021 in [facility] Office Visit from 05/01/2021 in [facility] No Risk No Risk No Risk       Assessment and Plan: as follows  Prior documentation reviewed  Bipolar disorder current episode depressed; fair continue cymbalta, seroquel. No tremors  Continue Cymbalta for depression 60 mg once a day  Generalized anxiety disorder: at times has taken extra buspar ,can do so, need to call earlier if needed, work on distraction. Continue cymbalta  Lonliness: handling it better, has a friend who lives with her now Insomnia:at times difficult, change time of seroquel to earlier one hour work on sleep hygiene  Fu 77m. Cymbalta refills sent  Merian Capron, MD 1/15/20243:11 PM

## 2022-03-14 ENCOUNTER — Encounter: Payer: Self-pay | Admitting: Certified Registered Nurse Anesthetist

## 2022-03-18 NOTE — Telephone Encounter (Signed)
Appointment has been scheduled for 03-30-2022 at the [place] office.

## 2022-03-22 ENCOUNTER — Ambulatory Visit (AMBULATORY_SURGERY_CENTER): Payer: Medicare PPO | Admitting: Gastroenterology

## 2022-03-22 ENCOUNTER — Encounter: Payer: Self-pay | Admitting: Gastroenterology

## 2022-03-22 VITALS — BP 116/89 | HR 85 | Temp 97.5°F | Resp 11 | Ht 64.0 in | Wt 157.0 lb

## 2022-03-22 DIAGNOSIS — K317 Polyp of stomach and duodenum: Secondary | ICD-10-CM | POA: Diagnosis not present

## 2022-03-22 DIAGNOSIS — R12 Heartburn: Secondary | ICD-10-CM | POA: Diagnosis not present

## 2022-03-22 DIAGNOSIS — K219 Gastro-esophageal reflux disease without esophagitis: Secondary | ICD-10-CM

## 2022-03-22 DIAGNOSIS — K3189 Other diseases of stomach and duodenum: Secondary | ICD-10-CM | POA: Diagnosis not present

## 2022-03-22 DIAGNOSIS — R1013 Epigastric pain: Secondary | ICD-10-CM

## 2022-03-22 MED ORDER — SODIUM CHLORIDE 0.9 % IV SOLN: 500.0000 mL | Freq: Once | INTRAVENOUS | Status: DC

## 2022-03-22 NOTE — Progress Notes (Signed)
1007 Robinul 0.1 mg IV given due large amount of secretions upon assessment.  MD made aware, vss 

## 2022-03-22 NOTE — Progress Notes (Signed)
Report given to PACU, vss 

## 2022-03-22 NOTE — Patient Instructions (Signed)
YOU HAD AN ENDOSCOPIC PROCEDURE TODAY AT [facility]:   Refer to the procedure report that was given to you for any specific questions about what was found during the examination.  If the procedure report does not answer your questions, please call your gastroenterologist to clarify.  If you requested that your care partner not be given the details of your procedure findings, then the procedure report has been included in a sealed envelope for you to review at your convenience later.  YOU SHOULD EXPECT: Some feelings of bloating in the abdomen. Passage of more gas than usual.  Walking can help get rid of the air that was put into your GI tract during the procedure and reduce the bloating. If you had a lower endoscopy (such as a colonoscopy or flexible sigmoidoscopy) you may notice spotting of blood in your stool or on the toilet paper. If you underwent a bowel prep for your procedure, you may not have a normal bowel movement for a few days.  Please Note:  You might notice some irritation and congestion in your nose or some drainage.  This is from the oxygen used during your procedure.  There is no need for concern and it should clear up in a day or so.  SYMPTOMS TO REPORT IMMEDIATELY:   Following upper endoscopy (EGD)  Vomiting of blood or coffee ground material  New chest pain or pain under the shoulder blades  Painful or persistently difficult swallowing  New shortness of breath  Fever of 100F or higher  Black, tarry-looking stools  For urgent or emergent issues, a gastroenterologist can be reached at any hour by calling [phone]. Do not use MyChart messaging for urgent concerns.    DIET:  We do recommend a small meal at first, but then you may proceed to your regular diet.  Drink plenty of fluids but you should avoid alcoholic beverages for 24 hours.  MEDICATIONS: Continue present medications.  FOLLOW UP: Await pathology results and return to Dr. Corena Pilgrim office as  needed.  Thank you for allowing [place] to provide for your healthcare needs today.  ACTIVITY:  You should plan to take it easy for the rest of today and you should NOT DRIVE or use heavy machinery until tomorrow (because of the sedation medicines used during the test).    FOLLOW UP: Our staff will call the number listed on your records the next business day following your procedure.  We will call around 7:15- 8:00 am to check on you and address any questions or concerns that you may have regarding the information given to you following your procedure. If we do not reach you, we will leave a message.     If any biopsies were taken you will be contacted by phone or by letter within the next 1-3 weeks.  Please call us at [phone] if you have not heard about the biopsies in 3 weeks.    SIGNATURES/CONFIDENTIALITY: You and/or your care partner have signed paperwork which will be entered into your electronic medical record.  These signatures attest to the fact that that the information above on your After Visit Summary has been reviewed and is understood.  Full responsibility of the confidentiality of this discharge information lies with you and/or your care-partner.

## 2022-03-22 NOTE — Progress Notes (Signed)
Called to room to assist during endoscopic procedure.  Patient ID and intended procedure confirmed with present staff. Received instructions for my participation in the procedure from the performing physician.  

## 2022-03-22 NOTE — Op Note (Signed)
[facility] Patient Name: Amber Hanson Procedure Date: 03/22/2022 9:59 AM MRN: [id number] Endoscopist: Mallie Mussel L. Loletha Carrow , MD, [id number] Age: 62 Referring MD:  Date of Birth: Nov 17, 1960 Gender: Female Account #: [id number] Procedure:                Upper GI endoscopy Indications:              Epigastric abdominal pain (persisted after probable                            acute infectious illness but slowly subsiding),                           Heartburn, Screening for Barrett's esophagus in                            patient at risk for this condition (clinical                            details in office consult note 03/04/2022) Medicines:                Monitored Anesthesia Care Procedure:                Pre-Anesthesia Assessment:                           - Prior to the procedure, a History and Physical                            was performed, and patient medications and                            allergies were reviewed. The patient's tolerance of                            previous anesthesia was also reviewed. The risks                            and benefits of the procedure and the sedation                            options and risks were discussed with the patient.                            All questions were answered, and informed consent                            was obtained. Prior Anticoagulants: The patient has                            taken no anticoagulant or antiplatelet agents. ASA                            Grade Assessment: II - A patient with mild systemic  disease. After reviewing the risks and benefits,                            the patient was deemed in satisfactory condition to                            undergo the procedure.                           After obtaining informed consent, the endoscope was                            passed under direct vision. Throughout the                            procedure, the patient's  blood pressure, pulse, and                            oxygen saturations were monitored continuously. The                            GIF [id number] #[id number] was introduced through the                            mouth, and advanced to the second part of duodenum.                            The upper GI endoscopy was accomplished without                            difficulty. The patient tolerated the procedure                            well. Scope In: Scope Out: Findings:                 There is no endoscopic evidence of Barrett's                            esophagus, esophagitis, hiatal hernia or stricture                            in the entire esophagus.                           Multiple sessile fundic gland polyps were found in                            the gastric fundus and in the gastric body.                            (Examined under WL and NBI without                            suspicious-appearing lesions)  Normal mucosa was found in the entire examined                            stomach. Biopsies were taken with a cold forceps                            for histology. (Rule out H. pylori with recent UGI                            symptoms)                           The exam of the stomach was otherwise normal.                            (Including on retroflexion)                           The examined duodenum was normal. Complications:            No immediate complications. Estimated Blood Loss:     Estimated blood loss was minimal. Impression:               - Multiple fundic gland polyps.                           - Normal mucosa was found in the entire stomach.                            Biopsied.                           - Normal examined duodenum. Recommendation:           - Patient has a contact number available for                            emergencies. The signs and symptoms of potential                            delayed complications  were discussed with the                            patient. Return to normal activities tomorrow.                            Written discharge instructions were provided to the                            patient.                           - Resume previous diet.                           - Continue present medications.                           -  Await pathology results.                           - Return to my office PRN. Arliene Rosenow L. Loletha Carrow, MD 03/22/2022 10:28:36 AM This report has been signed electronically.

## 2022-03-22 NOTE — Progress Notes (Signed)
No changes to clinical history since GI office visit on 03/04/22.  The patient is appropriate for an endoscopic procedure in the ambulatory setting.  - Eliodoro Gullett Danis, MD    

## 2022-03-22 NOTE — Progress Notes (Signed)
Pt's states no medical or surgical changes since previsit or office visit. 

## 2022-03-23 ENCOUNTER — Other Ambulatory Visit: Payer: Self-pay

## 2022-03-23 ENCOUNTER — Telehealth: Payer: Self-pay

## 2022-03-23 DIAGNOSIS — Z780 Asymptomatic menopausal state: Secondary | ICD-10-CM

## 2022-03-23 DIAGNOSIS — Z79890 Hormone replacement therapy (postmenopausal): Secondary | ICD-10-CM

## 2022-03-23 MED ORDER — ESTRADIOL 1 MG PO TABS: 1.0000 mg | ORAL_TABLET | Freq: Every day | ORAL | 0 refills | Status: DC

## 2022-03-23 NOTE — Telephone Encounter (Signed)
  Follow up Call-     03/22/2022    9:18 AM 12/25/2020    7:31 AM  Call back number  Post procedure Call Back phone  # [phone]  Permission to leave phone message Yes Yes     Patient questions:  Do you have a fever, pain , or abdominal swelling? No. Pain Score  0 *  Have you tolerated food without any problems? Yes.    Have you been able to return to your normal activities? Yes.    Do you have any questions about your discharge instructions: Diet   No. Medications  No. Follow up visit  No.  Do you have questions or concerns about your Care? No.  Actions: * If pain score is 4 or above: No action needed, pain <4.

## 2022-03-26 ENCOUNTER — Encounter: Payer: Self-pay | Admitting: Gastroenterology

## 2022-04-16 ENCOUNTER — Other Ambulatory Visit: Payer: Self-pay

## 2022-04-16 NOTE — Progress Notes (Unsigned)
Pt has requested Cele[facility]. Please advise.

## 2022-04-18 ENCOUNTER — Other Ambulatory Visit: Payer: Self-pay | Admitting: Family Medicine

## 2022-04-18 DIAGNOSIS — G8929 Other chronic pain: Secondary | ICD-10-CM

## 2022-04-18 DIAGNOSIS — M1990 Unspecified osteoarthritis, unspecified site: Secondary | ICD-10-CM

## 2022-04-18 MED ORDER — CELECOXIB 200 MG PO CAPS: 200.0000 mg | ORAL_CAPSULE | Freq: Two times a day (BID) | ORAL | 0 refills | Status: DC

## 2022-04-29 ENCOUNTER — Encounter: Payer: Self-pay | Admitting: Family Medicine

## 2022-04-29 ENCOUNTER — Ambulatory Visit (INDEPENDENT_AMBULATORY_CARE_PROVIDER_SITE_OTHER): Payer: Medicare PPO | Admitting: Family Medicine

## 2022-04-29 ENCOUNTER — Telehealth: Payer: Self-pay

## 2022-04-29 ENCOUNTER — Other Ambulatory Visit (HOSPITAL_COMMUNITY): Payer: Self-pay | Admitting: Psychiatry

## 2022-04-29 VITALS — BP 149/93 | HR 84 | Temp 98.1°F | Ht 64.0 in | Wt 155.2 lb

## 2022-04-29 DIAGNOSIS — L237 Allergic contact dermatitis due to plants, except food: Secondary | ICD-10-CM | POA: Diagnosis not present

## 2022-04-29 MED ORDER — METHYLPREDNISOLONE 4 MG PO TBPK: ORAL_TABLET | ORAL | 0 refills | Status: DC

## 2022-04-29 NOTE — Telephone Encounter (Signed)
Pt lvm stating possible poison ivy/oak. She was seen at [facility] today.

## 2022-04-29 NOTE — Patient Instructions (Signed)
Prednisone warning: Prednisone is an anti-inflammatory medication. It is best to only use it for short periods of time. It can cause  your blood pressure to increase and it can cause your blood sugar to increase. If you are a diabetic, please check your blood sugar twice per day while you are taking prednisone. If your sugars are > 200 or < 70 please seek medical attention right away.    Watch for signs of increased effects of taking Seroquel.

## 2022-04-29 NOTE — Assessment & Plan Note (Addendum)
Working in yard one week ago. Diffuse erythematous papular rash on upper chest, left ear, forearms, ankles, and feet. Medrol dose pak ordered. She will monitor for increased effects of Seroquel. She reports she has been on Seroquel for years and would know if she felt different. Has taken steroids in the past while being on Seroquel without adverse effects.  Supportive care as instructed. Topical oatmeal bath and benadryl cream for comfort.

## 2022-04-29 NOTE — Progress Notes (Signed)
Established Patient Office Visit  Subjective   Patient ID: Amber Hanson, female    DOB: 04-May-1960  Age: 62 y.o. MRN: [id number]  Chief Complaint  Patient presents with   Poison Ivy    Starting 1 week ago while working in her yard. She has used OTC Benadryl and has seemed for helped for itching.     HPI Presents today for an acute visit with complaint of poison ivy after working in the yard. On chest, left ear, both ankles, tops of feet, forearms.  Symptoms have been present  one week  Associated symptoms include: itching  Pertinent negatives: no fever or chills Pain severity: 0/10, just itching  Treatments tried include : benadryl cream  Treatment effective : helps itching some Sick contacts : n/a  Elevated blood pressure in clinic. Reports she was "running around" to get here. Chart review reveals normal blood pressures at previous visits. She will monitor at home and notify PCP if does not return to normal.   Review of Systems  Respiratory:  Negative for shortness of breath.   Cardiovascular:  Negative for chest pain.  Skin:  Positive for itching and rash.      Objective:     BP (!) 149/93   Pulse 84   Temp 98.1 F (36.7 C) (Oral)   Ht '5\' 4"'$  (1.626 m)   Wt 155 lb 3.2 oz (70.4 kg)   LMP  (LMP Unknown)   SpO2 98%   BMI 26.64 kg/m  BP Readings from Last 3 Encounters:  04/29/22 (!) 149/93  03/22/22 116/89  03/04/22 120/70      Physical Exam Vitals and nursing note reviewed.  Constitutional:      General: She is not in acute distress.    Appearance: Normal appearance.  Pulmonary:     Effort: Pulmonary effort is normal.  Skin:    General: Skin is warm and dry.     Findings: Rash present. Rash is papular.     Comments: Erythematous papular rash on upper chest, left ear, forearms, both ankles, and tops of feet. No involvement on face or close to eyes.   Neurological:     General: No focal deficit present.     Mental Status: She is alert. Mental status is at  baseline.  Psychiatric:        Mood and Affect: Mood normal.        Behavior: Behavior normal.        Thought Content: Thought content normal.        Judgment: Judgment normal.     No results found for any visits on 04/29/22.    The 10-year ASCVD risk score (Arnett DK, et al., 2019) is: 9.7%    Assessment & Plan:   Problem List Items Addressed This Visit     Contact dermatitis due to poison ivy - Primary    Working in yard one week ago. Diffuse erythematous papular rash on upper chest, left ear, forearms, ankles, and feet. Medrol dose pak ordered. She will monitor for increased effects of Seroquel. She reports she has been on Seroquel for years and would know if she felt different. Has taken steroids in the past while being on Seroquel without adverse effects.  Supportive care as instructed. Topical oatmeal bath and benadryl cream for comfort.       Relevant Medications   methylPREDNISolone (MEDROL DOSEPAK) 4 MG TBPK tablet   Agrees with plan of care discussed.  Questions answered. Follow-up with PCP for medication  refills as discussed.   Return if symptoms worsen or fail to improve.    Chalmers Guest, FNP

## 2022-05-04 ENCOUNTER — Other Ambulatory Visit: Payer: Self-pay | Admitting: Family Medicine

## 2022-05-04 DIAGNOSIS — K219 Gastro-esophageal reflux disease without esophagitis: Secondary | ICD-10-CM

## 2022-05-07 ENCOUNTER — Other Ambulatory Visit: Payer: Self-pay

## 2022-05-07 DIAGNOSIS — M1990 Unspecified osteoarthritis, unspecified site: Secondary | ICD-10-CM

## 2022-05-07 DIAGNOSIS — M545 Low back pain, unspecified: Secondary | ICD-10-CM

## 2022-05-28 ENCOUNTER — Telehealth (HOSPITAL_COMMUNITY): Payer: Self-pay | Admitting: Psychiatry

## 2022-05-28 DIAGNOSIS — F319 Bipolar disorder, unspecified: Secondary | ICD-10-CM

## 2022-05-28 DIAGNOSIS — F419 Anxiety disorder, unspecified: Secondary | ICD-10-CM

## 2022-05-28 MED ORDER — BUSPIRONE HCL 15 MG PO TABS: ORAL_TABLET | ORAL | 0 refills | Status: DC

## 2022-05-28 NOTE — Telephone Encounter (Signed)
Patient called reporting increased and severe anxiety over the past 2 weeks with "violent and aggressive nightmares." States the nightmares wake her up screaming at times but is able to go back to sleep. States this has happened previously. Requesting provider feedback on what to do and if additional medications are ordered, consideration be given to something that doesn't have am associated weight gain.  Phone # [phone]  [phone] - [place], [place] - [address] Phone: [phone]  Fax: [phone]     Last visit: 03/08/2022  Next visit: 06/16/2022

## 2022-06-01 ENCOUNTER — Other Ambulatory Visit: Payer: Self-pay | Admitting: Family Medicine

## 2022-06-01 ENCOUNTER — Ambulatory Visit (INDEPENDENT_AMBULATORY_CARE_PROVIDER_SITE_OTHER): Payer: Medicare PPO | Admitting: Sports Medicine

## 2022-06-01 ENCOUNTER — Other Ambulatory Visit (HOSPITAL_COMMUNITY): Payer: Self-pay | Admitting: Psychiatry

## 2022-06-01 ENCOUNTER — Ambulatory Visit (INDEPENDENT_AMBULATORY_CARE_PROVIDER_SITE_OTHER): Payer: Medicare PPO

## 2022-06-01 ENCOUNTER — Other Ambulatory Visit (INDEPENDENT_AMBULATORY_CARE_PROVIDER_SITE_OTHER): Payer: Medicare PPO

## 2022-06-01 DIAGNOSIS — M7542 Impingement syndrome of left shoulder: Secondary | ICD-10-CM

## 2022-06-01 DIAGNOSIS — M19012 Primary osteoarthritis, left shoulder: Secondary | ICD-10-CM | POA: Diagnosis not present

## 2022-06-01 DIAGNOSIS — K219 Gastro-esophageal reflux disease without esophagitis: Secondary | ICD-10-CM

## 2022-06-01 DIAGNOSIS — G5603 Carpal tunnel syndrome, bilateral upper limbs: Secondary | ICD-10-CM | POA: Diagnosis not present

## 2022-06-01 DIAGNOSIS — F319 Bipolar disorder, unspecified: Secondary | ICD-10-CM

## 2022-06-01 DIAGNOSIS — F419 Anxiety disorder, unspecified: Secondary | ICD-10-CM

## 2022-06-01 NOTE — Assessment & Plan Note (Signed)
Increasing pain left shoulder over the deltoid worse with abduction, history of right shoulder arthroplasty. Mostly impingement signs on exam, subacromial injection, home physical therapy, left shoulder x-ray, return to see me in 6 weeks, MRI if no better.

## 2022-06-01 NOTE — Progress Notes (Signed)
    Procedures performed today:    Procedure: Real-time Ultrasound Guided injection of the left subacromial bursa Device: Samsung HS60  Verbal informed consent obtained.  Time-out conducted.  Noted no overlying erythema, induration, or other signs of local infection.  Skin prepped in a sterile fashion.  Local anesthesia: Topical Ethyl chloride.  With sterile technique and under real time ultrasound guidance: Intact cuff, 1 cc Kenalog 40, 1 cc lidocaine, 1 cc bupivacaine injected easily Completed without difficulty  Advised to call if fevers/chills, erythema, induration, drainage, or persistent bleeding.  Images permanently stored and available for review in PACS.  Impression: Technically successful ultrasound guided injection.  Independent interpretation of notes and tests performed by another provider:   None.  Brief History, Exam, Impression, and Recommendations:    Impingement syndrome, shoulder, left Increasing pain left shoulder over the deltoid worse with abduction, history of right shoulder arthroplasty. Mostly impingement signs on exam, subacromial injection, home physical therapy, left shoulder x-ray, return to see me in 6 weeks, MRI if no better.  Bilateral carpal tunnel syndrome Bilateral positive Tinel's and Phalen signs, adding home splinting and conditioning, return to see me in 6 weeks, hydrodissection if not better.    ____________________________________________ Ihor Austin. Benjamin Stain, M.D., ABFM., CAQSM., AME. Primary Care and Sports Medicine [facility]  Adjunct Professor of Family Medicine  [employer]  Restaurant manager, fast food

## 2022-06-01 NOTE — Assessment & Plan Note (Signed)
Bilateral positive Tinel's and Phalen signs, adding home splinting and conditioning, return to see me in 6 weeks, hydrodissection if not better.

## 2022-06-16 ENCOUNTER — Telehealth (HOSPITAL_COMMUNITY): Payer: Medicare PPO | Admitting: Psychiatry

## 2022-06-17 ENCOUNTER — Telehealth: Payer: Self-pay | Admitting: Family Medicine

## 2022-06-17 NOTE — Telephone Encounter (Signed)
Left a voicemail for patient to call back and scheduled an appointment.

## 2022-06-17 NOTE — Telephone Encounter (Signed)
Patient was seen 04/29/2022 by Dorena Bodo for poison oak. Patient has it again and is requesting to have a steroid cream or pill called in for the poison oak.

## 2022-06-18 ENCOUNTER — Encounter: Payer: Self-pay | Admitting: Family Medicine

## 2022-06-18 ENCOUNTER — Ambulatory Visit (INDEPENDENT_AMBULATORY_CARE_PROVIDER_SITE_OTHER): Payer: Medicare PPO | Admitting: Family Medicine

## 2022-06-18 VITALS — BP 111/80 | HR 87 | Temp 97.7°F | Ht 64.0 in | Wt 157.4 lb

## 2022-06-18 DIAGNOSIS — L237 Allergic contact dermatitis due to plants, except food: Secondary | ICD-10-CM

## 2022-06-18 MED ORDER — PREDNISONE 10 MG PO TABS: ORAL_TABLET | ORAL | 0 refills | Status: AC

## 2022-06-18 NOTE — Telephone Encounter (Signed)
Good morning Amber Hanson,  Patient was seen this morning at 9:10 am for this issue.

## 2022-06-18 NOTE — Assessment & Plan Note (Signed)
-   will go ahead and treat with 15 day steroid course. Longer course needed to prevent rebound dermatitis - pt will follow up with me in one week to assess

## 2022-06-18 NOTE — Progress Notes (Signed)
   Acute Office Visit  Subjective:     Patient ID: Amber Hanson, female    DOB: 1960-04-25, 62 y.o.   MRN: [id number]  Chief Complaint  Patient presents with   Rash    Pt states that she was doing yard work last Thursday and has been since then. She has rash covering both arms, legs and chest. She has been using Benadryl cream, but still has not subsided.      HPI Patient is in today for poison oak exposure on shoulders, back, and legs.  Review of Systems  Constitutional:  Negative for chills and fever.  Respiratory:  Negative for cough and shortness of breath.   Cardiovascular:  Negative for chest pain.  Skin:  Positive for rash.  Neurological:  Negative for headaches.        Objective:    BP 111/80   Pulse 87   Temp 97.7 F (36.5 C) (Oral)   Ht 5\' 4"  (1.626 m)   Wt 157 lb 6.4 oz (71.4 kg)   LMP  (LMP Unknown)   SpO2 99%   BMI 27.02 kg/m    Physical Exam Vitals reviewed.  Constitutional:      Appearance: She is well-developed.  HENT:     Head: Normocephalic and atraumatic.  Eyes:     Conjunctiva/sclera: Conjunctivae normal.  Cardiovascular:     Rate and Rhythm: Normal rate.  Pulmonary:     Effort: Pulmonary effort is normal.  Skin:    Coloration: Skin is not pale.     Findings: Rash present.     Comments: Posion ivy rash on upper right arm. Left chest , neck  Neurological:     Mental Status: She is alert and oriented to person, place, and time.  Psychiatric:        Behavior: Behavior normal.     No results found for any visits on 06/18/22.      Assessment & Plan:   Problem List Items Addressed This Visit       Musculoskeletal and Integument   Poison ivy dermatitis - Primary    - will go ahead and treat with 15 day steroid course. Longer course needed to prevent rebound dermatitis - pt will follow up with me in one week to assess       Relevant Medications   predniSONE (DELTASONE) 10 MG tablet     Meds ordered this encounter   Medications   predniSONE (DELTASONE) 10 MG tablet    Sig: Take 4 tablets (40 mg total) by mouth daily with breakfast for 5 days, THEN 2 tablets (20 mg total) daily with breakfast for 5 days, THEN 1 tablet (10 mg total) daily with breakfast for 5 days. Take 1 tablet by mouth daily for 5 days after completion of 20mg  for 5 days..    Dispense:  35 tablet    Refill:  0    Return in about 1 week (around 06/25/2022).  Charlton Amor, DO

## 2022-06-28 ENCOUNTER — Other Ambulatory Visit: Payer: Self-pay | Admitting: Family Medicine

## 2022-06-28 ENCOUNTER — Other Ambulatory Visit (HOSPITAL_COMMUNITY): Payer: Self-pay | Admitting: Psychiatry

## 2022-06-28 ENCOUNTER — Ambulatory Visit (INDEPENDENT_AMBULATORY_CARE_PROVIDER_SITE_OTHER): Payer: Medicare PPO | Admitting: Family Medicine

## 2022-06-28 ENCOUNTER — Encounter: Payer: Self-pay | Admitting: Family Medicine

## 2022-06-28 VITALS — BP 153/97 | HR 84 | Ht 64.0 in | Wt 157.0 lb

## 2022-06-28 DIAGNOSIS — F419 Anxiety disorder, unspecified: Secondary | ICD-10-CM

## 2022-06-28 DIAGNOSIS — L237 Allergic contact dermatitis due to plants, except food: Secondary | ICD-10-CM | POA: Diagnosis not present

## 2022-06-28 DIAGNOSIS — K219 Gastro-esophageal reflux disease without esophagitis: Secondary | ICD-10-CM

## 2022-06-28 DIAGNOSIS — F319 Bipolar disorder, unspecified: Secondary | ICD-10-CM

## 2022-06-28 NOTE — Progress Notes (Signed)
   Acute Office Visit  Subjective:     Patient ID: Amber Hanson, female    DOB: [birth year], 62 y.o.   MRN: [id number]  Chief Complaint  Patient presents with   Rash    HPI Patient is in today for one week follow up on poison ivy dermatitis and was given a 15day steroid course. Poison ivy was located on her upper right arm, left chest, and neck area. Today, she has had improvement of symptoms.   Review of Systems  Constitutional:  Negative for chills and fever.  Respiratory:  Negative for cough and shortness of breath.   Cardiovascular:  Negative for chest pain.  Neurological:  Negative for headaches.        Objective:    BP (!) 153/97   Pulse 84   LMP  (LMP Unknown)   SpO2 100%    Physical Exam Vitals and nursing note reviewed.  Constitutional:      General: She is not in acute distress.    Appearance: Normal appearance.  HENT:     Head: Normocephalic and atraumatic.     Right Ear: External ear normal.     Left Ear: External ear normal.     Nose: Nose normal.  Eyes:     Conjunctiva/sclera: Conjunctivae normal.  Cardiovascular:     Rate and Rhythm: Normal rate and regular rhythm.  Pulmonary:     Effort: Pulmonary effort is normal.     Breath sounds: Normal breath sounds.  Neurological:     General: No focal deficit present.     Mental Status: She is alert and oriented to person, place, and time.  Psychiatric:        Mood and Affect: Mood normal.        Behavior: Behavior normal.        Thought Content: Thought content normal.        Judgment: Judgment normal.     No results found for any visits on 06/28/22.      Assessment & Plan:   Problem List Items Addressed This Visit       Musculoskeletal and Integument   Poison ivy dermatitis - Primary    Presents for follow-up on her poison ivy.  Rash is improved today. I did discuss that she needs to continue the 5 more days of prednisone to make sure that this rash does not come back.       No orders  of the defined types were placed in this encounter.   No follow-ups on file.  Charlton Amor, DO

## 2022-06-28 NOTE — Assessment & Plan Note (Addendum)
Presents for follow-up on her poison ivy.  Rash is improved today. I did discuss that she needs to continue the 5 more days of prednisone to make sure that this rash does not come back.

## 2022-07-01 ENCOUNTER — Encounter (HOSPITAL_COMMUNITY): Payer: Self-pay

## 2022-07-01 ENCOUNTER — Telehealth (HOSPITAL_COMMUNITY): Payer: Medicare PPO | Admitting: Psychiatry

## 2022-07-02 ENCOUNTER — Encounter (HOSPITAL_COMMUNITY): Payer: Self-pay | Admitting: Psychiatry

## 2022-07-02 ENCOUNTER — Telehealth (INDEPENDENT_AMBULATORY_CARE_PROVIDER_SITE_OTHER): Payer: Medicare PPO | Admitting: Psychiatry

## 2022-07-02 DIAGNOSIS — F319 Bipolar disorder, unspecified: Secondary | ICD-10-CM

## 2022-07-02 DIAGNOSIS — F419 Anxiety disorder, unspecified: Secondary | ICD-10-CM | POA: Diagnosis not present

## 2022-07-02 DIAGNOSIS — F5102 Adjustment insomnia: Secondary | ICD-10-CM | POA: Diagnosis not present

## 2022-07-02 DIAGNOSIS — F411 Generalized anxiety disorder: Secondary | ICD-10-CM
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
## 2022-07-06 ENCOUNTER — Other Ambulatory Visit: Payer: Self-pay | Admitting: Family Medicine

## 2022-07-06 ENCOUNTER — Other Ambulatory Visit (HOSPITAL_COMMUNITY): Payer: Self-pay | Admitting: Psychiatry

## 2022-07-06 DIAGNOSIS — F319 Bipolar disorder, unspecified: Secondary | ICD-10-CM

## 2022-07-06 DIAGNOSIS — G8929 Other chronic pain: Secondary | ICD-10-CM

## 2022-07-06 DIAGNOSIS — F419 Anxiety disorder, unspecified: Secondary | ICD-10-CM

## 2022-07-06 DIAGNOSIS — Z79890 Hormone replacement therapy (postmenopausal): Secondary | ICD-10-CM

## 2022-07-06 DIAGNOSIS — Z780 Asymptomatic menopausal state: Secondary | ICD-10-CM

## 2022-07-06 DIAGNOSIS — M1990 Unspecified osteoarthritis, unspecified site: Secondary | ICD-10-CM

## 2022-07-12 ENCOUNTER — Telehealth (HOSPITAL_COMMUNITY): Payer: Self-pay

## 2022-07-12 MED ORDER — QUETIAPINE FUMARATE 100 MG PO TABS: ORAL_TABLET | ORAL | 1 refills | Status: DC

## 2022-07-12 NOTE — Telephone Encounter (Signed)
Medication refill - Fax from patient's [facility] in [place] for a new Quetiapine 100 mg order, last provided 04/30/22 + 1 refill. Pt. last seen 07/02/22 and returns next on 10/04/22.

## 2022-07-13 ENCOUNTER — Ambulatory Visit: Payer: Medicare PPO | Admitting: Sports Medicine

## 2022-07-13 ENCOUNTER — Ambulatory Visit (INDEPENDENT_AMBULATORY_CARE_PROVIDER_SITE_OTHER): Payer: Medicare PPO | Admitting: Family Medicine

## 2022-07-13 ENCOUNTER — Encounter: Payer: Self-pay | Admitting: Family Medicine

## 2022-07-13 ENCOUNTER — Ambulatory Visit (INDEPENDENT_AMBULATORY_CARE_PROVIDER_SITE_OTHER): Payer: Medicare PPO

## 2022-07-13 VITALS — BP 123/92 | HR 97 | Ht 64.0 in | Wt 154.2 lb

## 2022-07-13 DIAGNOSIS — M25552 Pain in left hip: Secondary | ICD-10-CM

## 2022-07-13 DIAGNOSIS — L719 Rosacea, unspecified: Secondary | ICD-10-CM

## 2022-07-13 MED ORDER — AZELAIC ACID 15 % EX GEL
1.0000 | Freq: Two times a day (BID) | CUTANEOUS | 0 refills | Status: DC
Start: 2022-07-13 — End: 2022-07-13

## 2022-07-13 MED ORDER — AZELAIC ACID 15 % EX GEL
1.0000 | Freq: Two times a day (BID) | CUTANEOUS | 0 refills | Status: AC
Start: 2022-07-13 — End: ?

## 2022-07-13 NOTE — Progress Notes (Signed)
Acute Office Visit  Subjective:     Patient ID: Amber Hanson, female    DOB: [birth year], 62 y.o.   MRN: [id number]  Chief Complaint  Patient presents with   Fall    Patietn fell on Friday 07/09/22- bruise to  Left lower leg, Left knee and Left hip pain  when laying down - also lesion on Left elbow after fall.    Rash    Bilateral cheeks rash  - stinging sensation but not pruritic- states areas was raised x Thursday 07/08/22. Also c/o possible poison ivey on right side of neck x few days.     HPI Patient is in today for facial rash. She said it started a couple days ago. She is concerned it is rosacea. She says she has had this flare up about 4 times in the last year or so. Denies any new soaps, detergents, etc.   Pt had a fall 5 days ago and has bad pain and swelling of her hip.  Review of Systems  Constitutional:  Negative for chills and fever.  Respiratory:  Negative for cough and shortness of breath.   Cardiovascular:  Negative for chest pain.  Skin:  Positive for rash.  Neurological:  Negative for headaches.        Objective:    BP (!) 123/92   Pulse 97   Ht 5\' 4"  (1.626 m)   Wt 154 lb 4 oz (70 kg)   LMP  (LMP Unknown)   SpO2 99%   BMI 26.48 kg/m    Physical Exam Vitals and nursing note reviewed.  Constitutional:      General: She is not in acute distress.    Appearance: Normal appearance.  HENT:     Head: Normocephalic and atraumatic.     Comments: Rash of face with visible telangiectasias.     Right Ear: External ear normal.     Left Ear: External ear normal.     Nose: Nose normal.  Eyes:     Conjunctiva/sclera: Conjunctivae normal.  Cardiovascular:     Rate and Rhythm: Normal rate and regular rhythm.  Pulmonary:     Effort: Pulmonary effort is normal.     Breath sounds: Normal breath sounds.  Skin:    Comments: Bruising of lower extremities bilaterally Pain of left hip  Neurological:     General: No focal deficit present.     Mental Status: She  is alert and oriented to person, place, and time.  Psychiatric:        Mood and Affect: Mood normal.        Behavior: Behavior normal.        Thought Content: Thought content normal.        Judgment: Judgment normal.     No results found for any visits on 07/13/22.      Assessment & Plan:   Problem List Items Addressed This Visit       Other   Hip pain, left - Primary    - due to pt's level of pain and age will go ahead and order a hip xray to evaluate for a fracture.      Relevant Orders   DG HIP UNILAT W OR W/O PELVIS 2-3 VIEWS LEFT   Rosacea    - facial rash looks similar to rosacea. Will go ahead and treat with azelaic acid as first line treatment. Will also send referral to dermatology      Relevant Medications   Azelaic Acid 15 %  gel   Other Relevant Orders   Ambulatory referral to Dermatology    Meds ordered this encounter  Medications   DISCONTD: Azelaic Acid 15 % gel    Sig: Apply 1 Application topically 2 (two) times daily.    Dispense:  30 g    Refill:  0   Azelaic Acid 15 % gel    Sig: Apply 1 Application topically 2 (two) times daily.    Dispense:  30 g    Refill:  0    Return with PCP.  Charlton Amor, DO

## 2022-07-13 NOTE — Assessment & Plan Note (Signed)
-   due to pt's level of pain and age will go ahead and order a hip xray to evaluate for a fracture.

## 2022-07-13 NOTE — Assessment & Plan Note (Signed)
-   facial rash looks similar to rosacea. Will go ahead and treat with azelaic acid as first line treatment. Will also send referral to dermatology

## 2022-07-14 ENCOUNTER — Telehealth (HOSPITAL_COMMUNITY): Payer: Self-pay

## 2022-07-14 MED ORDER — QUETIAPINE FUMARATE 100 MG PO TABS: ORAL_TABLET | ORAL | 0 refills | Status: DC

## 2022-07-14 NOTE — Telephone Encounter (Signed)
Medication refill - [facility] requesting a 90 day order for patient's prescribed Quetiapine 100 mg, one at bedtime. Medication last ordered 07/12/22 for 30 days + 1 refill and returns next on 10/04/22.

## 2022-07-15 ENCOUNTER — Encounter: Payer: Self-pay | Admitting: Family Medicine

## 2022-07-22 ENCOUNTER — Ambulatory Visit (INDEPENDENT_AMBULATORY_CARE_PROVIDER_SITE_OTHER): Payer: Medicare PPO

## 2022-07-22 ENCOUNTER — Ambulatory Visit (INDEPENDENT_AMBULATORY_CARE_PROVIDER_SITE_OTHER): Payer: Medicare PPO | Admitting: Family Medicine

## 2022-07-22 VITALS — BP 119/86 | HR 99 | Ht 64.0 in | Wt 153.0 lb

## 2022-07-22 DIAGNOSIS — R0789 Other chest pain: Secondary | ICD-10-CM

## 2022-07-22 DIAGNOSIS — R202 Paresthesia of skin: Secondary | ICD-10-CM | POA: Diagnosis not present

## 2022-07-22 DIAGNOSIS — R9431 Abnormal electrocardiogram [ECG] [EKG]: Secondary | ICD-10-CM

## 2022-07-22 DIAGNOSIS — M5416 Radiculopathy, lumbar region: Secondary | ICD-10-CM

## 2022-07-22 DIAGNOSIS — M545 Low back pain, unspecified: Secondary | ICD-10-CM | POA: Diagnosis not present

## 2022-07-22 NOTE — Progress Notes (Signed)
Amber Hanson - 62 y.o. female MRN [id number]  Date of birth: Dec 09, 1960  Subjective Chief Complaint  Patient presents with   Follow-up    HPI Amber Hanson is a 62 y.o. female here today for follow up visit.   She reports that about 1 month ago she had an episode where she developed epigastric/lower chest pain, with nausea, and diaphoresis.  This lasted for several hours and then resolved.  She has not had any further episodes or other symptoms including palpitations, fatigue or dyspnea.  She did notice an area that looked like a but bite or spider bite on her upper buttock after this occurred.   She is having some numbness/tingling in her L calf area.  She has had this for a few weeks.  Denies weakness in the leg or calf.  She has not had dyspnea.  She did have  fall about 1.5-2 weeks ago.  Seen by Dr. Tamera Punt and had xrays of the L hip.  She is still having some pain in the L buttock.   ROS:  A comprehensive ROS was completed and negative except as noted per HPI  Allergies  Allergen Reactions   Other Itching, Nausea And Vomiting and Other (See Comments)    Patient states she has taken this safely before since the one episode of itching  Anti-inflammatory - affects liver function Patient states she has taken this safely before since the one episode of itching  Anti-inflammatory - affects liver function  Patient states she has taken this safely before since the one episode of itching  Anti-inflammatory - affects liver function   Red Dye Itching    Patient states she has taken this safely before since the one episode of itching     Sulfa Antibiotics Anaphylaxis   Sulfasalazine Anaphylaxis and Swelling   Ibuprofen Other (See Comments)    Affects liver functions    Past Medical History:  Diagnosis Date   Anxiety 03/08/2017   Arthritis 03/08/2017   Bipolar 1 disorder (HCC) 08/20/2015   Overview:  Dr. Chilton Si, [facility]. Now Dr Jordan Likes   Last Assessment & Plan:  Relevant Hx: Course:  Daily Update: Today's Plan:   Chronic low back pain 07/05/2011   Overview:  Dr. Karie Schwalbe. [facility]   Gastroesophageal reflux disease without esophagitis 08/20/2015   Insomnia 03/05/2013   Migraine without status migrainosus, not intractable 09/19/2012   Postmenopausal HRT (hormone replacement therapy) 08/20/2015    Past Surgical History:  Procedure Laterality Date   CESAREAN SECTION     (2)    CHOLECYSTECTOMY     COLONOSCOPY  2007   in [place]- normal exam   KNEE SURGERY Bilateral    VAGINAL HYSTERECTOMY      Social History   Socioeconomic History   Marital status: Widowed    Spouse name: Not on file   Number of children: 2   Years of education: 14   Highest education level: Associate degree: academic program  Occupational History   Occupation: [employer]    Comment: Working part time   Occupation: disabled  Tobacco Use   Smoking status: Never   Smokeless tobacco: Never  Vaping Use   Vaping Use: Never used  Substance and Sexual Activity   Alcohol use: No   Drug use: No   Sexual activity: Not Currently    Partners: Male    Birth control/protection: None  Other Topics Concern   Not on file  Social History Narrative   Lives with her dad but will  be moving soon. She enjoys gardening and painting.   Social Determinants of Health   Financial Resource Strain: Low Risk  (10/09/2021)   Overall Financial Resource Strain (CARDIA)    Difficulty of Paying Living Expenses: Not hard at all  Food Insecurity: No Food Insecurity (10/09/2021)   Hunger Vital Sign    Worried About Running Out of Food in the Last Year: Never true    Ran Out of Food in the Last Year: Never true  Transportation Needs: No Transportation Needs (10/09/2021)   PRAPARE - Administrator, Civil Service (Medical): No    Lack of Transportation (Non-Medical): No  Physical Activity: Sufficiently Active (10/09/2021)   Exercise Vital Sign    Days of Exercise per Week: 5 days    Minutes of Exercise per  Session: 60 min  Stress: No Stress Concern Present (10/09/2021)   [employer] of Occupational Health - Occupational Stress Questionnaire    Feeling of Stress : Not at all  Social Connections: Socially Isolated (10/09/2021)   Social Connection and Isolation Panel [NHANES]    Frequency of Communication with Friends and Family: More than three times a week    Frequency of Social Gatherings with Friends and Family: More than three times a week    Attends Religious Services: Never    Database administrator or Organizations: No    Attends Banker Meetings: Never    Marital Status: Widowed    Family History  Problem Relation Age of Onset   Cancer Mother        BONE   Depression Father    Diabetes Father    Depression Sister    Lung cancer Sister    Uterine cancer Sister    Depression Brother    Stomach cancer Maternal Grandmother    Stroke Other    GER disease Other    Colon cancer Neg Hx     Health Maintenance  Topic Date Due   MAMMOGRAM  06/20/2022   COVID-19 Vaccine (3 - 2023-24 season) 07/29/2022 (Originally 10/23/2021)   Zoster Vaccines- Shingrix (2 of 2) 08/18/2022 (Originally 12/18/2020)   Hepatitis C Screening  10/10/2022 (Originally 10/26/1977)   HIV Screening  10/10/2022 (Originally 10/27/1974)   Lung Cancer Screening  03/02/2023 (Originally 10/26/2009)   INFLUENZA VACCINE  09/23/2022   Medicare Annual Wellness (AWV)  10/10/2022   Colonoscopy  12/26/2030   HPV VACCINES  Aged Out   DTaP/Tdap/Td  Discontinued     ----------------------------------------------------------------------------------------------------------------------------------------------------------------------------------------------------------------- Physical Exam BP 119/86   Pulse 99   Ht 5\' 4"  (1.626 m)   Wt 153 lb (69.4 kg)   LMP  (LMP Unknown)   SpO2 96%   BMI 26.26 kg/m   Physical Exam Constitutional:      Appearance: Normal appearance.  HENT:     Head: Normocephalic and  atraumatic.  Eyes:     General: No scleral icterus. Cardiovascular:     Rate and Rhythm: Normal rate and regular rhythm.  Pulmonary:     Effort: Pulmonary effort is normal.     Breath sounds: Normal breath sounds.  Musculoskeletal:     Cervical back: Neck supple.  Neurological:     Mental Status: She is alert.  Psychiatric:        Mood and Affect: Mood normal.        Behavior: Behavior normal.    EKG: NSR.  Possible anterolateral infarct, age undetermined.   ------------------------------------------------------------------------------------------------------------------------------------------------------------------------------------------------------------------- Assessment and Plan  Lumbar radiculopathy X-rays lumbar spine ordered.  She did have a recent fall will be sure she does not have any new changes that would be contributing to pain going down her calf.  Chest discomfort She had episode of chest discomfort with nausea earlier this month.  EKG completed today which shows possible prior anterior lateral infarct.  I do not have any previous EKG to compare this to.  Referral placed to cardiology and instructed her that if she has any additional chest discomfort to seek emergency care.   No orders of the defined types were placed in this encounter.   No follow-ups on file.    This visit occurred during the SARS-CoV-2 public health emergency.  Safety protocols were in place, including screening questions prior to the visit, additional usage of staff PPE, and extensive cleaning of exam room while observing appropriate contact time as indicated for disinfecting solutions.

## 2022-07-22 NOTE — Patient Instructions (Signed)
Any further episodes of sweating/nausea chest pain, shortness of breath seek emergency care.

## 2022-07-23 ENCOUNTER — Telehealth: Payer: Self-pay | Admitting: Family Medicine

## 2022-07-23 NOTE — Telephone Encounter (Signed)
Pt called. Appointments for cardiologist is booked out to August.  She would like to be referred to another Cardiologist.

## 2022-07-25 ENCOUNTER — Encounter: Payer: Self-pay | Admitting: Family Medicine

## 2022-07-25 DIAGNOSIS — M5416 Radiculopathy, lumbar region: Secondary | ICD-10-CM | POA: Insufficient documentation

## 2022-07-25 DIAGNOSIS — R0789 Other chest pain: Secondary | ICD-10-CM | POA: Insufficient documentation

## 2022-07-25 NOTE — Assessment & Plan Note (Signed)
X-rays lumbar spine ordered.  She did have a recent fall will be sure she does not have any new changes that would be contributing to pain going down her calf.

## 2022-07-25 NOTE — Assessment & Plan Note (Signed)
She had episode of chest discomfort with nausea earlier this month.  EKG completed today which shows possible prior anterior lateral infarct.  I do not have any previous EKG to compare this to.  Referral placed to cardiology and instructed her that if she has any additional chest discomfort to seek emergency care.

## 2022-08-05 DIAGNOSIS — R9431 Abnormal electrocardiogram [ECG] [EKG]: Secondary | ICD-10-CM | POA: Diagnosis not present

## 2022-08-05 DIAGNOSIS — R079 Chest pain, unspecified: Secondary | ICD-10-CM | POA: Diagnosis not present

## 2022-08-06 DIAGNOSIS — R079 Chest pain, unspecified: Secondary | ICD-10-CM | POA: Diagnosis not present

## 2022-08-06 DIAGNOSIS — R9431 Abnormal electrocardiogram [ECG] [EKG]: Secondary | ICD-10-CM | POA: Diagnosis not present

## 2022-08-12 NOTE — Telephone Encounter (Signed)
Contacted [facility] to schedule Cardiology Referral. Patient has already been seen by Dr. Leron Croak on 08/05/2022.

## 2022-09-22 ENCOUNTER — Other Ambulatory Visit (HOSPITAL_COMMUNITY): Payer: Self-pay | Admitting: Psychiatry

## 2022-09-22 DIAGNOSIS — F319 Bipolar disorder, unspecified: Secondary | ICD-10-CM

## 2022-09-22 DIAGNOSIS — F419 Anxiety disorder, unspecified: Secondary | ICD-10-CM

## 2022-10-01 ENCOUNTER — Other Ambulatory Visit: Payer: Self-pay | Admitting: Family Medicine

## 2022-10-01 ENCOUNTER — Ambulatory Visit (INDEPENDENT_AMBULATORY_CARE_PROVIDER_SITE_OTHER): Payer: Medicare PPO | Admitting: Sports Medicine

## 2022-10-01 ENCOUNTER — Other Ambulatory Visit (INDEPENDENT_AMBULATORY_CARE_PROVIDER_SITE_OTHER): Payer: Medicare PPO

## 2022-10-01 DIAGNOSIS — M7712 Lateral epicondylitis, left elbow: Secondary | ICD-10-CM | POA: Diagnosis not present

## 2022-10-01 DIAGNOSIS — Z79890 Hormone replacement therapy (postmenopausal): Secondary | ICD-10-CM

## 2022-10-01 DIAGNOSIS — M1811 Unilateral primary osteoarthritis of first carpometacarpal joint, right hand: Secondary | ICD-10-CM | POA: Diagnosis not present

## 2022-10-01 DIAGNOSIS — M2550 Pain in unspecified joint: Secondary | ICD-10-CM | POA: Diagnosis not present

## 2022-10-01 DIAGNOSIS — Z780 Asymptomatic menopausal state: Secondary | ICD-10-CM

## 2022-10-01 MED ORDER — PREGABALIN 50 MG PO CAPS: ORAL_CAPSULE | ORAL | 6 refills | Status: DC

## 2022-10-01 NOTE — Assessment & Plan Note (Signed)
Very pleasant 62 year old female, history of left CMC arthroplasty, known right CMC osteoarthritis, last injection October 2023, recurrence of pain, right first CMC injection today, return to see me as needed.

## 2022-10-01 NOTE — Assessment & Plan Note (Signed)
Rheumatoid workup was negative. She has widespread aches and pains, some of this is likely myofascial, some due to widespread osteoarthritis. She is currently taking Celebrex, arthritis strength Tylenol ineffective, she has not taken a neuropathic agent, adding low-dose Lyrica in an up titration.

## 2022-10-01 NOTE — Assessment & Plan Note (Signed)
Several weeks pain left lateral elbow, tender over the lateral epicondyle. Adding a counterforce brace, home PT, return to see me in 6 weeks, injection if not better.

## 2022-10-01 NOTE — Progress Notes (Signed)
    Procedures performed today:    Procedure: Real-time Ultrasound Guided injection of the right first [facility] Device: Samsung HS60  Verbal informed consent obtained.  Time-out conducted.  Noted no overlying erythema, induration, or other signs of local infection.  Skin prepped in a sterile fashion.  Local anesthesia: Topical Ethyl chloride.  With sterile technique and under real time ultrasound guidance: Arthritic joint noted, 1/2 cc lidocaine, 1/2 cc kenalog 40 injected easily.   Completed without difficulty  Advised to call if fevers/chills, erythema, induration, drainage, or persistent bleeding.  Images permanently stored and available for review in PACS.  Impression: Technically successful ultrasound guided injection.  Independent interpretation of notes and tests performed by another provider:   None.  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  Lateral epicondylitis, left elbow Several weeks pain left lateral elbow, tender over the lateral epicondyle. Adding a counterforce brace, home PT, return to see me in 6 weeks, injection if not better.  Polyarthralgia Rheumatoid workup was negative. She has widespread aches and pains, some of this is likely myofascial, some due to widespread osteoarthritis. She is currently taking Celebrex, arthritis strength Tylenol ineffective, she has not taken a neuropathic agent, adding low-dose Lyrica in an up titration.    ____________________________________________ Ihor Austin. Benjamin Stain, M.D., ABFM., CAQSM., AME. Primary Care and Sports Medicine [facility]  Adjunct Professor of Family  Medicine  [employer]  Restaurant manager, fast food

## 2022-10-04 ENCOUNTER — Telehealth (HOSPITAL_COMMUNITY): Payer: Medicare PPO | Admitting: Psychiatry

## 2022-10-04 ENCOUNTER — Encounter (HOSPITAL_COMMUNITY): Payer: Self-pay

## 2022-10-11 ENCOUNTER — Ambulatory Visit (INDEPENDENT_AMBULATORY_CARE_PROVIDER_SITE_OTHER): Payer: Medicare PPO | Admitting: Family Medicine

## 2022-10-11 DIAGNOSIS — Z0000 Encounter for general adult medical examination without abnormal findings: Secondary | ICD-10-CM

## 2022-10-11 NOTE — Patient Instructions (Addendum)
MEDICARE ANNUAL WELLNESS VISIT Health Maintenance Summary and Written Plan of Care  Amber Hanson ,  Thank you for allowing me to perform your Medicare Annual Wellness Visit and for your ongoing commitment to your health.   Health Maintenance & Immunization History Health Maintenance  Topic Date Due   COVID-19 Vaccine (3 - 2023-24 season) 10/27/2022 (Originally 10/23/2021)   Zoster Vaccines- Shingrix (2 of 2) 01/11/2023 (Originally 12/18/2020)   Lung Cancer Screening  03/02/2023 (Originally 10/26/2009)   INFLUENZA VACCINE  05/23/2023 (Originally 09/23/2022)   MAMMOGRAM  10/11/2023 (Originally 06/20/2022)   Hepatitis C Screening  10/11/2023 (Originally 10/26/1977)   HIV Screening  10/11/2023 (Originally 10/27/1974)   Medicare Annual Wellness (AWV)  10/11/2023   Colonoscopy  12/26/2030   HPV VACCINES  Aged Out   DTaP/Tdap/Td  Discontinued   Immunization History  Administered Date(s) Administered   Hepatitis B 11/08/1994, 12/13/1994, 05/30/1995   Hepatitis B, PED/ADOLESCENT 11/08/1994, 12/13/1994, 05/30/1995   Influenza Split 01/13/2011   Influenza, Seasonal, Injecte, Preservative Fre 1960-07-10, 10/02/2015, 10/22/2016   Influenza,inj,Quad PF,6+ Mos 10/22/2016, 11/22/2018, 10/23/2020, 12/22/2021   Influenza,inj,quad, With Preservative 02/12/2015, 11/29/2017   Influenza-Unspecified 01/13/2011, 12/20/2011, 11/30/2013, 11/30/2013, 02/12/2015, 02/12/2015, 10/02/2015   Moderna Sars-Covid-2 Vaccination 08/18/2019, 09/14/2019   Pneumococcal Polysaccharide-23 09/19/2020   Tdap 07/05/2011, 07/05/2011   Zoster Recombinant(Shingrix) 10/23/2020    These are the patient goals that we discussed:  Goals Addressed               This Visit's Progress     Patient Stated (pt-stated)        Patient stated that she would like to loose weight.         This is a list of Health Maintenance Items that are overdue or due now: Screening mammography Shingles vaccine - 2nd dose Influenza vaccine     Orders/Referrals Placed Today: No orders of the defined types were placed in this encounter.  (Contact our referral department at [phone] if you have not spoken with someone about your referral appointment within the next 5 days)    Follow-up Plan Follow-up with Everrett Coombe, DO as planned Schedule 2nd dose of shingles vaccine. Medicare wellness visit in one year.  Patient will access AVS on my chart.      Health Maintenance, Female Adopting a healthy lifestyle and getting preventive care are important in promoting health and wellness. Ask your health care provider about: The right schedule for you to have regular tests and exams. Things you can do on your own to prevent diseases and keep yourself healthy. What should I know about diet, weight, and exercise? Eat a healthy diet  Eat a diet that includes plenty of vegetables, fruits, low-fat dairy products, and lean protein. Do not eat a lot of foods that are high in solid fats, added sugars, or sodium. Maintain a healthy weight Body mass index (BMI) is used to identify weight problems. It estimates body fat based on height and weight. Your health care provider can help determine your BMI and help you achieve or maintain a healthy weight. Get regular exercise Get regular exercise. This is one of the most important things you can do for your health. Most adults should: Exercise for at least 150 minutes each week. The exercise should increase your heart rate and make you sweat (moderate-intensity exercise). Do strengthening exercises at least twice a week. This is in addition to the moderate-intensity exercise. Spend less time sitting. Even light physical activity can be beneficial. Watch cholesterol and blood lipids Have  your blood tested for lipids and cholesterol at 63 years of age, then have this test every 5 years. Have your cholesterol levels checked more often if: Your lipid or cholesterol levels are high. You are  older than 63 years of age. You are at high risk for heart disease. What should I know about cancer screening? Depending on your health history and family history, you may need to have cancer screening at various ages. This may include screening for: Breast cancer. Cervical cancer. Colorectal cancer. Skin cancer. Lung cancer. What should I know about heart disease, diabetes, and high blood pressure? Blood pressure and heart disease High blood pressure causes heart disease and increases the risk of stroke. This is more likely to develop in people who have high blood pressure readings or are overweight. Have your blood pressure checked: Every 3-5 years if you are 46-79 years of age. Every year if you are 67 years old or older. Diabetes Have regular diabetes screenings. This checks your fasting blood sugar level. Have the screening done: Once every three years after age 67 if you are at a normal weight and have a low risk for diabetes. More often and at a younger age if you are overweight or have a high risk for diabetes. What should I know about preventing infection? Hepatitis B If you have a higher risk for hepatitis B, you should be screened for this virus. Talk with your health care provider to find out if you are at risk for hepatitis B infection. Hepatitis C Testing is recommended for: Everyone born from [birth year] through 1966. Anyone with known risk factors for hepatitis C. Sexually transmitted infections (STIs) Get screened for STIs, including gonorrhea and chlamydia, if: You are sexually active and are younger than 63 years of age. You are older than 64 years of age and your health care provider tells you that you are at risk for this type of infection. Your sexual activity has changed since you were last screened, and you are at increased risk for chlamydia or gonorrhea. Ask your health care provider if you are at risk. Ask your health care provider about whether you are at high risk  for HIV. Your health care provider may recommend a prescription medicine to help prevent HIV infection. If you choose to take medicine to prevent HIV, you should first get tested for HIV. You should then be tested every 3 months for as long as you are taking the medicine. Pregnancy If you are about to stop having your period (premenopausal) and you may become pregnant, seek counseling before you get pregnant. Take 400 to 800 micrograms (mcg) of folic acid every day if you become pregnant. Ask for birth control (contraception) if you want to prevent pregnancy. Osteoporosis and menopause Osteoporosis is a disease in which the bones lose minerals and strength with aging. This can result in bone fractures. If you are 63 years old or older, or if you are at risk for osteoporosis and fractures, ask your health care provider if you should: Be screened for bone loss. Take a calcium or vitamin D supplement to lower your risk of fractures. Be given hormone replacement therapy (HRT) to treat symptoms of menopause. Follow these instructions at home: Alcohol use Do not drink alcohol if: Your health care provider tells you not to drink. You are pregnant, may be pregnant, or are planning to become pregnant. If you drink alcohol: Limit how much you have to: 0-1 drink a day. Know how much alcohol is  in your drink. In the [place] one drink equals one 12 oz bottle of beer (355 mL), one 5 oz glass of wine (148 mL), or one 1 oz glass of hard liquor (44 mL). Lifestyle Do not use any products that contain nicotine or tobacco. These products include cigarettes, chewing tobacco, and vaping devices, such as e-cigarettes. If you need help quitting, ask your health care provider. Do not use street drugs. Do not share needles. Ask your health care provider for help if you need support or information about quitting drugs. General instructions Schedule regular health, dental, and eye exams. Stay current with your  vaccines. Tell your health care provider if: You often feel depressed. You have ever been abused or do not feel safe at home. Summary Adopting a healthy lifestyle and getting preventive care are important in promoting health and wellness. Follow your health care provider's instructions about healthy diet, exercising, and getting tested or screened for diseases. Follow your health care provider's instructions on monitoring your cholesterol and blood pressure. This information is not intended to replace advice given to you by your health care provider. Make sure you discuss any questions you have with your health care provider. Document Revised: 06/30/2020 Document Reviewed: 06/30/2020 Elsevier Patient Education  2024 [employer].

## 2022-10-11 NOTE — Progress Notes (Signed)
MEDICARE ANNUAL WELLNESS VISIT  10/11/2022  Telephone Visit Disclaimer This Medicare AWV was conducted by telephone due to national recommendations for restrictions regarding the COVID-19 Pandemic (e.g. social distancing).  I verified, using two identifiers, that I am speaking with Amber Hanson or their authorized healthcare agent. I discussed the limitations, risks, security, and privacy concerns of performing an evaluation and management service by telephone and the potential availability of an in-person appointment in the future. The patient expressed understanding and agreed to proceed.  Location of Patient: Home Location of Provider (nurse):  In the office.  Subjective:    Amber Hanson is a 62 y.o. female patient of Everrett Coombe, DO who had a Medicare Annual Wellness Visit today via telephone. Amber Hanson is Disabled and lives with an adult companion. she has 2 children. she reports that she is socially active and does interact with friends/family regularly. she is moderately physically active and enjoys gardening and painting.  Patient Care Team: Everrett Coombe, DO as PCP - General (Family Medicine)     10/11/2022    3:27 PM 10/09/2021    1:48 PM 02/18/2020    2:18 PM 08/05/2017    9:41 AM  Advanced Directives  Does Patient Have a Medical Advance Directive? No No No No  Would patient like information on creating a medical advance directive? No - Patient declined No - Patient declined No - Patient declined No - Patient declined    Hospital Utilization Over the Past 12 Months: # of hospitalizations or ER visits: 0 # of surgeries: 0  Review of Systems    Patient reports that her overall health is better compared to last year.  History obtained from chart review and the patient  Patient Reported Readings (BP, Pulse, CBG, Weight, etc)  Per patient no change in vitals since last visit, unable to obtain new vitals due to telehealth visit and lack of equipment.  Pain  Assessment Pain : No/denies pain     Current Medications & Allergies (verified) Allergies as of 10/11/2022       Reactions   Other Itching, Nausea And Vomiting, Other (See Comments)   Patient states she has taken this safely before since the one episode of itching  Anti-inflammatory - affects liver function Patient states she has taken this safely before since the one episode of itching  Anti-inflammatory - affects liver function Patient states she has taken this safely before since the one episode of itching  Anti-inflammatory - affects liver function   Red Dye #40 (allura Red) Itching   Patient states she has taken this safely before since the one episode of itching    Sulfa Antibiotics Anaphylaxis   Sulfasalazine Anaphylaxis, Swelling   Ibuprofen Other (See Comments)   Affects liver functions        Medication List        Accurate as of October 11, 2022  3:46 PM. If you have any questions, ask your nurse or doctor.          acetaminophen 650 MG CR tablet Commonly known as: TYLENOL Take 1 tablet (650 mg total) by mouth every 8 (eight) hours as needed for pain.   Azelaic Acid 15 % gel Apply 1 Application topically 2 (two) times daily.   busPIRone 15 MG tablet Commonly known as: BUSPAR TAKE 1 TABLET BY MOUTH TWICE DAILY   celecoxib 200 MG capsule Commonly known as: CELEBREX TAKE 1 CAPSULE BY MOUTH TWICE A DAY   cetirizine 10 MG tablet Commonly known  as: ZYRTEC Take by mouth. As needed   diclofenac Sodium 1 % Gel Commonly known as: Voltaren Apply 2 g topically 4 (four) times daily.   DULoxetine 60 MG capsule Commonly known as: CYMBALTA TAKE 2 CAPSULES BY MOUTH EVERY DAY   erythromycin ophthalmic ointment Apply 1g (pea sized amount) to eyelids three times daily for 10 days.   estradiol 1 MG tablet Commonly known as: ESTRACE TAKE 1 TABLET BY MOUTH EVERY DAY   ketoconazole 2 % cream Commonly known as: NIZORAL Apply 1 application topically 2 (two)  times daily. To affected areas.   methylPREDNISolone 4 MG Tbpk tablet Commonly known as: MEDROL DOSEPAK 6-day pack as directed   pantoprazole 20 MG tablet Commonly known as: PROTONIX TAKE 1 TABLET BY MOUTH EVERY DAY   pregabalin 50 MG capsule Commonly known as: Lyrica 1 capsule p.o. nightly for a week then twice daily for a week then 2 capsules p.o. twice daily   Premarin vaginal cream Generic drug: conjugated estrogens Place 1 Applicatorful vaginally daily. Use daily x2 weeks then reduce to three times per week.   propranolol 20 MG tablet Commonly known as: INDERAL TAKE 1 TABLET TWICE DAILY   QUEtiapine 100 MG tablet Commonly known as: SEROQUEL Take at night   valACYclovir 1000 MG tablet Commonly known as: VALTREX Take 1 tablet (1,000 mg total) by mouth 3 (three) times daily.        History (reviewed): Past Medical History:  Diagnosis Date   Anxiety 03/08/2017   Arthritis 03/08/2017   Bipolar 1 disorder (HCC) 08/20/2015   Overview:  Dr. Chilton Si, [facility]. Now Dr Jordan Likes   Last Assessment & Plan:  Relevant Hx: Course: Daily Update: Today's Plan:   Chronic low back pain 07/05/2011   Overview:  Dr. Karie Schwalbe. [facility]   Gastroesophageal reflux disease without esophagitis 08/20/2015   Insomnia 03/05/2013   Migraine without status migrainosus, not intractable 09/19/2012   Postmenopausal HRT (hormone replacement therapy) 08/20/2015   Past Surgical History:  Procedure Laterality Date   CESAREAN SECTION     (2)    CHOLECYSTECTOMY     COLONOSCOPY  2007   in [place]- normal exam   KNEE SURGERY Bilateral    VAGINAL HYSTERECTOMY     Family History  Problem Relation Age of Onset   Cancer Mother        BONE   Depression Father    Diabetes Father    Depression Sister    Lung cancer Sister    Uterine cancer Sister    Depression Brother    Stomach cancer Maternal Grandmother    Stroke Other    GER disease Other    Colon cancer Neg Hx    Social History    Socioeconomic History   Marital status: Widowed    Spouse name: Not on file   Number of children: 2   Years of education: 14   Highest education level: Associate degree: academic program  Occupational History   Occupation: [employer]    Comment: Working part time   Occupation: disabled  Tobacco Use   Smoking status: Never   Smokeless tobacco: Never  Vaping Use   Vaping status: Never Used  Substance and Sexual Activity   Alcohol use: No   Drug use: No   Sexual activity: Not Currently    Partners: Male    Birth control/protection: None  Other Topics Concern   Not on file  Social History Narrative   Lives with a friend. She enjoys gardening and  painting.   Social Determinants of Health   Financial Resource Strain: Low Risk  (10/11/2022)   Overall Financial Resource Strain (CARDIA)    Difficulty of Paying Living Expenses: Not hard at all  Food Insecurity: No Food Insecurity (10/11/2022)   Hunger Vital Sign    Worried About Running Out of Food in the Last Year: Never true    Ran Out of Food in the Last Year: Never true  Transportation Needs: No Transportation Needs (10/11/2022)   PRAPARE - Administrator, Civil Service (Medical): No    Lack of Transportation (Non-Medical): No  Physical Activity: Sufficiently Active (10/11/2022)   Exercise Vital Sign    Days of Exercise per Week: 3 days    Minutes of Exercise per Session: 60 min  Stress: Stress Concern Present (10/11/2022)   [employer] of Occupational Health - Occupational Stress Questionnaire    Feeling of Stress : To some extent  Social Connections: Unknown (10/11/2022)   Social Connection and Isolation Panel [NHANES]    Frequency of Communication with Friends and Family: Three times a week    Frequency of Social Gatherings with Friends and Family: Once a week    Attends Religious Services: Never    Database administrator or Organizations: No    Attends Banker Meetings: Never    Marital Status:  Not on file    Activities of Daily Living    10/11/2022    3:29 PM  In your present state of health, do you have any difficulty performing the following activities:  Hearing? 0  Vision? 0  Difficulty concentrating or making decisions? 1  Comment sometimes  Walking or climbing stairs? 0  Dressing or bathing? 0  Doing errands, shopping? 0  Preparing Food and eating ? N  Using the Toilet? N  In the past six months, have you accidently leaked urine? Y  Do you have problems with loss of bowel control? N  Managing your Medications? N  Managing your Finances? N  Housekeeping or managing your Housekeeping? N    Patient Education/ Literacy How often do you need to have someone help you when you read instructions, pamphlets, or other written materials from your doctor or pharmacy?: 1 - Never  Exercise    Diet Patient reports consuming 2 meals a day and 1 snack(s) a day Patient reports that her primary diet is: Regular Patient reports that she does have regular access to food.   Depression Screen    10/11/2022    3:28 PM 07/13/2022    2:05 PM 10/09/2021    1:42 PM 05/01/2021   11:17 AM 03/04/2021    2:17 PM 02/18/2020    2:24 PM 03/08/2017    9:01 AM  PHQ 2/9 Scores  PHQ - 2 Score 1 2 0  2 6 3   PHQ- 9 Score   0  14 13 9      Information is confidential and restricted. Go to Review Flowsheets to unlock data.     Fall Risk    10/11/2022    3:28 PM 07/13/2022    2:05 PM 04/29/2022    2:54 PM 10/09/2021    1:42 PM 02/18/2020    2:24 PM  Fall Risk   Falls in the past year? 1 1 0 1 0  Number falls in past yr: 0 1 0 1 0  Injury with Fall? 0 1 0 1 0  Risk for fall due to : History of fall(s) History of fall(s)  No Fall Risks History of fall(s);Impaired balance/gait No Fall Risks  Follow up Falls evaluation completed;Education provided;Falls prevention discussed Falls evaluation completed Falls evaluation completed Falls evaluation completed;Education provided;Falls prevention  discussed Falls evaluation completed     Objective:  Amber Hanson seemed alert and oriented and she participated appropriately during our telephone visit.  Blood Pressure Weight BMI  BP Readings from Last 3 Encounters:  07/22/22 119/86  07/13/22 (!) 123/92  06/28/22 (!) 153/97   Wt Readings from Last 3 Encounters:  07/22/22 153 lb (69.4 kg)  07/13/22 154 lb 4 oz (70 kg)  06/28/22 157 lb (71.2 kg)   BMI Readings from Last 1 Encounters:  07/22/22 26.26 kg/m    *Unable to obtain current vital signs, weight, and BMI due to telephone visit type  Hearing/Vision  Amber Hanson did not seem to have difficulty with hearing/understanding during the telephone conversation Reports that she has not had a formal eye exam by an eye care professional within the past year Reports that she has not had a formal hearing evaluation within the past year *Unable to fully assess hearing and vision during telephone visit type  Cognitive Function:    10/11/2022    3:35 PM 10/09/2021    1:49 PM 02/18/2020    2:22 PM  6CIT Screen  What Year? 0 points 0 points 0 points  What month? 0 points 0 points 0 points  What time? 0 points 0 points 0 points  Count back from 20 0 points 0 points 0 points  Months in reverse 0 points 0 points 0 points  Repeat phrase 0 points 0 points 0 points  Total Score 0 points 0 points 0 points   (Normal:0-7, Significant for Dysfunction: >8)  Normal Cognitive Function Screening: Yes   Immunization & Health Maintenance Record Immunization History  Administered Date(s) Administered   Hepatitis B 11/08/1994, 12/13/1994, 05/30/1995   Hepatitis B, PED/ADOLESCENT 11/08/1994, 12/13/1994, 05/30/1995   Influenza Split 01/13/2011   Influenza, Seasonal, Injecte, Preservative Fre Nov 17, 1960, 10/02/2015, 10/22/2016   Influenza,inj,Quad PF,6+ Mos 10/22/2016, 11/22/2018, 10/23/2020, 12/22/2021   Influenza,inj,quad, With Preservative 02/12/2015, 11/29/2017   Influenza-Unspecified 01/13/2011,  12/20/2011, 11/30/2013, 11/30/2013, 02/12/2015, 02/12/2015, 10/02/2015   Moderna Sars-Covid-2 Vaccination 08/18/2019, 09/14/2019   Pneumococcal Polysaccharide-23 09/19/2020   Tdap 07/05/2011, 07/05/2011   Zoster Recombinant(Shingrix) 10/23/2020    Health Maintenance  Topic Date Due   COVID-19 Vaccine (3 - 2023-24 season) 10/27/2022 (Originally 10/23/2021)   Zoster Vaccines- Shingrix (2 of 2) 01/11/2023 (Originally 12/18/2020)   Lung Cancer Screening  03/02/2023 (Originally 10/26/2009)   INFLUENZA VACCINE  05/23/2023 (Originally 09/23/2022)   MAMMOGRAM  10/11/2023 (Originally 06/20/2022)   Hepatitis C Screening  10/11/2023 (Originally 10/26/1977)   HIV Screening  10/11/2023 (Originally 10/27/1974)   Medicare Annual Wellness (AWV)  10/11/2023   Colonoscopy  12/26/2030   HPV VACCINES  Aged Out   DTaP/Tdap/Td  Discontinued       Assessment  This is a routine wellness examination for Amber Hanson.  Health Maintenance: Due or Overdue There are no preventive care reminders to display for this patient.   Amber Hanson does not need a referral for Community Assistance: Care Management:   no Social Work:    no Prescription Assistance:  no Nutrition/Diabetes Education:  no   Plan:  Personalized Goals  Goals Addressed               This Visit's Progress     Patient Stated (pt-stated)        Patient stated that she would  like to loose weight.       Personalized Health Maintenance & Screening Recommendations  Screening mammography Shingles vaccine - 2nd dose Influenza vaccine  Lung Cancer Screening Recommended: no (Low Dose CT Chest recommended if Age 2-80 years, 20 pack-year currently smoking OR have quit w/in past 15 years) Hepatitis C Screening recommended: yes HIV Screening recommended: yes  Advanced Directives: Written information was not prepared per patient's request.  Referrals & Orders No orders of the defined types were placed in this encounter.   Follow-up  Plan Follow-up with Everrett Coombe, DO as planned Schedule 2nd dose of shingles vaccine. Medicare wellness visit in one year.  Patient will access AVS on my chart.   I have personally reviewed and noted the following in the patient's chart:   Medical and social history Use of alcohol, tobacco or illicit drugs  Current medications and supplements Functional ability and status Nutritional status Physical activity Advanced directives List of other physicians Hospitalizations, surgeries, and ER visits in previous 12 months Vitals Screenings to include cognitive, depression, and falls Referrals and appointments  In addition, I have reviewed and discussed with Amber Hanson certain preventive protocols, quality metrics, and best practice recommendations. A written personalized care plan for preventive services as well as general preventive health recommendations is available and can be mailed to the patient at her request.      Modesto Charon, RN BSN  10/11/2022

## 2022-10-13 ENCOUNTER — Telehealth: Payer: Self-pay | Admitting: Family Medicine

## 2022-10-13 NOTE — Telephone Encounter (Signed)
Pharmacy called inquiring about Premarin vaginal cream they're asking for something cheaper please advise  [facility] [phone]

## 2022-10-14 NOTE — Telephone Encounter (Signed)
Our system is showing that this is preferred.  Alternative, estrace cream is at the same tier as premarin. How much is this costing her?

## 2022-10-15 MED ORDER — ESTRADIOL 0.1 MG/GM VA CREA: 1.0000 | TOPICAL_CREAM | VAGINAL | 12 refills | Status: AC

## 2022-10-15 NOTE — Addendum Note (Signed)
Addended by: Mammie Lorenzo on: 10/15/2022 12:18 PM   Modules accepted: Orders

## 2022-10-15 NOTE — Telephone Encounter (Signed)
Estrace sent in to replace premarin.

## 2022-10-15 NOTE — Telephone Encounter (Signed)
Attempted call to patient. Left a voice mail message requesting a return call.  

## 2022-10-19 NOTE — Telephone Encounter (Signed)
Patient informed and has picked up the medication.

## 2022-10-28 ENCOUNTER — Other Ambulatory Visit (HOSPITAL_COMMUNITY): Payer: Self-pay | Admitting: *Deleted

## 2022-10-28 ENCOUNTER — Telehealth (HOSPITAL_COMMUNITY): Payer: Self-pay | Admitting: *Deleted

## 2022-10-28 DIAGNOSIS — F319 Bipolar disorder, unspecified: Secondary | ICD-10-CM

## 2022-10-28 DIAGNOSIS — F419 Anxiety disorder, unspecified: Secondary | ICD-10-CM

## 2022-10-28 MED ORDER — BUSPIRONE HCL 15 MG PO TABS
ORAL_TABLET | ORAL | 0 refills | Status: DC
Start: 2022-10-28 — End: 2023-01-26

## 2022-10-28 NOTE — Telephone Encounter (Signed)
CALLED TO INFORM PATIENT  PER PROVIDER-- sent one month meds.  No refill after that. Need be seen within a month

## 2022-10-28 NOTE — Telephone Encounter (Signed)
sent one month meds. Co-Sign

## 2022-10-28 NOTE — Telephone Encounter (Signed)
Rx REFILL  REQUEST-- busPIRone (BUSPAR) 15 MG tablet   [id number] [place], [place] - [address]   NO SHOW 10/04/22 LAST APPT 07/02/22

## 2022-11-08 ENCOUNTER — Telehealth (HOSPITAL_COMMUNITY): Payer: Self-pay | Admitting: *Deleted

## 2022-11-08 MED ORDER — QUETIAPINE FUMARATE 100 MG PO TABS: ORAL_TABLET | ORAL | 0 refills | Status: DC

## 2022-11-08 NOTE — Telephone Encounter (Signed)
Rx REFILL REQUEST-- [id number] - [name], [name] - [address]   QUEtiapine (SEROQUEL) 100 MG tablet Medication Date: 07/14/2022 Department: [facility] Ordering/Authorizing: Thresa Ross, MD   Order Providers  Prescribing Provider Encounter Provider  Thresa Ross, MD Wilder Glade, RN   Outpatient Medication Detail   Disp Refills Start End   QUEtiapine (SEROQUEL) 100 MG tablet 90 tablet 0 07/14/2022 --   Sig: Take at night     NEXT APPT -- 11/10/22 LAST APPT -- 07/02/22

## 2022-11-08 NOTE — Addendum Note (Signed)
Addended by: Thresa Ross on: 11/08/2022 09:47 AM   Modules accepted: Orders

## 2022-11-10 ENCOUNTER — Encounter (HOSPITAL_COMMUNITY): Payer: Self-pay | Admitting: Psychiatry

## 2022-11-10 ENCOUNTER — Telehealth (INDEPENDENT_AMBULATORY_CARE_PROVIDER_SITE_OTHER): Payer: Medicare PPO | Admitting: Psychiatry

## 2022-11-10 DIAGNOSIS — F419 Anxiety disorder, unspecified: Secondary | ICD-10-CM

## 2022-11-10 DIAGNOSIS — F319 Bipolar disorder, unspecified: Secondary | ICD-10-CM | POA: Diagnosis not present

## 2022-11-10 DIAGNOSIS — F5102 Adjustment insomnia: Secondary | ICD-10-CM

## 2022-11-10 DIAGNOSIS — F411 Generalized anxiety disorder: Secondary | ICD-10-CM

## 2022-11-10 DIAGNOSIS — R4589 Other symptoms and signs involving emotional state: Secondary | ICD-10-CM

## 2022-11-10 NOTE — Progress Notes (Signed)
[facility] follow up visit  Patient Identification: Amber Hanson MRN:  [id number] Date of Evaluation:  11/10/2022 Referral Source: primary care Chief Complaint:   No chief complaint on file. Follow up bipolar Visit Diagnosis:    ICD-10-CM   1. Bipolar 1 disorder (HCC)  F31.9     2. GAD (generalized anxiety disorder)  F41.1     3. Adjustment insomnia  F51.02     4. Anxiety  F41.9     Virtual Visit via Video Note  I connected with Amber Hanson on 11/10/22 at 10:30 AM EDT by a video enabled telemedicine application and verified that I am speaking with the correct person using two identifiers.  Location: Patient: home Provider: home office   I discussed the limitations of evaluation and management by telemedicine and the availability of in person appointments. The patient expressed understanding and agreed to proceed.     I discussed the assessment and treatment plan with the patient. The patient was provided an opportunity to ask questions and all were answered. The patient agreed with the plan and demonstrated an understanding of the instructions.   The patient was advised to call back or seek an in-person evaluation if the symptoms worsen or if the condition fails to improve as anticipated.  I provided 20 minutes of non-face-to-face time during this encounter.    History of Present Illness: Patient is a 62  years old currently widowed Caucasian femaleinitially  referred by primary care to establish care for bipolar disorder and anxiety.  She lives by herself has 2 grown kids  History of hospital admission with OD at [facility]  Being a widow has been challenging Overall doing fair with meds and depression is manageable with cymbalta  On gaba for pain as well  Seroquel helps sleep but still wakes up at times    Does not endorse psychotic symptoms  Denies current use of drugs or alcohol although chart suggest history of alcohol intoxication in the past  Aggravating factor :  widow  Modifying factors some friends, dogs    Severity ; better Duration more so related for last 2 years after being a widow   Past Psychiatric History: Bipolar, anxiety, grief  Previous Psychotropic Medications: Yes   Substance Abuse History in the last 12 months:  No.  Consequences of Substance Abuse: NA  Past Medical History:  Past Medical History:  Diagnosis Date   Anxiety 03/08/2017   Arthritis 03/08/2017   Bipolar 1 disorder (HCC) 08/20/2015   Overview:  Dr. Chilton Si, [facility]. Now Dr Jordan Likes   Last Assessment & Plan:  Relevant Hx: Course: Daily Update: Today's Plan:   Chronic low back pain 07/05/2011   Overview:  Dr. Karie Schwalbe. [facility], [facility]   Gastroesophageal reflux disease without esophagitis 08/20/2015   Insomnia 03/05/2013   Migraine without status migrainosus, not intractable 09/19/2012   Postmenopausal HRT (hormone replacement therapy) 08/20/2015    Past Surgical History:  Procedure Laterality Date   CESAREAN SECTION     (2)    CHOLECYSTECTOMY     COLONOSCOPY  2007   in [place]- normal exam   KNEE SURGERY Bilateral    VAGINAL HYSTERECTOMY      Family Psychiatric History: Dad: depression  Family History:  Family History  Problem Relation Age of Onset   Cancer Mother        BONE   Depression Father    Diabetes Father    Depression Sister    Lung cancer Sister    Uterine cancer Sister  Depression Brother    Stomach cancer Maternal Grandmother    Stroke Other    GER disease Other    Colon cancer Neg Hx     Social History:   Social History   Socioeconomic History   Marital status: Widowed    Spouse name: Not on file   Number of children: 2   Years of education: 14   Highest education level: Associate degree: academic program  Occupational History   Occupation: [employer]    Comment: Working part time   Occupation: disabled  Tobacco Use   Smoking status: Never   Smokeless tobacco: Never  Vaping Use   Vaping status: Never Used   Substance and Sexual Activity   Alcohol use: No   Drug use: No   Sexual activity: Not Currently    Partners: Male    Birth control/protection: None  Other Topics Concern   Not on file  Social History Narrative   Lives with a friend. She enjoys gardening and painting.   Social Determinants of Health   Financial Resource Strain: Low Risk  (10/11/2022)   Overall Financial Resource Strain (CARDIA)    Difficulty of Paying Living Expenses: Not hard at all  Food Insecurity: No Food Insecurity (10/11/2022)   Hunger Vital Sign    Worried About Running Out of Food in the Last Year: Never true    Ran Out of Food in the Last Year: Never true  Transportation Needs: No Transportation Needs (10/11/2022)   PRAPARE - Administrator, Civil Service (Medical): No    Lack of Transportation (Non-Medical): No  Physical Activity: Sufficiently Active (10/11/2022)   Exercise Vital Sign    Days of Exercise per Week: 3 days    Minutes of Exercise per Session: 60 min  Stress: Stress Concern Present (10/11/2022)   [employer] of Occupational Health - Occupational Stress Questionnaire    Feeling of Stress : To some extent  Social Connections: Unknown (10/11/2022)   Social Connection and Isolation Panel [NHANES]    Frequency of Communication with Friends and Family: Three times a week    Frequency of Social Gatherings with Friends and Family: Once a week    Attends Religious Services: Never    Database administrator or Organizations: No    Attends Engineer, structural: Never    Marital Status: Not on file    Additional Social History: grew up with mom, parents were divorced, felt she was neglected and endorses bad memories from childhood  Allergies:   Allergies  Allergen Reactions   Other Itching, Nausea And Vomiting and Other (See Comments)    Patient states she has taken this safely before since the one episode of itching  Anti-inflammatory - affects liver function Patient  states she has taken this safely before since the one episode of itching  Anti-inflammatory - affects liver function  Patient states she has taken this safely before since the one episode of itching  Anti-inflammatory - affects liver function   Red Dye #40 (Allura Red) Itching    Patient states she has taken this safely before since the one episode of itching     Sulfa Antibiotics Anaphylaxis   Sulfasalazine Anaphylaxis and Swelling   Ibuprofen Other (See Comments)    Affects liver functions    Metabolic Disorder Labs: No results found for: "HGBA1C", "MPG" No results found for: "PROLACTIN" No results found for: "CHOL", "TRIG", "HDL", "CHOLHDL", "VLDL", "LDLCALC" No results found for: "TSH"  Therapeutic Level Labs:  No results found for: "LITHIUM" No results found for: "CBMZ" No results found for: "VALPROATE"  Current Medications: Current Outpatient Medications  Medication Sig Dispense Refill   acetaminophen (TYLENOL) 650 MG CR tablet Take 1 tablet (650 mg total) by mouth every 8 (eight) hours as needed for pain. 90 tablet 3   Azelaic Acid 15 % gel Apply 1 Application topically 2 (two) times daily. 30 g 0   busPIRone (BUSPAR) 15 MG tablet TAKE 1 TABLET BY MOUTH TWICE DAILY 180 tablet 0   celecoxib (CELEBREX) 200 MG capsule TAKE 1 CAPSULE BY MOUTH TWICE A DAY 180 capsule 0   cetirizine (ZYRTEC) 10 MG tablet Take by mouth. As needed     diclofenac Sodium (VOLTAREN) 1 % GEL Apply 2 g topically 4 (four) times daily. 100 g 3   DULoxetine (CYMBALTA) 60 MG capsule TAKE 2 CAPSULES BY MOUTH EVERY DAY 180 capsule 0   erythromycin ophthalmic ointment Apply 1g (pea sized amount) to eyelids three times daily for 10 days. 30 g 0   estradiol (ESTRACE VAGINAL) 0.1 MG/GM vaginal cream Place 1 Applicatorful vaginally 3 (three) times a week. 42.5 g 12   estradiol (ESTRACE) 1 MG tablet TAKE 1 TABLET BY MOUTH EVERY DAY 90 tablet 0   ketoconazole (NIZORAL) 2 % cream Apply 1 application topically 2  (two) times daily. To affected areas. 30 g 1   methylPREDNISolone (MEDROL DOSEPAK) 4 MG TBPK tablet 6-day pack as directed 21 tablet 0   pantoprazole (PROTONIX) 20 MG tablet TAKE 1 TABLET BY MOUTH EVERY DAY 90 tablet 1   pregabalin (LYRICA) 50 MG capsule 1 capsule p.o. nightly for a week then twice daily for a week then 2 capsules p.o. twice daily 60 capsule 6   propranolol (INDERAL) 20 MG tablet TAKE 1 TABLET TWICE DAILY 180 tablet 1   QUEtiapine (SEROQUEL) 100 MG tablet Take at night 90 tablet 0   valACYclovir (VALTREX) 1000 MG tablet Take 1 tablet (1,000 mg total) by mouth 3 (three) times daily. 21 tablet 0   Current Facility-Administered Medications  Medication Dose Route Frequency Provider Last Rate Last Admin   0.9 %  sodium chloride infusion  500 mL Intravenous Once Sherrilyn Rist, MD         Psychiatric Specialty Exam: Review of Systems  Cardiovascular:  Negative for chest pain.  Neurological:  Negative for tremors.  Psychiatric/Behavioral:  Negative for agitation, hallucinations and self-injury.     There were no vitals taken for this visit.There is no height or weight on file to calculate BMI.  General Appearance: Casual  Eye Contact:  Fair  Speech:  Normal Rate  Volume:  Decreased  Mood: fair  Affect:  Congruent  Thought Process:  Goal Directed  Orientation:  Full (Time, Place, and Person)  Thought Content:  Rumination  Suicidal Thoughts:  No  Homicidal Thoughts:  No  Memory:  Immediate;   Fair  Judgement:  Fair  Insight:  Shallow  Psychomotor Activity:  Decreased  Concentration:  Concentration: Fair  Recall:  Fair  Fund of Knowledge:Good  Language: Good  Akathisia:  No  Handed:    AIMS (if indicated):  no involuntary movements  Assets:  Desire for Improvement Housing  ADL's:  Intact  Cognition: WNL  Sleep:  Fair   Screenings: GAD-7    Flowsheet Row Office Visit from 03/04/2021 in [facility] Primary Care & Sports Medicine at [facility]   Total GAD-7 Score 12      PHQ2-9  Flowsheet Row Office Visit from 10/11/2022 in [facility] Primary Care & Sports Medicine at [facility] Office Visit from 07/13/2022 in [facility] Primary Care & Sports Medicine at [facility] Clinical Support from 10/09/2021 in [facility] Primary Care & Sports Medicine at [facility] Office Visit from 05/01/2021 in [facility] Outpatient Behavioral Health at [facility] Office Visit from 03/04/2021 in [facility] Primary Care & Sports Medicine at [facility] Total Score 1 2 0 2 2  PHQ-9 Total Score -- -- 0 7 14      Flowsheet Row Office Visit from 08/13/2021 in [facility] Behavioral Health at [facility] Office Visit from 06/16/2021 in [facility] Outpatient Behavioral Health at [facility] Office Visit from 05/01/2021 in [facility] Out[facility] at [facility]  C-SSRS RISK CATEGORY No Risk No Risk No Risk       Assessment and Plan: as follows  Prior documentation reviewed   Bipolar disorder current episode depressed; manageable continue cymbalta, seroquel. No tremors   Generalized anxiety disorder: manageable continue cymbalta Lonliness: handling it better, has a friend who lives with her now Insomnia; doing fair with seroquel and also on gaba that has helped with sleep No tremors Fu 74m. Refills if due were sent  Thresa Ross, MD 9/18/202410:57 AM

## 2022-11-12 ENCOUNTER — Ambulatory Visit: Payer: Medicare PPO | Admitting: Sports Medicine

## 2022-11-26 ENCOUNTER — Ambulatory Visit (INDEPENDENT_AMBULATORY_CARE_PROVIDER_SITE_OTHER): Payer: Medicare PPO | Admitting: Sports Medicine

## 2022-11-26 ENCOUNTER — Other Ambulatory Visit (INDEPENDENT_AMBULATORY_CARE_PROVIDER_SITE_OTHER): Payer: Medicare PPO

## 2022-11-26 ENCOUNTER — Ambulatory Visit: Payer: Medicare PPO

## 2022-11-26 DIAGNOSIS — M25552 Pain in left hip: Secondary | ICD-10-CM | POA: Diagnosis not present

## 2022-11-26 DIAGNOSIS — M7062 Trochanteric bursitis, left hip: Secondary | ICD-10-CM

## 2022-11-26 DIAGNOSIS — M47816 Spondylosis without myelopathy or radiculopathy, lumbar region: Secondary | ICD-10-CM | POA: Diagnosis not present

## 2022-11-26 NOTE — Progress Notes (Signed)
    Procedures performed today:    Procedure: Real-time Ultrasound Guided injection of the left greater trochanteric bursa Device: Samsung HS60  Verbal informed consent obtained.  Time-out conducted.  Noted no overlying erythema, induration, or other signs of local infection.  Skin prepped in a sterile fashion.  Local anesthesia: Topical Ethyl chloride.  With sterile technique and under real time ultrasound guidance: I advanced a 22-gauge spinal needle superficial to and deep to the hip abductor tendons at the greater trochanter, I injected 1 cc Kenalog 40, 2 cc lidocaine, 2 cc bupivacaine injected easily, the medication was split between superficial and deep to the abductor tendons. Completed without difficulty  Advised to call if fevers/chills, erythema, induration, drainage, or persistent bleeding.  Images permanently stored and available for review in PACS.  Impression: Technically successful ultrasound guided injection.  Independent interpretation of notes and tests performed by another provider:   None.  Brief History, Exam, Impression, and Recommendations:    Greater trochanteric bursitis of left hip Very pleasant 62 year old female, we treated her for greater trochanteric bursitis/hip abductor tendinopathy in early 2019, she did really well with an injection, now having recurrence of pain lateral hip worse with resisted abduction, painful laying on the ipsilateral side, patient requesting interventional treatment today, we did a greater trochanteric bursa injection, hip abductor physical therapy given, I would like updated x-rays, return to see me 6 weeks as needed.    ____________________________________________ Ihor Austin. Benjamin Stain, M.D., ABFM., CAQSM., AME. Primary Care and Sports Medicine [facility]  Adjunct Professor of Family Medicine  [employer]  Restaurant manager, fast food

## 2022-11-26 NOTE — Assessment & Plan Note (Signed)
Very pleasant 62 year old female, we treated her for greater trochanteric bursitis/hip abductor tendinopathy in early 2019, she did really well with an injection, now having recurrence of pain lateral hip worse with resisted abduction, painful laying on the ipsilateral side, patient requesting interventional treatment today, we did a greater trochanteric bursa injection, hip abductor physical therapy given, I would like updated x-rays, return to see me 6 weeks as needed.

## 2022-11-29 DIAGNOSIS — M7062 Trochanteric bursitis, left hip: Secondary | ICD-10-CM | POA: Diagnosis not present

## 2022-11-29 MED ORDER — TRIAMCINOLONE ACETONIDE 40 MG/ML IJ SUSP
40.0000 mg | Freq: Once | INTRAMUSCULAR | Status: AC
Start: 2022-11-29 — End: 2022-11-29
  Administered 2022-11-29: 40 mg via INTRAMUSCULAR

## 2022-11-29 NOTE — Addendum Note (Signed)
Addended by: Carren Rang A on: 11/29/2022 09:24 AM   Modules accepted: Orders

## 2022-12-02 ENCOUNTER — Telehealth: Payer: Self-pay | Admitting: Family Medicine

## 2022-12-02 NOTE — Telephone Encounter (Signed)
Patient called stated she has a lot of congestion , nose is running, she is requesting something be called into her pharmacy [facility] in [place]  [phone]

## 2022-12-02 NOTE — Telephone Encounter (Signed)
I left a message advising patient to call back to schedule an appointment.

## 2022-12-03 ENCOUNTER — Encounter: Payer: Self-pay | Admitting: Family Medicine

## 2022-12-03 ENCOUNTER — Ambulatory Visit (INDEPENDENT_AMBULATORY_CARE_PROVIDER_SITE_OTHER): Payer: Medicare PPO | Admitting: Family Medicine

## 2022-12-03 VITALS — BP 139/85 | HR 80 | Ht 64.0 in | Wt 153.0 lb

## 2022-12-03 DIAGNOSIS — J069 Acute upper respiratory infection, unspecified: Secondary | ICD-10-CM | POA: Diagnosis not present

## 2022-12-03 DIAGNOSIS — J302 Other seasonal allergic rhinitis: Secondary | ICD-10-CM

## 2022-12-03 MED ORDER — PREDNISONE 50 MG PO TABS: ORAL_TABLET | ORAL | 0 refills | Status: DC

## 2022-12-03 MED ORDER — BENZONATATE 200 MG PO CAPS: 200.0000 mg | ORAL_CAPSULE | Freq: Two times a day (BID) | ORAL | 0 refills | Status: DC | PRN

## 2022-12-03 MED ORDER — CETIRIZINE HCL 10 MG PO TABS: 10.0000 mg | ORAL_TABLET | Freq: Every day | ORAL | 1 refills | Status: AC

## 2022-12-03 NOTE — Assessment & Plan Note (Signed)
Adding prednisone burst.  Add cetirizine daily .  Tessalon perles prn.  Stay well hydrated.

## 2022-12-03 NOTE — Progress Notes (Signed)
Amber Hanson - 62 y.o. female MRN [id number]  Date of birth: [birth year]  Subjective Chief Complaint  Patient presents with   URI    HPI Amber Hanson is a 62 y.o. female here today with complaint of chest congestion and cough.  Symptoms started about 2 weeks ago.  Initially started with sore throat, fatigue, nasal congestion which progressed to the chest congestion and cough that she has now.  She denies fever or chills.  She does not feel shortness of breath and denies sinus pain/pressure.  So far she has tried mucinex.  She is staying well hydrated.   ROS:  A comprehensive ROS was completed and negative except as noted per HPI  Allergies  Allergen Reactions   Other Itching, Nausea And Vomiting and Other (See Comments)    Patient states she has taken this safely before since the one episode of itching  Anti-inflammatory - affects liver function Patient states she has taken this safely before since the one episode of itching  Anti-inflammatory - affects liver function  Patient states she has taken this safely before since the one episode of itching  Anti-inflammatory - affects liver function   Red Dye #40 (Allura Red) Itching    Patient states she has taken this safely before since the one episode of itching     Sulfa Antibiotics Anaphylaxis   Sulfasalazine Anaphylaxis and Swelling   Ibuprofen Other (See Comments)    Affects liver functions    Past Medical History:  Diagnosis Date   Anxiety 03/08/2017   Arthritis 03/08/2017   Bipolar 1 disorder (HCC) 08/20/2015   Overview:  Dr. Chilton Si, [facility]. Now Dr Jordan Likes   Last Assessment & Plan:  Relevant Hx: Course: Daily Update: Today's Plan:   Chronic low back pain 07/05/2011   Overview:  Dr. Karie Schwalbe. [facility]   Gastroesophageal reflux disease without esophagitis 08/20/2015   Insomnia 03/05/2013   Migraine without status migrainosus, not intractable 09/19/2012   Postmenopausal HRT (hormone replacement therapy) 08/20/2015    Past  Surgical History:  Procedure Laterality Date   CESAREAN SECTION     (2)    CHOLECYSTECTOMY     COLONOSCOPY  2007   in [place]- normal exam   KNEE SURGERY Bilateral    VAGINAL HYSTERECTOMY      Social History   Socioeconomic History   Marital status: Widowed    Spouse name: Not on file   Number of children: 2   Years of education: 14   Highest education level: Associate degree: academic program  Occupational History   Occupation: [employer]    Comment: Working part time   Occupation: disabled  Tobacco Use   Smoking status: Never   Smokeless tobacco: Never  Vaping Use   Vaping status: Never Used  Substance and Sexual Activity   Alcohol use: No   Drug use: No   Sexual activity: Not Currently    Partners: Male    Birth control/protection: None  Other Topics Concern   Not on file  Social History Narrative   Lives with a friend. She enjoys gardening and painting.   Social Determinants of Health   Financial Resource Strain: Low Risk  (10/11/2022)   Overall Financial Resource Strain (CARDIA)    Difficulty of Paying Living Expenses: Not hard at all  Food Insecurity: No Food Insecurity (10/11/2022)   Hunger Vital Sign    Worried About Running Out of Food in the Last Year: Never true    Ran Out of Food in the  Last Year: Never true  Transportation Needs: No Transportation Needs (10/11/2022)   PRAPARE - Administrator, Civil Service (Medical): No    Lack of Transportation (Non-Medical): No  Physical Activity: Sufficiently Active (10/11/2022)   Exercise Vital Sign    Days of Exercise per Week: 3 days    Minutes of Exercise per Session: 60 min  Stress: Stress Concern Present (10/11/2022)   [employer] of Occupational Health - Occupational Stress Questionnaire    Feeling of Stress : To some extent  Social Connections: Unknown (10/11/2022)   Social Connection and Isolation Panel [NHANES]    Frequency of Communication with Friends and Family: Three times a week     Frequency of Social Gatherings with Friends and Family: Once a week    Attends Religious Services: Never    Database administrator or Organizations: No    Attends Engineer, structural: Never    Marital Status: Not on file    Family History  Problem Relation Age of Onset   Cancer Mother        BONE   Depression Father    Diabetes Father    Depression Sister    Lung cancer Sister    Uterine cancer Sister    Depression Brother    Stomach cancer Maternal Grandmother    Stroke Other    GER disease Other    Colon cancer Neg Hx     Health Maintenance  Topic Date Due   COVID-19 Vaccine (3 - 2023-24 season) 12/19/2022 (Originally 10/24/2022)   Zoster Vaccines- Shingrix (2 of 2) 01/11/2023 (Originally 12/18/2020)   Lung Cancer Screening  03/02/2023 (Originally 10/26/2009)   INFLUENZA VACCINE  05/23/2023 (Originally 09/23/2022)   MAMMOGRAM  10/11/2023 (Originally 06/20/2022)   Hepatitis C Screening  10/11/2023 (Originally 10/26/1977)   HIV Screening  10/11/2023 (Originally 10/27/1974)   Medicare Annual Wellness (AWV)  10/11/2023   Colonoscopy  12/26/2030   HPV VACCINES  Aged Out   DTaP/Tdap/Td  Discontinued     ----------------------------------------------------------------------------------------------------------------------------------------------------------------------------------------------------------------- Physical Exam BP (!) 150/99 (BP Location: Left Arm, Patient Position: Sitting, Cuff Size: Normal)   Pulse 80   Ht 5\' 4"  (1.626 m)   Wt 153 lb (69.4 kg)   LMP  (LMP Unknown)   SpO2 99%   BMI 26.26 kg/m   Physical Exam Constitutional:      Appearance: Normal appearance.  Eyes:     General: No scleral icterus. Cardiovascular:     Rate and Rhythm: Normal rate and regular rhythm.  Pulmonary:     Effort: Pulmonary effort is normal.     Breath sounds: Normal breath sounds.  Neurological:     General: No focal deficit present.     Mental Status: She is alert.   Psychiatric:        Mood and Affect: Mood normal.        Behavior: Behavior normal.     ------------------------------------------------------------------------------------------------------------------------------------------------------------------------------------------------------------------- Assessment and Plan  Viral URI with cough Adding prednisone burst.  Add cetirizine daily .  Tessalon perles prn.  Stay well hydrated.    Meds ordered this encounter  Medications   predniSONE (DELTASONE) 50 MG tablet    Sig: Take 1 tab po daily x5 days    Dispense:  5 tablet    Refill:  0   benzonatate (TESSALON) 200 MG capsule    Sig: Take 1 capsule (200 mg total) by mouth 2 (two) times daily as needed for cough.    Dispense:  30 capsule  Refill:  0   cetirizine (ZYRTEC) 10 MG tablet    Sig: Take 1 tablet (10 mg total) by mouth daily. As needed    Dispense:  90 tablet    Refill:  1    No follow-ups on file.    This visit occurred during the SARS-CoV-2 public health emergency.  Safety protocols were in place, including screening questions prior to the visit, additional usage of staff PPE, and extensive cleaning of exam room while observing appropriate contact time as indicated for disinfecting solutions.

## 2022-12-29 ENCOUNTER — Encounter: Payer: Self-pay | Admitting: Family Medicine

## 2022-12-29 ENCOUNTER — Ambulatory Visit (INDEPENDENT_AMBULATORY_CARE_PROVIDER_SITE_OTHER): Payer: Medicare PPO | Admitting: Family Medicine

## 2022-12-29 VITALS — BP 118/79 | HR 93 | Ht 64.0 in | Wt 152.0 lb

## 2022-12-29 DIAGNOSIS — M545 Low back pain, unspecified: Secondary | ICD-10-CM

## 2022-12-29 DIAGNOSIS — M51369 Other intervertebral disc degeneration, lumbar region without mention of lumbar back pain or lower extremity pain: Secondary | ICD-10-CM | POA: Insufficient documentation

## 2022-12-29 DIAGNOSIS — M5416 Radiculopathy, lumbar region: Secondary | ICD-10-CM | POA: Insufficient documentation

## 2022-12-29 DIAGNOSIS — G8929 Other chronic pain: Secondary | ICD-10-CM

## 2022-12-29 DIAGNOSIS — M5441 Lumbago with sciatica, right side: Secondary | ICD-10-CM

## 2022-12-29 DIAGNOSIS — M5440 Lumbago with sciatica, unspecified side: Secondary | ICD-10-CM | POA: Insufficient documentation

## 2022-12-29 MED ORDER — TRAMADOL HCL 50 MG PO TABS
50.0000 mg | ORAL_TABLET | Freq: Three times a day (TID) | ORAL | 0 refills | Status: DC | PRN
Start: 2022-12-29 — End: 2023-01-04

## 2022-12-29 MED ORDER — CYCLOBENZAPRINE HCL 10 MG PO TABS: 10.0000 mg | ORAL_TABLET | Freq: Three times a day (TID) | ORAL | 0 refills | Status: DC | PRN

## 2022-12-29 MED ORDER — KETOROLAC TROMETHAMINE 30 MG/ML IJ SOLN
30.0000 mg | Freq: Once | INTRAMUSCULAR | Status: AC
Start: 2022-12-29 — End: 2022-12-29
  Administered 2022-12-29: 30 mg via INTRAMUSCULAR

## 2022-12-29 MED ORDER — PREDNISONE 10 MG (21) PO TBPK: ORAL_TABLET | ORAL | 0 refills | Status: DC

## 2022-12-29 NOTE — Progress Notes (Signed)
Amber Hanson - 62 y.o. female MRN [id number]  Date of birth: 1960/09/24  Subjective Chief Complaint  Patient presents with   Back Pain    HPI Danamarie Minami is a 62 y.o. female here today with complaint of pain in the R lower back and buttock area.  Symptom started about 5 days ago.  She was picking up and moving something items over the weekend.  So far she has tried heat, ice and tylenol without much relief.  Denies weakness into the leg.  No numbness or tingling.    ROS:  A comprehensive ROS was completed and negative except as noted per HPI  Allergies  Allergen Reactions   Other Itching, Nausea And Vomiting and Other (See Comments)    Patient states she has taken this safely before since the one episode of itching  Anti-inflammatory - affects liver function Patient states she has taken this safely before since the one episode of itching  Anti-inflammatory - affects liver function  Patient states she has taken this safely before since the one episode of itching  Anti-inflammatory - affects liver function   Red Dye #40 (Allura Red) Itching    Patient states she has taken this safely before since the one episode of itching     Sulfa Antibiotics Anaphylaxis   Sulfasalazine Anaphylaxis and Swelling   Ibuprofen Other (See Comments)    Affects liver functions    Past Medical History:  Diagnosis Date   Anxiety 03/08/2017   Arthritis 03/08/2017   Bipolar 1 disorder (HCC) 08/20/2015   Overview:  Dr. Chilton Si, [facility]. Now Dr Jordan Likes   Last Assessment & Plan:  Relevant Hx: Course: Daily Update: Today's Plan:   Chronic low back pain 07/05/2011   Overview:  Dr. Karie Schwalbe. [facility]   Gastroesophageal reflux disease without esophagitis 08/20/2015   Insomnia 03/05/2013   Migraine without status migrainosus, not intractable 09/19/2012   Postmenopausal HRT (hormone replacement therapy) 08/20/2015    Past Surgical History:  Procedure Laterality Date   CESAREAN SECTION     (2)     CHOLECYSTECTOMY     COLONOSCOPY  2007   in [place]- normal exam   KNEE SURGERY Bilateral    VAGINAL HYSTERECTOMY      Social History   Socioeconomic History   Marital status: Widowed    Spouse name: Not on file   Number of children: 2   Years of education: 14   Highest education level: Associate degree: academic program  Occupational History   Occupation: [employer]    Comment: Working part time   Occupation: disabled  Tobacco Use   Smoking status: Never   Smokeless tobacco: Never  Vaping Use   Vaping status: Never Used  Substance and Sexual Activity   Alcohol use: No   Drug use: No   Sexual activity: Not Currently    Partners: Male    Birth control/protection: None  Other Topics Concern   Not on file  Social History Narrative   Lives with a friend. She enjoys gardening and painting.   Social Determinants of Health   Financial Resource Strain: Low Risk  (10/11/2022)   Overall Financial Resource Strain (CARDIA)    Difficulty of Paying Living Expenses: Not hard at all  Food Insecurity: No Food Insecurity (10/11/2022)   Hunger Vital Sign    Worried About Running Out of Food in the Last Year: Never true    Ran Out of Food in the Last Year: Never true  Transportation Needs: No Transportation  Needs (10/11/2022)   PRAPARE - Administrator, Civil Service (Medical): No    Lack of Transportation (Non-Medical): No  Physical Activity: Sufficiently Active (10/11/2022)   Exercise Vital Sign    Days of Exercise per Week: 3 days    Minutes of Exercise per Session: 60 min  Stress: Stress Concern Present (10/11/2022)   [employer] of Occupational Health - Occupational Stress Questionnaire    Feeling of Stress : To some extent  Social Connections: Unknown (10/11/2022)   Social Connection and Isolation Panel [NHANES]    Frequency of Communication with Friends and Family: Three times a week    Frequency of Social Gatherings with Friends and Family: Once a week     Attends Religious Services: Never    Database administrator or Organizations: No    Attends Engineer, structural: Never    Marital Status: Not on file    Family History  Problem Relation Age of Onset   Cancer Mother        BONE   Depression Father    Diabetes Father    Depression Sister    Lung cancer Sister    Uterine cancer Sister    Depression Brother    Stomach cancer Maternal Grandmother    Stroke Other    GER disease Other    Colon cancer Neg Hx     Health Maintenance  Topic Date Due   Zoster Vaccines- Shingrix (2 of 2) 01/11/2023 (Originally 12/18/2020)   COVID-19 Vaccine (3 - 2023-24 season) 01/14/2023 (Originally 10/24/2022)   Lung Cancer Screening  03/02/2023 (Originally 10/26/2009)   INFLUENZA VACCINE  05/23/2023 (Originally 09/23/2022)   MAMMOGRAM  10/11/2023 (Originally 06/20/2022)   Hepatitis C Screening  10/11/2023 (Originally 10/26/1977)   HIV Screening  10/11/2023 (Originally 10/27/1974)   Medicare Annual Wellness (AWV)  10/11/2023   Colonoscopy  12/26/2030   HPV VACCINES  Aged Out   DTaP/Tdap/Td  Discontinued     ----------------------------------------------------------------------------------------------------------------------------------------------------------------------------------------------------------------- Physical Exam BP 118/79 (BP Location: Left Arm, Patient Position: Sitting, Cuff Size: Normal)   Pulse 93   Ht 5\' 4"  (1.626 m)   Wt 152 lb (68.9 kg)   LMP  (LMP Unknown)   SpO2 99%   BMI 26.09 kg/m   Physical Exam Constitutional:      Appearance: Normal appearance.  Cardiovascular:     Rate and Rhythm: Normal rate and regular rhythm.  Musculoskeletal:     Comments: TTP along R upper buttock area.  Negative SLR on the R  Neurological:     Mental Status: She is alert.  Psychiatric:        Mood and Affect: Mood normal.        Behavior: Behavior normal.      ------------------------------------------------------------------------------------------------------------------------------------------------------------------------------------------------------------------- Assessment and Plan  Low back pain with sciatica Givne toradol 30mg  in clinic today.  Prednisone burst with flexeril as needed.  Short term tramadol. Recommend home stretches/exercises.  Continue heat prn.  Precautions and red flags reviewed.    Meds ordered this encounter  Medications   traMADol (ULTRAM) 50 MG tablet    Sig: Take 1 tablet (50 mg total) by mouth every 8 (eight) hours as needed for up to 5 days.    Dispense:  15 tablet    Refill:  0   predniSONE (STERAPRED UNI-PAK 21 TAB) 10 MG (21) TBPK tablet    Sig: Taper as directed on packaging.    Dispense:  21 tablet    Refill:  0  cyclobenzaprine (FLEXERIL) 10 MG tablet    Sig: Take 1 tablet (10 mg total) by mouth 3 (three) times daily as needed for muscle spasms.    Dispense:  30 tablet    Refill:  0   ketorolac (TORADOL) 30 MG/ML injection 30 mg    No follow-ups on file.    This visit occurred during the SARS-CoV-2 public health emergency.  Safety protocols were in place, including screening questions prior to the visit, additional usage of staff PPE, and extensive cleaning of exam room while observing appropriate contact time as indicated for disinfecting solutions.

## 2022-12-29 NOTE — Assessment & Plan Note (Signed)
Givne toradol 30mg  in clinic today.  Prednisone burst with flexeril as needed.  Short term tramadol. Recommend home stretches/exercises.  Continue heat prn.  Precautions and red flags reviewed.

## 2023-01-01 ENCOUNTER — Other Ambulatory Visit: Payer: Self-pay | Admitting: Family Medicine

## 2023-01-01 ENCOUNTER — Other Ambulatory Visit (HOSPITAL_COMMUNITY): Payer: Self-pay | Admitting: Psychiatry

## 2023-01-01 DIAGNOSIS — K219 Gastro-esophageal reflux disease without esophagitis: Secondary | ICD-10-CM

## 2023-01-01 DIAGNOSIS — Z780 Asymptomatic menopausal state: Secondary | ICD-10-CM

## 2023-01-01 DIAGNOSIS — Z79890 Hormone replacement therapy (postmenopausal): Secondary | ICD-10-CM

## 2023-01-01 DIAGNOSIS — F319 Bipolar disorder, unspecified: Secondary | ICD-10-CM

## 2023-01-01 DIAGNOSIS — F419 Anxiety disorder, unspecified: Secondary | ICD-10-CM

## 2023-01-03 ENCOUNTER — Telehealth: Payer: Self-pay | Admitting: Family Medicine

## 2023-01-03 NOTE — Telephone Encounter (Signed)
Prescription Request  01/03/2023  LOV: 12/29/2022  What is the name of the medication or equipment? traMADol (ULTRAM) 50 MG tablet   Have you contacted your pharmacy to request a refill? Yes   Which pharmacy would you like this sent to?  [facility] - [place], [place] - [address] [place] [address] Phone: [phone] Fax: [phone]  [phone] [place], [place] - [place] [address] [place] [address] Phone: [phone] Fax: [phone]    Patient notified that their request is being sent to the clinical staff for review and that they should receive a response within 2 business days.   Please advise at [facility] [phone]

## 2023-01-05 ENCOUNTER — Ambulatory Visit: Payer: Medicare PPO

## 2023-01-05 ENCOUNTER — Encounter: Payer: Self-pay | Admitting: Sports Medicine

## 2023-01-05 ENCOUNTER — Ambulatory Visit (INDEPENDENT_AMBULATORY_CARE_PROVIDER_SITE_OTHER): Payer: Medicare PPO | Admitting: Sports Medicine

## 2023-01-05 DIAGNOSIS — M1811 Unilateral primary osteoarthritis of first carpometacarpal joint, right hand: Secondary | ICD-10-CM | POA: Diagnosis not present

## 2023-01-05 DIAGNOSIS — M5416 Radiculopathy, lumbar region: Secondary | ICD-10-CM | POA: Diagnosis not present

## 2023-01-05 DIAGNOSIS — M5126 Other intervertebral disc displacement, lumbar region: Secondary | ICD-10-CM | POA: Diagnosis not present

## 2023-01-05 DIAGNOSIS — M533 Sacrococcygeal disorders, not elsewhere classified: Secondary | ICD-10-CM | POA: Diagnosis not present

## 2023-01-05 DIAGNOSIS — M47817 Spondylosis without myelopathy or radiculopathy, lumbosacral region: Secondary | ICD-10-CM | POA: Diagnosis not present

## 2023-01-05 DIAGNOSIS — M47816 Spondylosis without myelopathy or radiculopathy, lumbar region: Secondary | ICD-10-CM | POA: Diagnosis not present

## 2023-01-05 DIAGNOSIS — M51369 Other intervertebral disc degeneration, lumbar region without mention of lumbar back pain or lower extremity pain: Secondary | ICD-10-CM | POA: Diagnosis not present

## 2023-01-05 DIAGNOSIS — M48061 Spinal stenosis, lumbar region without neurogenic claudication: Secondary | ICD-10-CM | POA: Diagnosis not present

## 2023-01-05 DIAGNOSIS — M79641 Pain in right hand: Secondary | ICD-10-CM | POA: Diagnosis not present

## 2023-01-05 DIAGNOSIS — M5441 Lumbago with sciatica, right side: Secondary | ICD-10-CM

## 2023-01-05 DIAGNOSIS — M2550 Pain in unspecified joint: Secondary | ICD-10-CM | POA: Diagnosis not present

## 2023-01-05 DIAGNOSIS — M4316 Spondylolisthesis, lumbar region: Secondary | ICD-10-CM | POA: Diagnosis not present

## 2023-01-05 MED ORDER — PREDNISONE 10 MG (48) PO TBPK: ORAL_TABLET | Freq: Every day | ORAL | 0 refills | Status: DC

## 2023-01-05 MED ORDER — PREGABALIN 50 MG PO CAPS: ORAL_CAPSULE | ORAL | 6 refills | Status: DC

## 2023-01-05 NOTE — Assessment & Plan Note (Signed)
Increasing pain right first [facility], history of a left CMC arthroplasty, prednisone and therapy will help, if persistence we will proceed with injection, last CMC injection was done in August.

## 2023-01-05 NOTE — Progress Notes (Signed)
    Procedures performed today:    None.  Independent interpretation of notes and tests performed by another provider:   None.  Brief History, Exam, Impression, and Recommendations:    Right lumbar radiculitis Pleasant 62 year old female with known lumbar DDD, had some increasing axial low back pain with radiation down the left leg, did well after a burst of prednisone and Toradol with PCP, then had a fall and now having recurrence of pain. She does have discomfort right sacrum. Due to the fall we will get updated x-rays, we will also add a 12-day taper of prednisone and I am going to increase her Lyrica to 50 in the morning and 100 at night. She does need aggressive formal physical therapy and we can touch base again in 6 weeks.  Primary osteoarthritis of first carpometacarpal joint of one hand, right status post left [facility] arthroplasty Increasing pain right first CMC, history of a left CMC arthroplasty, prednisone and therapy will help, if persistence we will proceed with injection, last CMC injection was done in August.    ____________________________________________ Ihor Austin. Benjamin Stain, M.D., ABFM., CAQSM., AME. Primary Care and Sports Medicine [facility]  Adjunct Professor of Family Medicine  [place] [employer] [facility] [employer]  Restaurant manager, fast food

## 2023-01-05 NOTE — Assessment & Plan Note (Signed)
Pleasant 62 year old female with known lumbar DDD, had some increasing axial low back pain with radiation down the left leg, did well after a burst of prednisone and Toradol with PCP, then had a fall and now having recurrence of pain. She does have discomfort right sacrum. Due to the fall we will get updated x-rays, we will also add a 12-day taper of prednisone and I am going to increase her Lyrica to 50 in the morning and 100 at night. She does need aggressive formal physical therapy and we can touch base again in 6 weeks.

## 2023-01-10 ENCOUNTER — Encounter: Payer: Self-pay | Admitting: Family Medicine

## 2023-01-10 ENCOUNTER — Encounter: Payer: Self-pay | Admitting: Sports Medicine

## 2023-01-10 NOTE — Therapy (Unsigned)
OUTPATIENT PHYSICAL THERAPY THORACOLUMBAR EVALUATION   Patient Name: Amber Hanson MRN: [id number] DOB:01/23/1961, 62 y.o., female Today's Date: 01/11/2023  END OF SESSION:  PT End of Session - 01/11/23 1044     Visit Number 1    Number of Visits 24    Date for PT Re-Evaluation 04/05/23    Authorization Type not available    PT Start Time 0845    PT Stop Time 0938    PT Time Calculation (min) 53 min    Activity Tolerance Patient tolerated treatment well             Past Medical History:  Diagnosis Date   Anxiety 03/08/2017   Arthritis 03/08/2017   Bipolar 1 disorder (HCC) 08/20/2015   Overview:  Dr. Chilton Si, [facility]. Now Dr Jordan Likes   Last Assessment & Plan:  Relevant Hx: Course: Daily Update: Today's Plan:   Chronic low back pain 07/05/2011   Overview:  Dr. Karie Schwalbe. [facility]   Gastroesophageal reflux disease without esophagitis 08/20/2015   Insomnia 03/05/2013   Migraine without status migrainosus, not intractable 09/19/2012   Postmenopausal HRT (hormone replacement therapy) 08/20/2015   Past Surgical History:  Procedure Laterality Date   CESAREAN SECTION     (2)    CHOLECYSTECTOMY     COLONOSCOPY  2007   in [place]- normal exam   KNEE SURGERY Bilateral    VAGINAL HYSTERECTOMY     Patient Active Problem List   Diagnosis Date Noted   Right lumbar radiculitis 12/29/2022   Viral URI with cough 12/03/2022   Lateral epicondylitis, left elbow 10/01/2022   Chest discomfort 07/25/2022   Hip pain, left 07/13/2022   Rosacea 07/13/2022   Impingement syndrome, shoulder, left 06/01/2022   Bilateral carpal tunnel syndrome 06/01/2022   Poison ivy dermatitis 04/29/2022   Abdominal pain 03/01/2022   QT prolongation 07/17/2021   Hormone replacement therapy (HRT) 07/17/2021   Bipolar 2 disorder, major depressive episode (HCC) 09/22/2020   Elevated LFTs 09/18/2020   Right hand pain 07/28/2020   Pilar cyst 06/11/2020   Alcohol intoxication with moderate or severe use  disorder (HCC) 03/05/2020   Tardive dyskinesia 05/10/2019   Bipolar 1 disorder, depressed, full remission (HCC) 05/10/2019   PTSD (post-traumatic stress disorder) 07/05/2018   Greater trochanteric bursitis of left hip 06/09/2017   H/O hysterectomy for benign disease 04/25/2017   History of arthroplasty of right shoulder 04/04/2017   Right cervical radiculopathy 04/04/2017   Anxiety 03/08/2017   Arthritis 03/08/2017   Plantar fasciitis, left 08/16/2016   S/P left knee arthroscopy 11/11/2015   Acute medial meniscal tear 10/29/2015   Allergic rhinitis due to pollen 08/20/2015   Bipolar 1 disorder (HCC) 08/20/2015   Gastroesophageal reflux disease 08/20/2015   Polyarthralgia 08/20/2015   Postmenopausal HRT (hormone replacement therapy) 08/20/2015   Primary osteoarthritis of right knee 07/12/2015   Primary osteoarthritis of first carpometacarpal joint of one hand, right status post left [facility] arthroplasty 09/23/2014   Insomnia 03/05/2013   Migraine without status migrainosus, not intractable 09/19/2012   Menopausal symptoms 06/12/2012    PCP: Dr Everrett Coombe   REFERRING PROVIDER: Dr Rodney Langton  REFERRING DIAG: Acute R sided LBP w/ R sided sciatica; polyarthralgia; primary OA R 1st carpometacarpal joint    Rationale for Evaluation and Treatment: Rehabilitation  THERAPY DIAG:  Radiculopathy, lumbar region  Other symptoms and signs involving the musculoskeletal system  Muscle weakness (generalized)  ONSET DATE: 12/24/22  SUBJECTIVE:  SUBJECTIVE STATEMENT: Patient reports that she is having stabbing pain in the R buttocks and into the R LE to the calf after lifting something at home. She was seen the by Dr Ashley Royalty and treated with med dose pac with some improvement. She fell off her porch  and pain has been significant since that fall ~ 01/05/23. Pain has been intense and she has difficulty walking and moving because of the pain. Treats pack with heat which helps some.   PERTINENT HISTORY:  Neck pain, DDD 2019; R shoulder pain; bilat knee pain; L trochanteric bursitis; LBP; bipolar; PTSD; anxiety  PAIN:  Are you having pain? Yes: NPRS scale: 7/10 increased with standing or walking  Pain location: R posterior hip to lateral thigh and lateral calf  Pain description: stabbing  Aggravating factors: standing; walking  Relieving factors: meds; heat   PRECAUTIONS: None  RED FLAGS: None   WEIGHT BEARING RESTRICTIONS: No  FALLS:  Has patient fallen in last 6 months? Yes. 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  DIAGNOSTIC FINDINGS:  Imaging completed 01/05/23 of R hand; lumbar spine; sacrum/coccyx - results not available   PATIENT SURVEYS:  FOTO not completed   SCREENING FOR RED FLAGS: Bowel or bladder incontinence: No Spinal tumors: No Cauda equina syndrome: No Compression fracture: No Abdominal aneurysm: No  COGNITION: Overall cognitive status: Within functional limits for tasks assessed     SENSATION: WFL  MUSCLE LENGTH: Hamstrings: Right 60 deg; Left 65 deg   POSTURE: rounded shoulders, forward head, increased thoracic kyphosis, flexed trunk , and weight shift left  PALPATION: Muscular tightness R posterior hip through the gluts and piriformis into the posterior lateral  thigh to lateral calf    LUMBAR ROM: pain with all motions  AROM eval  Flexion 80%  Extension 25  Right lateral flexion 70  Left lateral flexion 65  Right rotation 45  Left rotation 40   (Blank rows = not tested)  LOWER EXTREMITY ROM:   tight IR; knee flexion in prone R > L hip  Active  Right eval Left eval  Hip flexion    Hip extension    Hip abduction    Hip adduction    Hip internal rotation    Hip external rotation    Knee flexion    Knee extension    Ankle dorsiflexion    Ankle plantarflexion    Ankle inversion    Ankle eversion     (Blank rows = not tested)  LOWER EXTREMITY MMT:  not tested due to pain - patient is moving LE's well against gravity   MMT Right eval Left eval  Hip flexion    Hip extension    Hip abduction    Hip adduction    Hip internal rotation    Hip external rotation    Knee flexion    Knee extension    Ankle dorsiflexion    Ankle plantarflexion    Ankle inversion    Ankle eversion     (Blank rows =  not tested)  LUMBAR SPECIAL TESTS:  Straight leg raise test: Negative and Slump test: Negative  FUNCTIONAL TESTS:  5 times sit to stand: not completed   GAIT: Distance walked: 40 ft Assistive device utilized: None Level of assistance: Complete Independence Comments: antalgic gait with limp in wt bearing R LE   [facility] Adult PT Treatment:                                                DATE: 01/11/23 Therapeutic Exercise: Prone  Lying prone 2-3 min Knee flexion alternating R/L x 10  Supine  Marching R/L x 10  Sciatic nerve glide R x 10  Piriformis stretch travel 30 sec x 3  Diaphragmatic breathing  Core activation contraction TA   Manual Therapy: STM R posterior hip pt prone  IR/ER R hip with knee in flexion  Therapeutic Activity: Myofacial ball release work posterior R hip  Diaphragmatic breathing  Modalities: Moist heat lumbar and posterior hip areas x 12 min  Self Care: Discussed positions for rest and sleep;  changing positions frequently and avoiding prolonged postures and positons    PATIENT EDUCATION:  Education details: POC; HEP Person educated: Patient Education method: Programmer, multimedia, Facilities manager, Actor cues, Verbal cues, and Handouts Education comprehension: verbalized understanding, returned demonstration, verbal cues required, tactile cues required, and needs further education  HOME EXERCISE PROGRAM: Access Code: [email or web address] URL: [email or web address] Date: 01/11/2023 Prepared by: Corlis Leak  Exercises - Lying Prone  - 2 x daily - 7 x weekly - 1 sets - 3 reps - 30 sec  hold - Prone Knee Flexion  - 2 x daily - 7 x weekly - 1 sets - 10 reps - 2-3 sec  hold - Supine March  - 2 x daily - 7 x weekly - 1-2 sets - 10 reps - 2 sec  hold - Supine Sciatic Nerve Glide  - 2 x daily - 7 x weekly - 1 sets - 8-10 reps - 1-2 sec  hold - Supine Piriformis Stretch with Leg Straight  - 2 x daily - 7 x weekly - 1 sets - 3 reps - 30 sec  hold - Supine Transversus Abdominis Bracing with Pelvic Floor Contraction  - 2 x daily - 7 x weekly - 1 sets - 10 reps - 10sec  hold - Standing Piriformis Release with Ball at Wall  - 2 x daily - 7 x weekly - 30-60 sec  hold  ASSESSMENT:  CLINICAL IMPRESSION: Patient is a 62 y.o. female who was seen today for physical therapy evaluation and treatment for R posterior hip into posterior lateral thigh to lateral calf for the past # weeks with symptoms increased following a fall from her porch ~ 01/05/23. She has had severe throbbing pain in the R hip to calf since the time of the fall. Patient has poor posture and alignment; limited ROM/mobility through trunk and LEs; muscular tightness to palpation; pain limiting functional activities and rest. Patient will benefit from PT to address problems identified.   OBJECTIVE IMPAIRMENTS: Abnormal gait, decreased activity tolerance, decreased mobility, difficulty walking, decreased ROM, decreased strength, increased  muscle spasms, impaired flexibility, improper body mechanics, postural dysfunction, and pain.   ACTIVITY LIMITATIONS: carrying, lifting, bending, sitting, standing, squatting, sleeping, transfers, bed mobility, and locomotion level  PARTICIPATION LIMITATIONS: meal prep, cleaning, laundry, driving, shopping, and yard work  PERSONAL  FACTORS: Age, Behavior pattern, Education, Fitness, Past/current experiences, Time since onset of injury/illness/exacerbation, and comorbidities: including multiple musculoskeletal problems; bipolar disorder; anxiety are also affecting patient's functional outcome.   REHAB POTENTIAL: Good  CLINICAL DECISION MAKING: Stable/uncomplicated  EVALUATION COMPLEXITY: Low   GOALS: Goals reviewed with patient? Yes  SHORT TERM GOALS: Target date: 02/22/2023  Independent in initial HEP  Baseline: Goal status: INITIAL  2.  Decrease pain 25-50% allowing patient to proceed with exercise and functional activities  Baseline:  Goal status: INITIAL   LONG TERM GOALS: Target date: 04/05/2023   Increase AROM through trunk and LEs to WFL's and minimal to no pain > 1-2/10 Baseline:  Goal status: INITIAL  2.  Decrease pain in R posterior hip to LE by 75-80% allowing patient to return to all normal functional activities  Baseline:  Goal status: INITIAL  3.  Patient demonstrates proper back care principles including transfers and transitional movements as well as lifting techniques Baseline:  Goal status: INITIAL  4.  Patient will be independent in HEP  Baseline:  Goal status: INITIAL  PLAN:  PT FREQUENCY: 2x/week  PT DURATION: 12 weeks  PLANNED INTERVENTIONS: 97164- PT Re-evaluation, 97110-Therapeutic exercises, 97530- Therapeutic activity, 97112- Neuromuscular re-education, 97535- Self Care, 95284- Manual therapy, [phone]- Aquatic Therapy, 97014- Electrical stimulation (unattended),   PLAN FOR NEXT SESSION: review and progress exercises; back care education;  manual work, DN,modalities as indicated    [employer], PT 01/11/2023, 10:52 AM   Referring diagnosis? Lumbar radiculopathy   Treatment diagnosis? (if different than referring diagnosis)   Radiculopathy, lumbar region  Other symptoms and signs involving the musculoskeletal system  Muscle weakness (generalized) What was this (referring dx) caused by? []  Surgery [x]  Fall [x]  Ongoing issue []  Arthritis []  Other: ____________  Laterality: [x]  Rt []  Lt []  Both  Check all possible CPT codes:  *CHOOSE 10 OR LESS*    See Planned Interventions listed in the Plan section of the Evaluation.

## 2023-01-11 ENCOUNTER — Other Ambulatory Visit: Payer: Self-pay

## 2023-01-11 ENCOUNTER — Other Ambulatory Visit: Payer: Self-pay | Admitting: Family Medicine

## 2023-01-11 ENCOUNTER — Telehealth: Payer: Self-pay | Admitting: Family Medicine

## 2023-01-11 ENCOUNTER — Ambulatory Visit: Payer: Medicare PPO | Attending: Sports Medicine | Admitting: Rehabilitative and Restorative Service Providers"

## 2023-01-11 ENCOUNTER — Encounter: Payer: Self-pay | Admitting: Rehabilitative and Restorative Service Providers"

## 2023-01-11 DIAGNOSIS — M5416 Radiculopathy, lumbar region: Secondary | ICD-10-CM | POA: Insufficient documentation

## 2023-01-11 DIAGNOSIS — M6281 Muscle weakness (generalized): Secondary | ICD-10-CM | POA: Insufficient documentation

## 2023-01-11 DIAGNOSIS — M1811 Unilateral primary osteoarthritis of first carpometacarpal joint, right hand: Secondary | ICD-10-CM | POA: Diagnosis not present

## 2023-01-11 DIAGNOSIS — R29898 Other symptoms and signs involving the musculoskeletal system: Secondary | ICD-10-CM | POA: Diagnosis not present

## 2023-01-11 MED ORDER — TRAMADOL HCL 50 MG PO TABS: 50.0000 mg | ORAL_TABLET | Freq: Three times a day (TID) | ORAL | 0 refills | Status: AC | PRN

## 2023-01-11 NOTE — Telephone Encounter (Signed)
Tramadol renewed.  Copying Dr. Benjamin Stain as well as xrays have not resulted yet.

## 2023-01-11 NOTE — Telephone Encounter (Signed)
Patient called and requested a refill on Tramadol 50mg  she is in pain from PT    [facility]  [address] [place] [address] Phone [phone]

## 2023-01-17 ENCOUNTER — Ambulatory Visit
Admission: RE | Admit: 2023-01-17 | Discharge: 2023-01-17 | Disposition: A | Discharge status: Home / Self Care | Payer: Medicare PPO | Source: Ambulatory Visit | Attending: Family Medicine | Admitting: Family Medicine

## 2023-01-17 VITALS — BP 147/97 | HR 110 | Temp 98.6°F | Resp 16

## 2023-01-17 DIAGNOSIS — M5440 Lumbago with sciatica, unspecified side: Secondary | ICD-10-CM | POA: Diagnosis not present

## 2023-01-17 DIAGNOSIS — G8929 Other chronic pain: Secondary | ICD-10-CM

## 2023-01-17 MED ORDER — OXYCODONE-ACETAMINOPHEN 7.5-325 MG PO TABS: 1.0000 | ORAL_TABLET | Freq: Three times a day (TID) | ORAL | 0 refills | Status: DC | PRN

## 2023-01-17 MED ORDER — METHYLPREDNISOLONE SODIUM SUCC 125 MG IJ SOLR
125.0000 mg | Freq: Once | INTRAMUSCULAR | Status: AC
  Administered 2023-01-17: 125 mg via INTRAMUSCULAR

## 2023-01-17 NOTE — ED Triage Notes (Addendum)
[facility] of [facility] pain. Pt has been seeing Dr.T and Dr. Ashley Royalty since she had an injury where she feel off the porch a few weeks back and dr. Karie Schwalbe did xrays and did not see any fx. Pt reports she has been doing the PT she was told to do. She reports she has been taking tramadol for the pain which takes the edge off. She only has 3 left. She finished the steroid packs  She is also taking tylenol and lyrica.

## 2023-01-17 NOTE — Discharge Instructions (Addendum)
Advised patient to take medication as directed with food.  Patient advised of sedate of effects of this medication.  Encouraged increase daily water intake to 64 ounces per day while taking this medication.  Advised if symptoms worsen and/or unresolved please follow-up with PCP, [facility], or here for further evaluation.

## 2023-01-17 NOTE — ED Provider Notes (Signed)
[facility]    CSN: [id number] Arrival date & time: 01/17/23  1405      History   Chief Complaint Chief Complaint  Patient presents with   Back Pain    Right    Hip Pain    HPI Amber Hanson is a 62 y.o. female.   HPI 62 year old female presents with lower back pain.  Reports cannot stand on my feet for only a few minutes and started hurting really bad.  Patient reports has been evaluated by Dr. Ashley Royalty and Dr. Karie Schwalbe after injuring her back when she fell off a porch several weeks ago.  PMH significant for chronic low back pain and is followed by orthopedics.  Epic chart review reveals x-ray results of lumbar spine performed on 07/22/2022 revealed degenerative changes of lower spine, patient had new study performed on 01/05/2023 however this exam has not been read by radiology as of this note.  PMH significant for bipolar 1, greater trochanter bursitis of left hip, and polyarthralgia  Past Medical History:  Diagnosis Date   Anxiety 03/08/2017   Arthritis 03/08/2017   Bipolar 1 disorder (HCC) 08/20/2015   Overview:  Dr. Chilton Si, [facility]. Now Dr Jordan Likes   Last Assessment & Plan:  Relevant Hx: Course: Daily Update: Today's Plan:   Chronic low back pain 07/05/2011   Overview:  Dr. Karie Schwalbe. [facility]   Gastroesophageal reflux disease without esophagitis 08/20/2015   Insomnia 03/05/2013   Migraine without status migrainosus, not intractable 09/19/2012   Postmenopausal HRT (hormone replacement therapy) 08/20/2015    Patient Active Problem List   Diagnosis Date Noted   Right lumbar radiculitis 12/29/2022   Viral URI with cough 12/03/2022   Lateral epicondylitis, left elbow 10/01/2022   Chest discomfort 07/25/2022   Hip pain, left 07/13/2022   Rosacea 07/13/2022   Impingement syndrome, shoulder, left 06/01/2022   Bilateral carpal tunnel syndrome 06/01/2022   Poison ivy dermatitis 04/29/2022   Abdominal pain 03/01/2022   QT prolongation 07/17/2021   Hormone replacement  therapy (HRT) 07/17/2021   Bipolar 2 disorder, major depressive episode (HCC) 09/22/2020   Elevated LFTs 09/18/2020   Right hand pain 07/28/2020   Pilar cyst 06/11/2020   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) 03/05/2020   Tardive dyskinesia 05/10/2019   Bipolar 1 disorder, depressed, full remission (HCC) 05/10/2019   PTSD (post-traumatic stress disorder) 07/05/2018   Greater trochanteric bursitis of left hip 06/09/2017   H/O hysterectomy for benign disease 04/25/2017   History of arthroplasty of right shoulder 04/04/2017   Right cervical radiculopathy 04/04/2017   Anxiety 03/08/2017   Arthritis 03/08/2017   Plantar fasciitis, left 08/16/2016   S/P left knee arthroscopy 11/11/2015   Acute medial meniscal tear 10/29/2015   Allergic rhinitis due to pollen 08/20/2015   Bipolar 1 disorder (HCC) 08/20/2015   Gastroesophageal reflux disease 08/20/2015   Polyarthralgia 08/20/2015   Postmenopausal HRT (hormone replacement therapy) 08/20/2015   Primary osteoarthritis of right knee 07/12/2015   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 arthroplasty 09/23/2014   Insomnia 03/05/2013   Migraine without status migrainosus, not intractable 09/19/2012   Menopausal symptoms 06/12/2012    Past Surgical History:  Procedure Laterality Date   CESAREAN SECTION     (2)    CHOLECYSTECTOMY     COLONOSCOPY  2007   in [place]- normal exam   KNEE SURGERY Bilateral    VAGINAL HYSTERECTOMY      OB History  No obstetric history on file.      Home Medications    Prior to Admission medications   Medication Sig Start Date End Date Taking? Authorizing Provider  cyclobenzaprine (FLEXERIL) 10 MG tablet Take 1 tablet (10 mg total) by mouth 3 (three) times daily as needed for muscle spasms. 12/29/22  Yes Everrett Coombe, DO  oxyCODONE-acetaminophen (PERCOCET) 7.5-325 MG tablet Take 1 tablet by mouth every 8 (eight) hours  as needed for up to 24 days for severe pain (pain score 7-10). 01/17/23 02/10/23 Yes Trevor Iha, FNP  predniSONE (STERAPRED UNI-PAK 48 TAB) 10 MG (48) TBPK tablet Take by mouth daily. 12-day taper pack, use as directed for taper 01/05/23  Yes Monica Becton, MD  acetaminophen (TYLENOL) 650 MG CR tablet Take 1 tablet (650 mg total) by mouth every 8 (eight) hours as needed for pain. 12/16/21   Monica Becton, MD  Azelaic Acid 15 % gel Apply 1 Application topically 2 (two) times daily. 07/13/22   Charlton Amor, DO  benzonatate (TESSALON) 200 MG capsule Take 1 capsule (200 mg total) by mouth 2 (two) times daily as needed for cough. 12/03/22   Everrett Coombe, DO  busPIRone (BUSPAR) 15 MG tablet TAKE 1 TABLET BY MOUTH TWICE DAILY 10/28/22   Thresa Ross, MD  celecoxib (CELEBREX) 200 MG capsule TAKE 1 CAPSULE BY MOUTH TWICE A DAY 07/07/22   Everrett Coombe, DO  cetirizine (ZYRTEC) 10 MG tablet Take 1 tablet (10 mg total) by mouth daily. As needed 12/03/22   Everrett Coombe, DO  diclofenac Sodium (VOLTAREN) 1 % GEL Apply 2 g topically 4 (four) times daily. 03/04/21   Everrett Coombe, DO  DULoxetine (CYMBALTA) 60 MG capsule TAKE 2 CAPSULES BY MOUTH EVERY DAY 01/03/23   Thresa Ross, MD  erythromycin ophthalmic ointment Apply 1g (pea sized amount) to eyelids three times daily for 10 days. 08/21/21   Christen Butter, NP  estradiol (ESTRACE VAGINAL) 0.1 MG/GM vaginal cream Place 1 Applicatorful vaginally 3 (three) times a week. 10/15/22   Everrett Coombe, DO  estradiol (ESTRACE) 1 MG tablet TAKE 1 TABLET BY MOUTH EVERY DAY 01/04/23   Everrett Coombe, DO  ketoconazole (NIZORAL) 2 % cream Apply 1 application topically 2 (two) times daily. To affected areas. 10/23/20   Sunnie Nielsen, DO  pantoprazole (PROTONIX) 20 MG tablet TAKE 1 TABLET BY MOUTH EVERY DAY 01/04/23   Everrett Coombe, DO  pregabalin (LYRICA) 50 MG capsule 50 mg in the morning, 100 mg at night 01/05/23   Monica Becton, MD   propranolol (INDERAL) 20 MG tablet TAKE 1 TABLET TWICE DAILY 03/04/21   Everrett Coombe, DO  QUEtiapine (SEROQUEL) 100 MG tablet Take at night 11/08/22   Thresa Ross, MD  valACYclovir (VALTREX) 1000 MG tablet Take 1 tablet (1,000 mg total) by mouth 3 (three) times daily. 11/07/18   Rodolph Bong, MD  prazosin (MINIPRESS) 2 MG capsule  10/19/20 05/01/21  [provider]    Family History Family History  Problem Relation Age of Onset   Cancer Mother        BONE   Depression Father    Diabetes Father    Depression Sister    Lung cancer Sister    Uterine cancer Sister    Depression Brother    Stomach cancer Maternal Grandmother    Stroke Other    GER disease Other    Colon cancer Neg Hx     Social History Social History   Tobacco Use  Smoking status: Never   Smokeless tobacco: Never  Vaping Use   Vaping status: Never Used  Substance Use Topics   Alcohol use: No   Drug use: No     Allergies   Other, Red dye #40 (allura red), Sulfa antibiotics, Sulfasalazine, and Ibuprofen   Review of Systems Review of Systems  Musculoskeletal:  Positive for back pain.  All other systems reviewed and are negative.    Physical Exam Triage Vital Signs ED Triage Vitals  Encounter Vitals Group     BP      Systolic BP Percentile      Diastolic BP Percentile      Pulse      Resp      Temp      Temp src      SpO2      Weight      Height      Head Circumference      Peak Flow      Pain Score      Pain Loc      Pain Education      Exclude from Growth Chart    No data found.  Updated Vital Signs BP (!) 147/97   Pulse (!) 110   Temp 98.6 F (37 C)   Resp 16   LMP  (LMP Unknown)   SpO2 98%   Physical Exam Vitals and nursing note reviewed.  Constitutional:      Appearance: Normal appearance. She is normal weight.  HENT:     Head: Normocephalic and atraumatic.     Mouth/Throat:     Mouth: Mucous membranes are moist.     Pharynx: Oropharynx is clear.  Eyes:      Extraocular Movements: Extraocular movements intact.     Conjunctiva/sclera: Conjunctivae normal.     Pupils: Pupils are equal, round, and reactive to light.  Cardiovascular:     Rate and Rhythm: Normal rate and regular rhythm.     Pulses: Normal pulses.     Heart sounds: Normal heart sounds.  Pulmonary:     Effort: Pulmonary effort is normal.     Breath sounds: Normal breath sounds. No wheezing, rhonchi or rales.  Musculoskeletal:        General: Normal range of motion.     Cervical back: Normal range of motion and neck supple.     Comments: Right-sided lumbar spine/right posterior lateral hip area: Patient reporting radicular pain of this area  Skin:    General: Skin is warm and dry.  Neurological:     General: No focal deficit present.     Mental Status: She is alert and oriented to person, place, and time. Mental status is at baseline.  Psychiatric:        Mood and Affect: Mood normal.        Behavior: Behavior normal.      UC Treatments / Results  Labs (all labs ordered are listed, but only abnormal results are displayed) Labs Reviewed - No data to display  EKG   Radiology No results found.  Procedures Procedures (including critical care time)  Medications Ordered in UC Medications  methylPREDNISolone sodium succinate (SOLU-MEDROL) 125 mg/2 mL injection 125 mg (125 mg Intramuscular Given 01/17/23 1501)    Initial Impression / Assessment and Plan / UC Course  I have reviewed the triage vital signs and the nursing notes.  Pertinent labs & imaging results that were available during my care of the patient were reviewed by me and  considered in my medical decision making (see chart for details).     MDM: 1.  Acute right-sided low back pain without sciatica, sciatica laterality unspecified-IM Solu-Medrol 125 mg given once in clinic and prior to discharge; 2.  Chronic low back pain with sciatica sciatica laterality unspecified, unspecified back pain  laterally-right per patient-Rx'd Percocet 7.5/325 mg tablet: Take 1 tablet every 8 hours as needed for acute/severe right sided low back pain. Advised patient to take medication as directed with food.  Patient advised of sedate of effects of this medication.  Encouraged increase daily water intake to 64 ounces per day while taking this medication.  Advised if symptoms worsen and/or unresolved please follow-up with PCP, [facility], or here for further evaluation.  Discharged home, hemodynamically stable. Final Clinical Impressions(s) / UC Diagnoses   Final diagnoses:  Acute right-sided low back pain with sciatica, sciatica laterality unspecified  Chronic low back pain with sciatica, sciatica laterality unspecified, unspecified back pain laterality     Discharge Instructions      Advised patient to take medication as directed with food.  Patient advised of sedate of effects of this medication.  Encouraged increase daily water intake to 64 ounces per day while taking this medication.  Advised if symptoms worsen and/or unresolved please follow-up with PCP, [facility], or here for further evaluation.     ED Prescriptions     Medication Sig Dispense Auth. Provider   oxyCODONE-acetaminophen (PERCOCET) 7.5-325 MG tablet Take 1 tablet by mouth every 8 (eight) hours as needed for up to 24 days for severe pain (pain score 7-10). 30 tablet Trevor Iha, FNP      I have reviewed the PDMP during this encounter.   Trevor Iha, FNP 01/17/23 1520

## 2023-01-18 ENCOUNTER — Ambulatory Visit: Payer: Medicare PPO | Admitting: Family Medicine

## 2023-01-18 ENCOUNTER — Ambulatory Visit: Payer: Medicare PPO | Admitting: Rehabilitative and Restorative Service Providers"

## 2023-01-25 ENCOUNTER — Ambulatory Visit: Payer: Medicare PPO | Attending: Sports Medicine | Admitting: Rehabilitative and Restorative Service Providers"

## 2023-01-25 DIAGNOSIS — M5416 Radiculopathy, lumbar region: Secondary | ICD-10-CM | POA: Insufficient documentation

## 2023-01-25 DIAGNOSIS — M1811 Unilateral primary osteoarthritis of first carpometacarpal joint, right hand: Secondary | ICD-10-CM | POA: Insufficient documentation

## 2023-01-25 DIAGNOSIS — M6281 Muscle weakness (generalized): Secondary | ICD-10-CM | POA: Insufficient documentation

## 2023-01-25 DIAGNOSIS — R29898 Other symptoms and signs involving the musculoskeletal system: Secondary | ICD-10-CM | POA: Insufficient documentation

## 2023-01-26 ENCOUNTER — Other Ambulatory Visit (HOSPITAL_COMMUNITY): Payer: Self-pay | Admitting: Psychiatry

## 2023-01-26 DIAGNOSIS — F319 Bipolar disorder, unspecified: Secondary | ICD-10-CM

## 2023-01-26 DIAGNOSIS — F419 Anxiety disorder, unspecified: Secondary | ICD-10-CM

## 2023-01-27 ENCOUNTER — Ambulatory Visit: Payer: Medicare PPO | Admitting: Rehabilitative and Restorative Service Providers"

## 2023-01-27 ENCOUNTER — Ambulatory Visit: Payer: Self-pay | Admitting: Family Medicine

## 2023-01-27 ENCOUNTER — Ambulatory Visit
Admission: EM | Admit: 2023-01-27 | Discharge: 2023-01-27 | Disposition: A | Discharge status: Home / Self Care | Payer: Medicare PPO | Attending: Family Medicine | Admitting: Family Medicine

## 2023-01-27 DIAGNOSIS — M5416 Radiculopathy, lumbar region: Secondary | ICD-10-CM | POA: Diagnosis not present

## 2023-01-27 MED ORDER — PREDNISONE 20 MG PO TABS: ORAL_TABLET | ORAL | 0 refills | Status: DC

## 2023-01-27 MED ORDER — OXYCODONE-ACETAMINOPHEN 7.5-325 MG PO TABS: ORAL_TABLET | ORAL | 0 refills | Status: DC

## 2023-01-27 MED ORDER — METHYLPREDNISOLONE SODIUM SUCC 125 MG IJ SOLR
80.0000 mg | Freq: Once | INTRAMUSCULAR | Status: AC
  Administered 2023-01-27: 80 mg via INTRAMUSCULAR

## 2023-01-27 NOTE — Telephone Encounter (Signed)
Spoke with patient again and she said that her son should be there in a few minutes probably.  The RN advised her that we would give her another call back if she didn't mind just to ensure she is taken care of.  Patient appreciated it and advised that would be fine.

## 2023-01-27 NOTE — Telephone Encounter (Signed)
Called pt, pt reporting she got a ride and is on her way to UC now. No further questions or concerns at this time.

## 2023-01-27 NOTE — Telephone Encounter (Addendum)
Called and spoke with patient who advised that her son used her car yesterday and she couldn't find the car keys.  She states she is going to call him and see if he can drive her or find out where the keys are.  She is advised that we would like for someone else to drive her if she is in too much pain.  She advised about her established appt with the PCP office tomorrow but states she is goin got the UC after she speaks with her son.  This RN asked her if it was okay that we give her another call back in order to ensure disposition.  Patient advised that would be fine and she was going to call her son.  Patient is advised that if anything changes or worsens for her to either call us back or call 911 for an ambulance.  Patient verbalized understanding.

## 2023-01-27 NOTE — Telephone Encounter (Signed)
Copied from CRM [id number]. Topic: Clinical - Red Word Triage >> Jan 27, 2023 11:37 AM Larwance Sachs wrote: Red Word that prompted transfer to Nurse Triage: Back pain increased severely right leg numbness in toes, shooting pain in knee and buttocks   Chief Complaint: Numbness, Tingling, and Pain in RLE Symptoms: Numbness, Pins and Needles in Right Great Toe, Lower Back Pain. Frequency: Acute Pertinent Negatives: Patient denies chest pain, shortness of breath, dizziness, visual changes, slurred speech, or weakness.  Disposition: [] ED /[x] Urgent Care (no appt availability in office) / [] Appointment(In office/virtual)/ []  [facility]/ [] Home Care/ [] Refused Recommended Disposition /[] [facility] [place] [facility]/ []  Follow-up with PCP Additional Notes: LB is a 62 year old female who called in with worsening of symptoms. The patient has an established appointment in office tomorrow, however is complaining of the pain, numbness, and tingling in her lower back and right leg becoming progressively worse overnight. The patient denied any visual changes, slurred speech, or generalized weakness. Major complaint was constant shooting pain to the affected areas. Advised patient to go to the nearest Urgent Care and follow up arranged to ensure disposition.     Reason for Disposition  [1] Tingling (e.g., pins and needles) of the face, arm / hand, or leg / foot on one side of the body AND [2] present now (Exceptions: Chronic or recurrent symptom lasting > 4 weeks; or tingling from known cause, such as: bumped elbow, carpal tunnel syndrome, pinched nerve, frostbite.)  Answer Assessment - Initial Assessment Questions 1. SYMPTOM: "What is the main symptom you are concerned about?" (e.g., weakness, numbness)     Lower Back Pain (8), Right Leg Pain (Quads, Thighs), describes a knot like feeling. Shooting, knee pain. Numbness in right great toe.  2. ONSET: "When did this start?" (minutes, hours, days; while  sleeping)     A month ago 3. LAST NORMAL: "When was the last time you (the patient) were normal (no symptoms)?"     A month ago.  4. PATTERN "Does this come and go, or has it been constant since it started?"  "Is it present now?"     Constant currently, was intermittent previously.  5. CARDIAC SYMPTOMS: "Have you had any of the following symptoms: chest pain, difficulty breathing, palpitations?"     No 6. NEUROLOGIC SYMPTOMS: "Have you had any of the following symptoms: headache, dizziness, vision loss, double vision, changes in speech, unsteady on your feet?"     No 7. OTHER SYMPTOMS: "Do you have any other symptoms?"     Groggy-like feeling. Fatigued.  8. PREGNANCY: "Is there any chance you are pregnant?" "When was your last menstrual period?"     No  Protocols used: Neurologic Deficit-A-AH

## 2023-01-27 NOTE — ED Provider Notes (Signed)
[facility]    CSN: [id number] Arrival date & time: 01/27/23  1407      History   Chief Complaint Chief Complaint  Patient presents with   Back Pain    HPI Amber Hanson is a 62 y.o. female.   Patient has a history of chronic low back pain, followed by Dr. Rodney Langton for right lumbar radiculitis. At a follow-up visit on 01/05/23 she received a 12 day course of prednisone and increased dose Lyrica to 50mg  AM and 100mg  bedtime.  Her right lower back pain has now increased, radiating down her right posterior leg to the foot at times.  She denies bowel or bladder dysfunction, and no saddle numbness.  She states that she has an appointment with Dr. Rodney Langton tomorrow.   The history is provided by the patient.    Past Medical History:  Diagnosis Date   Anxiety 03/08/2017   Arthritis 03/08/2017   Bipolar 1 disorder (HCC) 08/20/2015   Overview:  Dr. Chilton Si, [facility]. Now Dr Jordan Likes   Last Assessment & Plan:  Relevant Hx: Course: Daily Update: Today's Plan:   Chronic low back pain 07/05/2011   Overview:  Dr. Karie Schwalbe. [facility]   Gastroesophageal reflux disease without esophagitis 08/20/2015   Insomnia 03/05/2013   Migraine without status migrainosus, not intractable 09/19/2012   Postmenopausal HRT (hormone replacement therapy) 08/20/2015    Patient Active Problem List   Diagnosis Date Noted   Right lumbar radiculitis 12/29/2022   Viral URI with cough 12/03/2022   Lateral epicondylitis, left elbow 10/01/2022   Chest discomfort 07/25/2022   Hip pain, left 07/13/2022   Rosacea 07/13/2022   Impingement syndrome, shoulder, left 06/01/2022   Bilateral carpal tunnel syndrome 06/01/2022   Poison ivy dermatitis 04/29/2022   Abdominal pain 03/01/2022   QT prolongation 07/17/2021   Hormone replacement therapy (HRT) 07/17/2021   Bipolar 2 disorder, major depressive episode (HCC) 09/22/2020   Elevated LFTs 09/18/2020   Right hand pain 07/28/2020   Pilar  cyst 06/11/2020   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) 03/05/2020   Tardive dyskinesia 05/10/2019   Bipolar 1 disorder, depressed, full remission (HCC) 05/10/2019   PTSD (post-traumatic stress disorder) 07/05/2018   Greater trochanteric bursitis of left hip 06/09/2017   H/O hysterectomy for benign disease 04/25/2017   History of arthroplasty of right shoulder 04/04/2017   Right cervical radiculopathy 04/04/2017   Anxiety 03/08/2017   Arthritis 03/08/2017   Plantar fasciitis, left 08/16/2016   S/P left knee arthroscopy 11/11/2015   Acute medial meniscal tear 10/29/2015   Allergic rhinitis due to pollen 08/20/2015   Bipolar 1 disorder (HCC) 08/20/2015   Gastroesophageal reflux disease 08/20/2015   Polyarthralgia 08/20/2015   Postmenopausal HRT (hormone replacement therapy) 08/20/2015   Primary osteoarthritis of right knee 07/12/2015   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      Home Medications    Prior to Admission medications   Medication Sig Start Date End Date  Taking? Authorizing Provider  predniSONE (DELTASONE) 20 MG tablet Take one tab by mouth twice daily for 4 days, then one daily for 3 days. Take with food. 01/27/23  Yes Lattie Haw, MD  acetaminophen (TYLENOL) 650 MG CR tablet Take 1 tablet (650 mg total) by mouth every 8 (eight) hours as needed for pain. 12/16/21   Monica Becton, MD  Azelaic Acid 15 % gel Apply 1 Application topically 2 (two) times daily. 07/13/22   Charlton Amor, DO  benzonatate (TESSALON) 200 MG capsule Take 1 capsule (200 mg total) by mouth 2 (two) times daily as needed for cough. 12/03/22   Everrett Coombe, DO  busPIRone (BUSPAR) 15 MG tablet TAKE 1 TABLET BY MOUTH TWICE A DAY 01/26/23   Thresa Ross, MD  celecoxib (CELEBREX) 200 MG capsule TAKE 1 CAPSULE BY MOUTH TWICE A DAY 07/07/22   Everrett Coombe, DO  cetirizine (ZYRTEC) 10 MG tablet Take 1 tablet (10 mg total) by mouth daily. As needed 12/03/22   Everrett Coombe, DO  cyclobenzaprine (FLEXERIL) 10 MG tablet Take 1 tablet (10 mg total) by mouth 3 (three) times daily as needed for muscle spasms. 12/29/22   Everrett Coombe, DO  diclofenac Sodium (VOLTAREN) 1 % GEL Apply 2 g topically 4 (four) times daily. 03/04/21   Everrett Coombe, DO  DULoxetine (CYMBALTA) 60 MG capsule TAKE 2 CAPSULES BY MOUTH EVERY DAY 01/03/23   Thresa Ross, MD  erythromycin ophthalmic ointment Apply 1g (pea sized amount) to eyelids three times daily for 10 days. 08/21/21   Christen Butter, NP  estradiol (ESTRACE VAGINAL) 0.1 MG/GM vaginal cream Place 1 Applicatorful vaginally 3 (three) times a week. 10/15/22   Everrett Coombe, DO  estradiol (ESTRACE) 1 MG tablet TAKE 1 TABLET BY MOUTH EVERY DAY 01/04/23   Everrett Coombe, DO  ketoconazole (NIZORAL) 2 % cream Apply 1 application topically 2 (two) times daily. To affected areas. 10/23/20   Sunnie Nielsen, DO  oxyCODONE-acetaminophen (PERCOCET) 7.5-325 MG tablet Take one tab PO Q8hr PRN severe pain 01/27/23   Lattie Haw, MD  pantoprazole (PROTONIX) 20 MG tablet TAKE 1 TABLET BY MOUTH EVERY DAY 01/04/23   Everrett Coombe, DO  pregabalin (LYRICA) 50 MG capsule 50 mg in the morning, 100 mg at night 01/05/23   Monica Becton, MD  propranolol (INDERAL) 20 MG tablet TAKE 1 TABLET TWICE DAILY 03/04/21   Everrett Coombe, DO  QUEtiapine (SEROQUEL) 100 MG tablet Take at night 11/08/22   Thresa Ross, MD  valACYclovir (VALTREX) 1000 MG tablet Take 1 tablet (1,000 mg total) by mouth 3  (three) times daily. 11/07/18   Rodolph Bong, MD  prazosin (MINIPRESS) 2 MG capsule  10/19/20 05/01/21  [provider]    Family History Family History  Problem Relation Age of Onset   Cancer Mother        BONE   Depression Father    Diabetes Father    Depression Sister    Lung cancer Sister    Uterine cancer Sister    Depression Brother    Stomach cancer Maternal Grandmother    Stroke Other    GER disease Other    Colon cancer Neg Hx     Social History Social History   Tobacco Use   Smoking status: Never   Smokeless tobacco: Never  Vaping Use   Vaping status: Never Used  Substance Use Topics   Alcohol use: No   Drug use: No     Allergies  Other, Red dye #40 (allura red), Sulfa antibiotics, Sulfasalazine, and Ibuprofen   Review of Systems Review of Systems  Constitutional:  Positive for activity change. Negative for appetite change, chills, diaphoresis, fatigue and fever.  Gastrointestinal:  Negative for abdominal pain.  Skin:  Negative for rash.  Neurological:  Negative for weakness.  All other systems reviewed and are negative.    Physical Exam Triage Vital Signs ED Triage Vitals  Encounter Vitals Group     BP 01/27/23 1414 (!) 169/120     Systolic BP Percentile --      Diastolic BP Percentile --      Pulse Rate 01/27/23 1414 87     Resp 01/27/23 1414 17     Temp 01/27/23 1414 98 F (36.7 C)     Temp Source 01/27/23 1414 Oral     SpO2 01/27/23 1414 98 %     Weight --      Height --      Head Circumference --      Peak Flow --      Pain Score 01/27/23 1415 8     Pain Loc --      Pain Education --      Exclude from Growth Chart --    No data found.  Updated Vital Signs BP (!) 169/120 (BP Location: Right Arm)   Pulse 87   Temp 98 F (36.7 C) (Oral)   Resp 17   LMP  (LMP Unknown)   SpO2 98%   Visual Acuity Right Eye Distance:   Left Eye Distance:   Bilateral Distance:    Right Eye Near:   Left Eye Near:    Bilateral  Near:     Physical Exam Vitals and nursing note reviewed.  Constitutional:      General: She is not in acute distress.    Appearance: She is not ill-appearing.  HENT:     Head: Normocephalic.  Eyes:     Extraocular Movements: Extraocular movements intact.     Conjunctiva/sclera: Conjunctivae normal.     Pupils: Pupils are equal, round, and reactive to light.  Cardiovascular:     Rate and Rhythm: Normal rate and regular rhythm.     Heart sounds: Normal heart sounds.  Pulmonary:     Breath sounds: Normal breath sounds.  Abdominal:     Palpations: Abdomen is soft.     Tenderness: There is no abdominal tenderness.  Musculoskeletal:     Lumbar back: Tenderness present. Positive right straight leg raise test.       Back:     Right lower leg: No edema.     Left lower leg: No edema.     Comments: Lumbosacral area has midline and right tenderness to palpation as noted on diagram. Straight leg-raise is positive on right at about 15 degrees and sitting knee extension also positive on the right.    Skin:    General: Skin is warm and dry.     Findings: No rash.  Neurological:     Mental Status: She is alert.      UC Treatments / Results  Labs (all labs ordered are listed, but only abnormal results are displayed) Labs Reviewed - No data to display  EKG   Radiology No results found.  Procedures Procedures (including critical care time)  Medications Ordered in UC Medications  methylPREDNISolone sodium succinate (SOLU-MEDROL) 125 mg/2 mL injection 80 mg (80 mg Intramuscular Given 01/27/23 1501)    Initial Impression / Assessment and  Plan / UC Course  I have reviewed the triage vital signs and the nursing notes.  Pertinent labs & imaging results that were available during my care of the patient were reviewed by me and considered in my medical decision making (see chart for details).    Increasing right lumbar radiculopathy. Administered Solumedrol 80mg  IM  Begin  prednisone burst/taper tomorrow. Rx given for Percocet 7.5-325 (#10, no refill) Followup with Dr. Rodney Langton ([facility] tomorrow.  Final Clinical Impressions(s) / UC Diagnoses   Final diagnoses:  Lumbar radiculopathy, acute     Discharge Instructions      Begin prednisone tomorrow 01/28/23. Apply ice pack to mid-lower back for 20 to 30 minutes, 3 to 4 times daily  Continue until pain decreases.  Avoid lifting.    ED Prescriptions     Medication Sig Dispense Auth. Provider   predniSONE (DELTASONE) 20 MG tablet Take one tab by mouth twice daily for 4 days, then one daily for 3 days. Take with food. 11 tablet Lattie Haw, MD   oxyCODONE-acetaminophen (PERCOCET) 7.5-325 MG tablet Take one tab PO Q8hr PRN severe pain 10 tablet Lattie Haw, MD         Lattie Haw, MD 01/28/23 [phone]

## 2023-01-27 NOTE — Discharge Instructions (Addendum)
Begin prednisone tomorrow 01/28/23. Apply ice pack to mid-lower back for 20 to 30 minutes, 3 to 4 times daily  Continue until pain decreases.  Avoid lifting.

## 2023-01-27 NOTE — ED Triage Notes (Addendum)
Pt c/o back pain x 1.5 months. Was seen last month for similar sxs, rx'd steroids and oxycodone. Still taking oxy prn. Hx of DDD. Has appt with Dr T tomorrow but didn't want to wait.

## 2023-01-28 ENCOUNTER — Encounter: Payer: Self-pay | Admitting: Sports Medicine

## 2023-01-28 ENCOUNTER — Ambulatory Visit: Payer: Medicare PPO | Admitting: Sports Medicine

## 2023-01-28 ENCOUNTER — Ambulatory Visit (INDEPENDENT_AMBULATORY_CARE_PROVIDER_SITE_OTHER): Payer: Medicare PPO | Admitting: Sports Medicine

## 2023-01-28 DIAGNOSIS — M5416 Radiculopathy, lumbar region: Secondary | ICD-10-CM | POA: Diagnosis not present

## 2023-01-28 NOTE — Assessment & Plan Note (Signed)
Pleasant 62 year old female, known lumbar DDD, at this point she is failed over 6 weeks of physical therapy, steroids, Toradol. Lyrica at 50 in the morning and 100 at night not effective. She has had 2 visits to the urgent care. Currently has oxycodone and a taper of prednisone. Due to the failure of the above conservative treatment we will proceed with an MRI for epidural planning, she likely has L4 +/- L5 radiculopathy, she would like me to order the epidural as soon as I see the results.

## 2023-01-28 NOTE — Progress Notes (Signed)
    Procedures performed today:    None.  Independent interpretation of notes and tests performed by another provider:   None.  Brief History, Exam, Impression, and Recommendations:    Right lumbar radiculitis Pleasant 62 year old female, known lumbar DDD, at this point she is failed over 6 weeks of physical therapy, steroids, Toradol. Lyrica at 50 in the morning and 100 at night not effective. She has had 2 visits to the urgent care. Currently has oxycodone and a taper of prednisone. Due to the failure of the above conservative treatment we will proceed with an MRI for epidural planning, she likely has L4 +/- L5 radiculopathy, she would like me to order the epidural as soon as I see the results.    ____________________________________________ Ihor Austin. Benjamin Stain, M.D., ABFM., CAQSM., AME. Primary Care and Sports Medicine [facility]  Adjunct Professor of Family Medicine  [employer]  Restaurant manager, fast food

## 2023-01-31 ENCOUNTER — Ambulatory Visit: Payer: Self-pay | Admitting: Family Medicine

## 2023-01-31 ENCOUNTER — Other Ambulatory Visit: Payer: Medicare PPO

## 2023-01-31 NOTE — Telephone Encounter (Signed)
Copied from CRM [id number]. Topic: Clinical - Red Word Triage >> Jan 31, 2023  8:57 AM Prudencio Pair wrote: Red Word that prompted transfer to Nurse Triage: Patient states she has been having back pain and been receiving treatment for it for about 2 months now. Has been on pain meds & steroids over and over. Pt is waiting to have MRI done but because referral has not been sent, she can't have an appt yet.   Chief Complaint: Referral Request Symptoms: back pain; recent appointment 01/28/2023 Frequency: ongoing Disposition: [] ED /[] Urgent Care (no appt availability in office) / [] Appointment(In office/virtual)/ []  [facility] [facility]/ [] Home Care/ [] Refused Recommended Disposition /[] [facility]/ [x]  Follow-up with PCP Additional Notes: The patient reported that she had an appointment Friday 01/28/2023 and a referral for an MRI was supposed to be sent.  Her insurance company does not have record of the referral.  There is availability for an MRI Tuesday 02/01/2023 but since the referral is still pending, she is concerned that she will have to wait another week.  She stated that she is in pain and does not want her treatment to be delayed any longer.  She requested follow up for a timeline.   Reason for Disposition  [1] Follow-up call to recent contact AND [2] information only call, no triage required  Answer Assessment - Initial Assessment Questions 1. REASON FOR CALL or QUESTION: "What is your reason for calling today?" or "How can I best help you?" or "What question do you have that I can help answer?"     Mobile bus MRI availability Tuesday 12/10 but referral has not been sent and may have to miss the appointment.  Patient is in pain and on steroids and does not want to be delayed any longer.  The referral was supposed to be sent Friday 01/28/2023.  The patient spoke to [facility] and they said they do not have a record of the referral.  Protocols used: Information Only Call - No Triage-A-AH

## 2023-02-01 ENCOUNTER — Telehealth: Payer: Self-pay

## 2023-02-01 NOTE — Telephone Encounter (Signed)
Spoke to the patient. She is aware that order was placed on 01/28/23. Scheduling was waiting for insurance authorization. She's been in contact with imaging concerning scheduling.    Copied from CRM [id number]. Topic: Referral - Status >> Jan 31, 2023  9:01 AM Prudencio Pair wrote: Reason for CRM: Patient is calling and frustrated. She states she is suppose to have an MRI done for her back. States she spoke with [facility] and they have no record of it. Checked pt's chart and advised that the referral is still pending. She states she is getting tired and she is in pain. Pt states she even spoke with the mobile MRI unit and they have an appt for tomorrow, 12/10, but they will not allow her to schedule due to no referral for the MRI. Pt also states she has been to the Urgent Care about 2 times already. Please give patient a call back in regards to the MRI that is showing as pending. Pt's CB #:[phone].

## 2023-02-02 NOTE — Telephone Encounter (Signed)
Pt is needing a refill on her medication for her back pain she is almost out and need's a refill today if possible

## 2023-02-03 ENCOUNTER — Telehealth: Payer: Self-pay

## 2023-02-03 MED ORDER — OXYCODONE-ACETAMINOPHEN 7.5-325 MG PO TABS: ORAL_TABLET | ORAL | 0 refills | Status: DC

## 2023-02-03 NOTE — Addendum Note (Signed)
Addended by: Monica Becton on: 02/03/2023 12:19 PM   Modules accepted: Orders

## 2023-02-03 NOTE — Telephone Encounter (Signed)
Auth for MRI was obtained on 01/31/23. Attempts were made to contact the patient for scheduling with no answer. Patient is scheduled to complete the MRI on 02/05/23. Requesting a med refill for pain med.

## 2023-02-03 NOTE — Telephone Encounter (Signed)
Copied from CRM [id number]. Topic: Clinical - Medication Question >> Feb 02, 2023  3:25 PM Alcus Dad H wrote: Reason for CRM: Pt says she was told she would need to be seen in order to get a refill on her oxyCODONE-acetaminophen (PERCOCET) and that the doctor didn't approve the med refill. I'm not seeing any notes about a visit being needed or med refill request being denied. Can someone check into this and give pt a call with clarification on this?

## 2023-02-03 NOTE — Telephone Encounter (Signed)
Rx refilled, standing by until we get MRI results.

## 2023-02-03 NOTE — Telephone Encounter (Signed)
Task was completed. Patient is scheduled to have the MRI on 02/05/23.

## 2023-02-04 NOTE — Telephone Encounter (Signed)
Patient informed. 

## 2023-02-04 NOTE — Telephone Encounter (Signed)
I sent it in yesterday

## 2023-02-05 ENCOUNTER — Other Ambulatory Visit: Payer: Medicare PPO

## 2023-02-05 ENCOUNTER — Ambulatory Visit: Payer: Medicare PPO

## 2023-02-05 DIAGNOSIS — M48061 Spinal stenosis, lumbar region without neurogenic claudication: Secondary | ICD-10-CM | POA: Diagnosis not present

## 2023-02-05 DIAGNOSIS — M5416 Radiculopathy, lumbar region: Secondary | ICD-10-CM | POA: Diagnosis not present

## 2023-02-05 DIAGNOSIS — M5117 Intervertebral disc disorders with radiculopathy, lumbosacral region: Secondary | ICD-10-CM | POA: Diagnosis not present

## 2023-02-05 DIAGNOSIS — M5116 Intervertebral disc disorders with radiculopathy, lumbar region: Secondary | ICD-10-CM | POA: Diagnosis not present

## 2023-02-05 DIAGNOSIS — M4726 Other spondylosis with radiculopathy, lumbar region: Secondary | ICD-10-CM | POA: Diagnosis not present

## 2023-02-08 ENCOUNTER — Other Ambulatory Visit: Payer: Self-pay | Admitting: Sports Medicine

## 2023-02-08 ENCOUNTER — Telehealth (HOSPITAL_COMMUNITY): Payer: Medicare PPO | Admitting: Psychiatry

## 2023-02-08 ENCOUNTER — Other Ambulatory Visit: Payer: Self-pay | Admitting: Family Medicine

## 2023-02-08 ENCOUNTER — Other Ambulatory Visit (HOSPITAL_COMMUNITY): Payer: Self-pay | Admitting: Psychiatry

## 2023-02-08 ENCOUNTER — Telehealth: Payer: Self-pay

## 2023-02-08 DIAGNOSIS — Z79890 Hormone replacement therapy (postmenopausal): Secondary | ICD-10-CM

## 2023-02-08 DIAGNOSIS — Z780 Asymptomatic menopausal state: Secondary | ICD-10-CM

## 2023-02-08 DIAGNOSIS — F419 Anxiety disorder, unspecified: Secondary | ICD-10-CM

## 2023-02-08 DIAGNOSIS — M5416 Radiculopathy, lumbar region: Secondary | ICD-10-CM

## 2023-02-08 DIAGNOSIS — M1990 Unspecified osteoarthritis, unspecified site: Secondary | ICD-10-CM

## 2023-02-08 DIAGNOSIS — G8929 Other chronic pain: Secondary | ICD-10-CM

## 2023-02-08 DIAGNOSIS — L237 Allergic contact dermatitis due to plants, except food: Secondary | ICD-10-CM

## 2023-02-08 DIAGNOSIS — F319 Bipolar disorder, unspecified: Secondary | ICD-10-CM

## 2023-02-08 MED ORDER — OXYCODONE-ACETAMINOPHEN 7.5-325 MG PO TABS: ORAL_TABLET | ORAL | 0 refills | Status: DC

## 2023-02-08 NOTE — Telephone Encounter (Signed)
Copied from CRM [id number]. Topic: Clinical - Medication Refill >> Feb 08, 2023  2:53 PM Maxwell Marion wrote: Most Recent Primary Care Visit:  Provider: Monica Becton  Department: [facility]  Visit Type: OFFICE VISIT  Date: 01/28/2023  Medication: oxyCODONE-acetaminophen (PERCOCET) 7.5-325 MG  Has the patient contacted their pharmacy? Yes (Agent: If no, request that the patient contact the pharmacy for the refill. If patient does not wish to contact the pharmacy document the reason why and proceed with request.) (Agent: If yes, when and what did the pharmacy advise?)  Is this the correct pharmacy for this prescription? Yes If no, delete pharmacy and type the correct one.  This is the patient's preferred pharmacy:  [id number] - [place], [place] - [address] [place] [address] Phone: [phone] Fax: [phone]    Has the prescription been filled recently? Yes  Is the patient out of the medication? No  Has the patient been seen for an appointment in the last year OR does the patient have an upcoming appointment? Yes  Can we respond through MyChart? Yes  Agent: Please be advised that Rx refills may take up to 3 business days. We ask that you follow-up with your pharmacy.

## 2023-02-08 NOTE — Telephone Encounter (Signed)
Copied from CRM [id number]. Topic: Clinical - Medication Refill >> Feb 08, 2023  2:53 PM Maxwell Marion wrote: Most Recent Primary Care Visit:  Provider: Monica Becton  Department: [facility]  Visit Type: OFFICE VISIT  Date: 01/28/2023  Medication: ***  Has the patient contacted their pharmacy?  (Agent: If no, request that the patient contact the pharmacy for the refill. If patient does not wish to contact the pharmacy document the reason why and proceed with request.) (Agent: If yes, when and what did the pharmacy advise?)  Is this the correct pharmacy for this prescription?  If no, delete pharmacy and type the correct one.  This is the patient's preferred pharmacy:  [id number] - [place], [place] - [address] [place] [address] Phone: [phone] Fax: [phone]  [facility] - [place], [place] - [address] [place] [address] Phone: [phone] Fax: [phone]   Has the prescription been filled recently?   Is the patient out of the medication?   Has the patient been seen for an appointment in the last year OR does the patient have an upcoming appointment?   Can we respond through MyChart?   Agent: Please be advised that Rx refills may take up to 3 business days. We ask that you follow-up with your pharmacy.

## 2023-02-08 NOTE — Telephone Encounter (Signed)
Yes please, I did personally review the MRI, there are multiple disc protrusions but the dominant finding is right sided L5 nerve root compression from a bulging disc.  I am going to go ahead and order her right L5-S1 transforaminal epidural. I would like to see her back approximately 6 weeks after the epidural injection.

## 2023-02-08 NOTE — Telephone Encounter (Signed)
Do you want me to call radiology scheduling to see if can move this MRI read up?

## 2023-02-08 NOTE — Telephone Encounter (Signed)
Patient requesting rx rf of oxycodone  Last written 02/03/23 as qty #10 taking two per day - she is now out of medication.  Last OV 01/28/2023 Upcoming appt 02/10/2023

## 2023-02-08 NOTE — Telephone Encounter (Signed)
Refilled

## 2023-02-08 NOTE — Telephone Encounter (Signed)
Patient informed. Has already heard from scheduling - is scheduled for tomorrow at 12:15 -  she is also requesting rx of oxycodone - states she does not have any for tomorrow- was only given #10 taking twice daily - and is now [facility]-  Will forward this request in another message.

## 2023-02-08 NOTE — Telephone Encounter (Signed)
Refill already sent on 02/03/23-  Attempted call to patient. Left a voice mail message requesting a return call.

## 2023-02-08 NOTE — Telephone Encounter (Signed)
Copied from CRM [id number]. Topic: Clinical - Lab/Test Results >> Feb 08, 2023 12:54 PM [name] wrote: Reason for CRM: patient is calling concerning mri and results. Patient is hurting and its not getting better . Patient is needing to know results and about the shots or injections Dr T talked about call back number [phone]. Patient also stated she has 2 more pain pills left . Patient say she can't suffer the pain is bad

## 2023-02-08 NOTE — Telephone Encounter (Signed)
Patient has called multiple times today requesting a med refill for the pain medication. Patient has been out since yesterday and is in extreme pain. Patient is schedule for the epidural injection tomorrow at 1230 at [facility] imaging.

## 2023-02-09 ENCOUNTER — Ambulatory Visit
Admission: RE | Admit: 2023-02-09 | Discharge: 2023-02-09 | Disposition: A | Discharge status: Home / Self Care | Payer: Medicare PPO | Source: Ambulatory Visit | Attending: Sports Medicine | Admitting: Sports Medicine

## 2023-02-09 DIAGNOSIS — M5416 Radiculopathy, lumbar region: Secondary | ICD-10-CM

## 2023-02-09 DIAGNOSIS — M4727 Other spondylosis with radiculopathy, lumbosacral region: Secondary | ICD-10-CM | POA: Diagnosis not present

## 2023-02-09 MED ORDER — IOPAMIDOL (ISOVUE-M 200) INJECTION 41%
1.0000 mL | Freq: Once | INTRAMUSCULAR | Status: AC
  Administered 2023-02-09: 1 mL via EPIDURAL

## 2023-02-09 MED ORDER — METHYLPREDNISOLONE ACETATE 40 MG/ML INJ SUSP (RADIOLOG
80.0000 mg | Freq: Once | INTRAMUSCULAR | Status: AC
Start: 2023-02-09 — End: 2023-02-09
  Administered 2023-02-09: 80 mg via EPIDURAL

## 2023-02-09 NOTE — Telephone Encounter (Signed)
Patient informed. 

## 2023-02-09 NOTE — Discharge Instructions (Signed)

## 2023-02-10 ENCOUNTER — Ambulatory Visit: Payer: Medicare PPO | Admitting: Sports Medicine

## 2023-02-13 ENCOUNTER — Other Ambulatory Visit: Payer: Self-pay | Admitting: Sports Medicine

## 2023-02-13 ENCOUNTER — Encounter (INDEPENDENT_AMBULATORY_CARE_PROVIDER_SITE_OTHER): Payer: Medicare PPO | Admitting: Sports Medicine

## 2023-02-13 DIAGNOSIS — M5416 Radiculopathy, lumbar region: Secondary | ICD-10-CM

## 2023-02-14 ENCOUNTER — Telehealth (HOSPITAL_COMMUNITY): Payer: Medicare PPO | Admitting: Psychiatry

## 2023-02-14 ENCOUNTER — Encounter (HOSPITAL_COMMUNITY): Payer: Self-pay | Admitting: Psychiatry

## 2023-02-14 DIAGNOSIS — F319 Bipolar disorder, unspecified: Secondary | ICD-10-CM

## 2023-02-14 DIAGNOSIS — F5102 Adjustment insomnia: Secondary | ICD-10-CM | POA: Diagnosis not present

## 2023-02-14 DIAGNOSIS — F419 Anxiety disorder, unspecified: Secondary | ICD-10-CM | POA: Diagnosis not present

## 2023-02-14 DIAGNOSIS — F411 Generalized anxiety disorder: Secondary | ICD-10-CM

## 2023-02-14 MED ORDER — OXYCODONE-ACETAMINOPHEN 7.5-325 MG PO TABS: ORAL_TABLET | ORAL | 0 refills | Status: DC

## 2023-02-14 MED ORDER — QUETIAPINE FUMARATE 100 MG PO TABS: ORAL_TABLET | ORAL | 0 refills | Status: DC

## 2023-02-14 NOTE — Progress Notes (Signed)
[facility] follow up visit  Patient Identification: Amber Hanson MRN:  [id number] Date of Evaluation:  02/14/2023 Referral Source: primary care Chief Complaint:   No chief complaint on file. Follow up bipolar Visit Diagnosis:    ICD-10-CM   1. Bipolar 1 disorder (HCC)  F31.9     2. GAD (generalized anxiety disorder)  F41.1     3. Adjustment insomnia  F51.02     4. Anxiety  F41.9     Virtual Visit via Video Note  I connected with Amber Hanson on 02/14/23 at 10:30 AM EST by a video enabled telemedicine application and verified that I am speaking with the correct person using two identifiers.  Location: Patient: home Provider: home office   I discussed the limitations of evaluation and management by telemedicine and the availability of in person appointments. The patient expressed understanding and agreed to proceed.      I discussed the assessment and treatment plan with the patient. The patient was provided an opportunity to ask questions and all were answered. The patient agreed with the plan and demonstrated an understanding of the instructions.   The patient was advised to call back or seek an in-person evaluation if the symptoms worsen or if the condition fails to improve as anticipated.  I provided 20 minutes of non-face-to-face time during this encounter.     History of Present Illness: Patient is a 62  years old currently widowed Caucasian femaleinitially  referred by primary care to establish care for bipolar disorder and anxiety.  She lives by herself has 2 grown kids  History of hospital admission with OD at [facility]   On eval today doing fair mood wise but going thru back condition, pain and injections , that effect sleep and mobility  Seroquel helps with mood and also on cymbalta  On gaba for pain as well   Does not endorse psychotic symptoms  Denies current use of drugs or alcohol although chart suggest history of alcohol intoxication in the  past  Aggravating factor : widow  Modifying factors some friends, dogs    Severity ;manageable Duration more so related for last 2 years after being a widow   Past Psychiatric History: Bipolar, anxiety, grief  Previous Psychotropic Medications: Yes   Substance Abuse History in the last 12 months:  No.  Consequences of Substance Abuse: NA  Past Medical History:  Past Medical History:  Diagnosis Date   Anxiety 03/08/2017   Arthritis 03/08/2017   Bipolar 1 disorder (HCC) 08/20/2015   Overview:  Dr. Chilton Si, [facility]. Now Dr Jordan Likes   Last Assessment & Plan:  Relevant Hx: Course: Daily Update: Today's Plan:   Chronic low back pain 07/05/2011   Overview:  Dr. Karie Schwalbe. [facility]   Gastroesophageal reflux disease without esophagitis 08/20/2015   Insomnia 03/05/2013   Migraine without status migrainosus, not intractable 09/19/2012   Postmenopausal HRT (hormone replacement therapy) 08/20/2015    Past Surgical History:  Procedure Laterality Date   CESAREAN SECTION     (2)    CHOLECYSTECTOMY     COLONOSCOPY  2007   in [place]- normal exam   KNEE SURGERY Bilateral    VAGINAL HYSTERECTOMY      Family Psychiatric History: Dad: depression  Family History:  Family History  Problem Relation Age of Onset   Cancer Mother        BONE   Depression Father    Diabetes Father    Depression Sister    Lung cancer Sister  Uterine cancer Sister    Depression Brother    Stomach cancer Maternal Grandmother    Stroke Other    GER disease Other    Colon cancer Neg Hx     Social History:   Social History   Socioeconomic History   Marital status: Widowed    Spouse name: Not on file   Number of children: 2   Years of education: 14   Highest education level: Associate degree: academic program  Occupational History   Occupation: [employer]    Comment: Working part time   Occupation: disabled  Tobacco Use   Smoking status: Never   Smokeless tobacco: Never  Vaping Use    Vaping status: Never Used  Substance and Sexual Activity   Alcohol use: No   Drug use: No   Sexual activity: Not Currently    Partners: Male    Birth control/protection: None  Other Topics Concern   Not on file  Social History Narrative   Lives with a friend. She enjoys gardening and painting.   Social Drivers of Corporate investment banker Strain: Low Risk  (10/11/2022)   Overall Financial Resource Strain (CARDIA)    Difficulty of Paying Living Expenses: Not hard at all  Food Insecurity: No Food Insecurity (10/11/2022)   Hunger Vital Sign    Worried About Running Out of Food in the Last Year: Never true    Ran Out of Food in the Last Year: Never true  Transportation Needs: No Transportation Needs (10/11/2022)   PRAPARE - Administrator, Civil Service (Medical): No    Lack of Transportation (Non-Medical): No  Physical Activity: Sufficiently Active (10/11/2022)   Exercise Vital Sign    Days of Exercise per Week: 3 days    Minutes of Exercise per Session: 60 min  Stress: Stress Concern Present (10/11/2022)   [employer] of Occupational Health - Occupational Stress Questionnaire    Feeling of Stress : To some extent  Social Connections: Unknown (10/11/2022)   Social Connection and Isolation Panel [NHANES]    Frequency of Communication with Friends and Family: Three times a week    Frequency of Social Gatherings with Friends and Family: Once a week    Attends Religious Services: Never    Database administrator or Organizations: No    Attends Engineer, structural: Never    Marital Status: Not on file    Additional Social History: grew up with mom, parents were divorced, felt she was neglected and endorses bad memories from childhood  Allergies:   Allergies  Allergen Reactions   Other Itching, Nausea And Vomiting and Other (See Comments)    Patient states she has taken this safely before since the one episode of itching  Anti-inflammatory - affects liver  function Patient states she has taken this safely before since the one episode of itching  Anti-inflammatory - affects liver function  Patient states she has taken this safely before since the one episode of itching  Anti-inflammatory - affects liver function   Red Dye #40 (Allura Red) Itching    Patient states she has taken this safely before since the one episode of itching     Sulfa Antibiotics Anaphylaxis   Sulfasalazine Anaphylaxis and Swelling   Ibuprofen Other (See Comments)    Affects liver functions    Metabolic Disorder Labs: No results found for: "HGBA1C", "MPG" No results found for: "PROLACTIN" No results found for: "CHOL", "TRIG", "HDL", "CHOLHDL", "VLDL", "LDLCALC" No results found  for: "TSH"  Therapeutic Level Labs: No results found for: "LITHIUM" No results found for: "CBMZ" No results found for: "VALPROATE"  Current Medications: Current Outpatient Medications  Medication Sig Dispense Refill   acetaminophen (TYLENOL) 650 MG CR tablet Take 1 tablet (650 mg total) by mouth every 8 (eight) hours as needed for pain. 90 tablet 3   Azelaic Acid 15 % gel Apply 1 Application topically 2 (two) times daily. 30 g 0   benzonatate (TESSALON) 200 MG capsule Take 1 capsule (200 mg total) by mouth 2 (two) times daily as needed for cough. 30 capsule 0   busPIRone (BUSPAR) 15 MG tablet TAKE 1 TABLET BY MOUTH TWICE A DAY 180 tablet 0   celecoxib (CELEBREX) 200 MG capsule TAKE 1 CAPSULE BY MOUTH TWICE A DAY 180 capsule 0   cetirizine (ZYRTEC) 10 MG tablet Take 1 tablet (10 mg total) by mouth daily. As needed 90 tablet 1   cyclobenzaprine (FLEXERIL) 10 MG tablet Take 1 tablet (10 mg total) by mouth 3 (three) times daily as needed for muscle spasms. 30 tablet 0   diclofenac Sodium (VOLTAREN) 1 % GEL Apply 2 g topically 4 (four) times daily. 100 g 3   DULoxetine (CYMBALTA) 60 MG capsule TAKE 2 CAPSULES BY MOUTH EVERY DAY 180 capsule 0   erythromycin ophthalmic ointment Apply 1g (pea  sized amount) to eyelids three times daily for 10 days. 30 g 0   estradiol (ESTRACE VAGINAL) 0.1 MG/GM vaginal cream Place 1 Applicatorful vaginally 3 (three) times a week. 42.5 g 12   estradiol (ESTRACE) 1 MG tablet TAKE 1 TABLET BY MOUTH EVERY DAY 90 tablet 0   ketoconazole (NIZORAL) 2 % cream Apply 1 application topically 2 (two) times daily. To affected areas. 30 g 1   oxyCODONE-acetaminophen (PERCOCET) 7.5-325 MG tablet Take one tab PO Q8hr PRN severe pain 10 tablet 0   pantoprazole (PROTONIX) 20 MG tablet TAKE 1 TABLET BY MOUTH EVERY DAY 90 tablet 1   predniSONE (DELTASONE) 20 MG tablet Take one tab by mouth twice daily for 4 days, then one daily for 3 days. Take with food. 11 tablet 0   pregabalin (LYRICA) 50 MG capsule 50 mg in the morning, 100 mg at night 90 capsule 6   propranolol (INDERAL) 20 MG tablet TAKE 1 TABLET TWICE DAILY 180 tablet 1   QUEtiapine (SEROQUEL) 100 MG tablet Take at night 90 tablet 0   valACYclovir (VALTREX) 1000 MG tablet Take 1 tablet (1,000 mg total) by mouth 3 (three) times daily. 21 tablet 0   No current facility-administered medications for this visit.     Psychiatric Specialty Exam: Review of Systems  Cardiovascular:  Negative for chest pain.  Neurological:  Negative for tremors.  Psychiatric/Behavioral:  Negative for agitation, hallucinations and self-injury.     There were no vitals taken for this visit.There is no height or weight on file to calculate BMI.  General Appearance: Casual  Eye Contact:  Fair  Speech:  Normal Rate  Volume:  Decreased  Mood: fair  Affect:  Congruent  Thought Process:  Goal Directed  Orientation:  Full (Time, Place, and Person)  Thought Content:  Rumination  Suicidal Thoughts:  No  Homicidal Thoughts:  No  Memory:  Immediate;   Fair  Judgement:  Fair  Insight:  Shallow  Psychomotor Activity:  Decreased  Concentration:  Concentration: Fair  Recall:  Fair  Fund of Knowledge:Good  Language: Good  Akathisia:  No   Handed:  AIMS (if indicated):  no involuntary movements  Assets:  Desire for Improvement Housing  ADL's:  Intact  Cognition: WNL  Sleep:  Fair   Screenings: GAD-7    Flowsheet Row Office Visit from 03/04/2021 in [facility] Primary Care & Sports Medicine at [facility]  Total GAD-7 Score 12      PHQ2-9    Flowsheet Row Office Visit from 10/11/2022 in [facility] at [facility] Office Visit from 07/13/2022 in [facility] Care & Sports Medicine at [facility] Clinical Support from 10/09/2021 in [facility] Primary Care & Sports Medicine at [facility] Office Visit from 05/01/2021 in [facility] Health Outpatient Behavioral Health at [facility] Office Visit from 03/04/2021 in [facility] Primary Care & Sports Medicine at [facility]  PHQ-2 Total Score 1 2 0 2 2  PHQ-9 Total Score -- -- 0 7 14      Flowsheet Row ED from 01/27/2023 in [facility] at [facility] ED from 01/17/2023 in [facility]gent Care at [facility] Visit from 08/13/2021 in [facility] Outpatient Behavioral Health at [facility]  C-SSRS RISK CATEGORY No Risk No Risk No Risk       Assessment and Plan: as follows  Prior documentation reviewed   Bipolar disorder current episode depressed; manageable conitnue seroquel, cymbalta  Generalized anxiety disorder: worries related to back condition, continue with providers and pain meds as follow up . Continue cymbalta and coping skills  Lonliness: handling it better continue cymbalta and work on adding activitities to distract Insomnia; manageable and seroquel helps. No tremors  Reviewed meds, questions addressed  Fu 3 m.     Thresa Ross, MD 12/23/202410:40 AM

## 2023-02-14 NOTE — Telephone Encounter (Signed)

## 2023-02-23 ENCOUNTER — Other Ambulatory Visit: Payer: Self-pay | Admitting: Sports Medicine

## 2023-02-24 ENCOUNTER — Ambulatory Visit (INDEPENDENT_AMBULATORY_CARE_PROVIDER_SITE_OTHER): Payer: Medicare PPO | Admitting: Sports Medicine

## 2023-02-24 DIAGNOSIS — M2550 Pain in unspecified joint: Secondary | ICD-10-CM | POA: Diagnosis not present

## 2023-02-24 DIAGNOSIS — M5441 Lumbago with sciatica, right side: Secondary | ICD-10-CM | POA: Diagnosis not present

## 2023-02-24 DIAGNOSIS — M5416 Radiculopathy, lumbar region: Secondary | ICD-10-CM

## 2023-02-24 MED ORDER — OXYCODONE-ACETAMINOPHEN 7.5-325 MG PO TABS: ORAL_TABLET | ORAL | 0 refills | Status: DC

## 2023-02-24 MED ORDER — PREGABALIN 100 MG PO CAPS: ORAL_CAPSULE | ORAL | 3 refills | Status: DC

## 2023-02-24 NOTE — Progress Notes (Addendum)
    Procedures performed today:    None.  Independent interpretation of notes and tests performed by another provider:   None.  Brief History, Exam, Impression, and Recommendations:    Right lumbar radiculitis Pleasant 63 year old female, multilevel lumbar spondylosis, she is having axial back pain radiating into the buttock and today more to the great toe on the right. She is status post failure of physical therapy, Lyrica  50 in the morning and 100 at night, as well as a right L5-S1 transforaminal epidural. As she is reporting more L4 distribution radiculitis we will proceed with a right L4-L5 transforaminal epidural. Her facets are profoundly arthritic so they can also certainly be a pain generator, I am going to order facet injections as well, right L3-S1. Increase Lyrica  to 100 mg in the morning and 200 mg at night. We will do another short course of oxycodone  but she understands she needs to minimize use of this, return to see me 1 month after her next injection. For insurance coverage purposes her pain score 7/10.    ____________________________________________ Debby PARAS. Curtis, M.D., ABFM., CAQSM., AME. Primary Care and Sports Medicine  [facility]  Adjunct Professor of [facility]  [employer]  Restaurant Manager, [employer]

## 2023-02-24 NOTE — Assessment & Plan Note (Addendum)
 Pleasant 63 year old female, multilevel lumbar spondylosis, she is having axial back pain radiating into the buttock and today more to the great toe on the right. She is status post failure of physical therapy, Lyrica  50 in the morning and 100 at night, as well as a right L5-S1 transforaminal epidural. As she is reporting more L4 distribution radiculitis we will proceed with a right L4-L5 transforaminal epidural. Her facets are profoundly arthritic so they can also certainly be a pain generator, I am going to order facet injections as well, right L3-S1. Increase Lyrica  to 100 mg in the morning and 200 mg at night. We will do another short course of oxycodone  but she understands she needs to minimize use of this, return to see me 1 month after her next injection. For insurance coverage purposes her pain score 7/10.

## 2023-02-25 ENCOUNTER — Ambulatory Visit: Payer: Medicare PPO | Admitting: Sports Medicine

## 2023-03-01 ENCOUNTER — Other Ambulatory Visit: Payer: Self-pay | Admitting: Sports Medicine

## 2023-03-01 ENCOUNTER — Telehealth: Payer: Self-pay

## 2023-03-01 ENCOUNTER — Encounter: Payer: Self-pay | Admitting: Sports Medicine

## 2023-03-01 DIAGNOSIS — M5416 Radiculopathy, lumbar region: Secondary | ICD-10-CM

## 2023-03-01 NOTE — Telephone Encounter (Signed)
 Please let patient know that she is still getting the epidural on the 27th, I cannot anymore oxycodone , but I am happy to do a muscle relaxer of her choice to supplement the celecoxib  and the pregabalin .  We can also do another course of steroids if she needs.  If she continues to need the oxycodone  it will need to come from a pain management doctor.

## 2023-03-01 NOTE — Telephone Encounter (Signed)
 Faxed notes

## 2023-03-01 NOTE — Telephone Encounter (Signed)
 Most recent note has been updated

## 2023-03-01 NOTE — Telephone Encounter (Signed)
 They needs faxed notes with documentation of pain score and plan of care.    [phone] the Reference number is [id number].   Fax [phone]

## 2023-03-03 ENCOUNTER — Telehealth: Payer: Self-pay

## 2023-03-03 NOTE — Telephone Encounter (Signed)
 Approved [id number] good through April 10 th 2025.  I called and left a message with [facility] advising of approval and authorization number.

## 2023-03-03 NOTE — Telephone Encounter (Signed)
 Please let patient know that she is still getting the epidural on the 27th, I cannot anymore oxycodone , but I am happy to do a muscle relaxer of her choice to supplement the celecoxib  and the pregabalin .  We can also do another course of steroids if she needs.  If she continues to need the oxycodone  it will need to come from a pain management doctor.

## 2023-03-03 NOTE — Telephone Encounter (Signed)
 Patient came into office requesting oxyCODONE-acetaminophen (PERCOCET) 7.5-325 MG tablet [[id number]] for pain, please advise, thanks.

## 2023-03-04 ENCOUNTER — Other Ambulatory Visit: Payer: Medicare PPO

## 2023-03-07 ENCOUNTER — Other Ambulatory Visit (INDEPENDENT_AMBULATORY_CARE_PROVIDER_SITE_OTHER): Payer: Medicare PPO

## 2023-03-07 ENCOUNTER — Ambulatory Visit (INDEPENDENT_AMBULATORY_CARE_PROVIDER_SITE_OTHER): Payer: Medicare PPO | Admitting: Sports Medicine

## 2023-03-07 DIAGNOSIS — M5416 Radiculopathy, lumbar region: Secondary | ICD-10-CM

## 2023-03-07 DIAGNOSIS — M7062 Trochanteric bursitis, left hip: Secondary | ICD-10-CM

## 2023-03-07 MED ORDER — PREGABALIN 200 MG PO CAPS: 200.0000 mg | ORAL_CAPSULE | Freq: Three times a day (TID) | ORAL | 3 refills | Status: DC | PRN

## 2023-03-07 NOTE — Progress Notes (Signed)
    Procedures performed today:    Procedure: Real-time Ultrasound Guided injection of the left greater trochanteric bursa Device: Samsung HS60  Verbal informed consent obtained.  Time-out conducted.  Noted no overlying erythema, induration, or other signs of local infection.  Skin prepped in a sterile fashion.  Local anesthesia: Topical Ethyl chloride.  With sterile technique and under real time ultrasound guidance: Noted intact hip abductors, 1 cc Kenalog  40, 2 cc lidocaine, 2 cc bupivacaine injected easily Completed without difficulty  Advised to call if fevers/chills, erythema, induration, drainage, or persistent bleeding.  Images permanently stored and available for review in PACS.  Impression: Technically successful ultrasound guided injection.  Independent interpretation of notes and tests performed by another provider:   None.  Brief History, Exam, Impression, and Recommendations:    Right lumbar radiculitis Pleasant 63 year old female, multilevel lumbar spondylosis, she is having axial back pain radiating into the buttock and today more to the great toe on the right. She is status post failure of physical therapy, Lyrica  50 in the morning and 100 at night, as well as a right L5-S1 transforaminal epidural. As she is reporting more L4 distribution radiculitis we will proceed with a right L4-L5 transforaminal epidural. Her facets are profoundly arthritic so they can also certainly be a pain generator, I am going to order facet injections as well, right L3-S1. Increase Lyrica  to 100 mg in the morning and 200 mg at night. We will do another short course of oxycodone  but she understands she needs to minimize use of this, return to see me 1 month after her next injection. For insurance coverage purposes her pain score 7/10.  Update: She is scheduled for a L4 transforaminal epidural as her paresthesias were more in an L4 distribution to the right great toe. We are not going to do  any more narcotics but I did tell her we would go ahead and bump up her Lyrica  to the maximum dose, 200 mg 3 times daily. She will call [facility] imaging and try to get her appointment moved up. It sounds as though her facet injections were denied.  Greater trochanteric bursitis of left hip This pleasant 63 year old female was treated for greater trochanteric bursitis/hip abductor tendinopathy in early 2019, she did well with an injection, she had a recurrence of pain back in October, we did an injection, she did well until now, repeat injection, no further trochanteric bursa injections, she will need aggressive physical therapy.    ____________________________________________ Debby PARAS. Curtis, M.D., ABFM., CAQSM., AME. Primary Care and Sports Medicine [facility]  Adjunct Professor of [facility] Medicine  [employer]  Restaurant Manager, [employer]

## 2023-03-07 NOTE — Assessment & Plan Note (Signed)
 Pleasant 63 year old female, multilevel lumbar spondylosis, she is having axial back pain radiating into the buttock and today more to the great toe on the right. She is status post failure of physical therapy, Lyrica  50 in the morning and 100 at night, as well as a right L5-S1 transforaminal epidural. As she is reporting more L4 distribution radiculitis we will proceed with a right L4-L5 transforaminal epidural. Her facets are profoundly arthritic so they can also certainly be a pain generator, I am going to order facet injections as well, right L3-S1. Increase Lyrica  to 100 mg in the morning and 200 mg at night. We will do another short course of oxycodone  but she understands she needs to minimize use of this, return to see me 1 month after her next injection. For insurance coverage purposes her pain score 7/10.  Update: She is scheduled for a L4 transforaminal epidural as her paresthesias were more in an L4 distribution to the right great toe. We are not going to do any more narcotics but I did tell her we would go ahead and bump up her Lyrica  to the maximum dose, 200 mg 3 times daily. She will call [facility] imaging and try to get her appointment moved up. It sounds as though her facet injections were denied.

## 2023-03-07 NOTE — Assessment & Plan Note (Signed)
 This pleasant 63 year old female was treated for greater trochanteric bursitis/hip abductor tendinopathy in early 2019, she did well with an injection, she had a recurrence of pain back in October, we did an injection, she did well until now, repeat injection, no further trochanteric bursa injections, she will need aggressive physical therapy.

## 2023-03-08 ENCOUNTER — Encounter: Payer: Self-pay | Admitting: Family Medicine

## 2023-03-08 ENCOUNTER — Ambulatory Visit (INDEPENDENT_AMBULATORY_CARE_PROVIDER_SITE_OTHER): Payer: Medicare PPO | Admitting: Family Medicine

## 2023-03-08 VITALS — BP 143/91 | HR 87 | Ht 64.0 in | Wt 166.5 lb

## 2023-03-08 DIAGNOSIS — M7062 Trochanteric bursitis, left hip: Secondary | ICD-10-CM

## 2023-03-08 DIAGNOSIS — M5416 Radiculopathy, lumbar region: Secondary | ICD-10-CM | POA: Diagnosis not present

## 2023-03-08 DIAGNOSIS — Z23 Encounter for immunization: Secondary | ICD-10-CM | POA: Diagnosis not present

## 2023-03-08 MED ORDER — CONTRAVE 8-90 MG PO TB12: ORAL_TABLET | ORAL | 0 refills | Status: DC

## 2023-03-08 MED ORDER — TRIAMCINOLONE ACETONIDE 40 MG/ML IJ SUSP
40.0000 mg | Freq: Once | INTRAMUSCULAR | Status: AC
  Administered 2023-03-08: 40 mg via INTRAMUSCULAR

## 2023-03-08 NOTE — Assessment & Plan Note (Signed)
 She has obesity with co-morbidty of chronic low back pain.  She also has elevated BP today.  Referral to dietician.  We discussed medications avaialable to weight management.  She will check with insurance to see if GLP-1 is covered.  Will add contrave.

## 2023-03-08 NOTE — Progress Notes (Signed)
 Amber Hanson - 63 y.o. female MRN [id number]  Date of birth: 07/22/1960  Subjective Chief Complaint  Patient presents with   Weight Management Screening    Patient c/o back pain - wanting to discuss weight loss options to see if this would help with pain   Shoulder Pain    Bilateral shoulder pain - left worse than Right    HPI Amber Hanson is a 63 y.o. female here today for follow up.  She has had issues with her back and has been seeing Dr. Curtis recently for management of this.  She thinks that weight loss would benefit her back pain as well.  She is requesting help with this.  She would like to try medication to help with this.   She reports that diet is improved to where she was previously. SABRA  Her activity level has been limited due to her back pain.   ROS:  A comprehensive ROS was completed and negative except as noted per HPI  Allergies  Allergen Reactions   Other Itching, Nausea And Vomiting and Other (See Comments)    Patient states she has taken this safely before since the one episode of itching  Anti-inflammatory - affects liver function Patient states she has taken this safely before since the one episode of itching  Anti-inflammatory - affects liver function  Patient states she has taken this safely before since the one episode of itching  Anti-inflammatory - affects liver function   Red Dye #40 (Allura Red) Itching    Patient states she has taken this safely before since the one episode of itching     Sulfa Antibiotics Anaphylaxis   Sulfasalazine Anaphylaxis and Swelling   Ibuprofen Other (See Comments)    Affects liver functions    Past Medical History:  Diagnosis Date   Anxiety 03/08/2017   Arthritis 03/08/2017   Bipolar 1 disorder (HCC) 08/20/2015   Overview:  Dr. Landy, [facility]. Now Dr Louis   Last Assessment & Plan:  Relevant Hx: Course: Daily Update: Today's Plan:   Chronic low back pain 07/05/2011   Overview:  Dr. ONEIDA. Spang[facility], Orth[facility]    Gastroesophageal reflux disease without esophagitis 08/20/2015   Insomnia 03/05/2013   Migraine without status migrainosus, not intractable 09/19/2012   Postmenopausal HRT (hormone replacement therapy) 08/20/2015    Past Surgical History:  Procedure Laterality Date   CESAREAN SECTION     (2)    CHOLECYSTECTOMY     COLONOSCOPY  2007   in [place]- normal exam   KNEE SURGERY Bilateral    VAGINAL HYSTERECTOMY      Social History   Socioeconomic History   Marital status: Widowed    Spouse name: Not on file   Number of children: 2   Years of education: 14   Highest education level: Associate degree: academic program  Occupational History   Occupation: [employer]    Comment: Working part time   Occupation: disabled  Tobacco Use   Smoking status: Never   Smokeless tobacco: Never  Vaping Use   Vaping status: Never Used  Substance and Sexual Activity   Alcohol use: No   Drug use: No   Sexual activity: Not Currently    Partners: Male    Birth control/protection: None  Other Topics Concern   Not on file  Social History Narrative   Lives with a friend. She enjoys gardening and painting.   Social Drivers of Health   Financial Resource Strain: Low Risk  (10/11/2022)   Overall  Financial Resource Strain (CARDIA)    Difficulty of Paying Living Expenses: Not hard at all  Food Insecurity: No Food Insecurity (10/11/2022)   Hunger Vital Sign    Worried About Running Out of Food in the Last Year: Never true    Ran Out of Food in the Last Year: Never true  Transportation Needs: No Transportation Needs (10/11/2022)   PRAPARE - Administrator, Civil Service (Medical): No    Lack of Transportation (Non-Medical): No  Physical Activity: Sufficiently Active (10/11/2022)   Exercise Vital Sign    Days of Exercise per Week: 3 days    Minutes of Exercise per Session: 60 min  Stress: Stress Concern Present (10/11/2022)   [employer] of Occupational Health - Occupational Stress  Questionnaire    Feeling of Stress : To some extent  Social Connections: Unknown (10/11/2022)   Social Connection and Isolation Panel [NHANES]    Frequency of Communication with Friends and Family: Three times a week    Frequency of Social Gatherings with Friends and Family: Once a week    Attends Religious Services: Never    Database Administrator or Organizations: No    Attends Engineer, Structural: Never    Marital Status: Not on file    Family History  Problem Relation Age of Onset   Cancer Mother        BONE   Depression Father    Diabetes Father    Depression Sister    Lung cancer Sister    Uterine cancer Sister    Depression Brother    Stomach cancer Maternal Grandmother    Stroke Other    GER disease Other    Colon cancer Neg Hx     Health Maintenance  Topic Date Due   Lung Cancer Screening  Never done   COVID-19 Vaccine (3 - 2024-25 season) 03/24/2023 (Originally 10/24/2022)   Zoster Vaccines- Shingrix (2 of 2) 06/06/2023 (Originally 12/18/2020)   MAMMOGRAM  10/11/2023 (Originally 06/20/2022)   Hepatitis C Screening  10/11/2023 (Originally 10/26/1977)   HIV Screening  10/11/2023 (Originally 10/27/1974)   Medicare Annual Wellness (AWV)  10/11/2023   Colonoscopy  12/26/2030   INFLUENZA VACCINE  Completed   Pneumococcal Vaccine 56-22 Years old  Aged Out   HPV VACCINES  Aged Out   DTaP/Tdap/Td  Discontinued     ----------------------------------------------------------------------------------------------------------------------------------------------------------------------------------------------------------------- Physical Exam BP (!) 143/91   Pulse 87   Ht 5' 4 (1.626 m)   Wt 166 lb 8 oz (75.5 kg)   LMP  (LMP Unknown)   SpO2 99%   BMI 28.58 kg/m   Physical Exam Constitutional:      Appearance: Normal appearance. She is obese.  Cardiovascular:     Rate and Rhythm: Normal rate and regular rhythm.  Pulmonary:     Effort: Pulmonary effort is  normal.     Breath sounds: Normal breath sounds.  Neurological:     General: No focal deficit present.     Mental Status: She is alert.  Psychiatric:        Mood and Affect: Mood normal.        Behavior: Behavior normal.     ------------------------------------------------------------------------------------------------------------------------------------------------------------------------------------------------------------------- Assessment and Plan  Morbid obesity (HCC) She has obesity with co-morbidty of chronic low back pain.  She also has elevated BP today.  Referral to dietician.  We discussed medications avaialable to weight management.  She will check with insurance to see if GLP-1 is covered.  Will add contrave .  Meds ordered this encounter  Medications   Naltrexone-buPROPion HCl ER (CONTRAVE ) 8-90 MG TB12    Sig: Start 1 tablet every morning for 7 days, then 1 tablet twice daily for 7 days, then 2 tablets every morning and one every evening    Dispense:  120 tablet    Refill:  0    No follow-ups on file.    This visit occurred during the SARS-CoV-2 public health emergency.  Safety protocols were in place, including screening questions prior to the visit, additional usage of staff PPE, and extensive cleaning of exam room while observing appropriate contact time as indicated for disinfecting solutions.

## 2023-03-08 NOTE — Addendum Note (Signed)
 Addended by: Carren Rang A on: 03/08/2023 04:12 PM   Modules accepted: Orders

## 2023-03-08 NOTE — Patient Instructions (Signed)
 Check to see if your insurance covers Zepbound or [facility] for weight loss.

## 2023-03-08 NOTE — Code Documentation (Signed)
 done

## 2023-03-10 NOTE — Discharge Instructions (Signed)

## 2023-03-11 ENCOUNTER — Telehealth: Payer: Self-pay

## 2023-03-11 ENCOUNTER — Ambulatory Visit
Admission: RE | Admit: 2023-03-11 | Discharge: 2023-03-11 | Disposition: A | Discharge status: Home / Self Care | Payer: Medicare PPO | Source: Ambulatory Visit | Attending: Sports Medicine | Admitting: Sports Medicine

## 2023-03-11 DIAGNOSIS — M4727 Other spondylosis with radiculopathy, lumbosacral region: Secondary | ICD-10-CM | POA: Diagnosis not present

## 2023-03-11 DIAGNOSIS — M5416 Radiculopathy, lumbar region: Secondary | ICD-10-CM

## 2023-03-11 MED ORDER — IOPAMIDOL (ISOVUE-M 200) INJECTION 41%
1.0000 mL | Freq: Once | INTRAMUSCULAR | Status: AC
Start: 2023-03-11 — End: 2023-03-11
  Administered 2023-03-11: 1 mL via EPIDURAL

## 2023-03-11 MED ORDER — METHYLPREDNISOLONE ACETATE 40 MG/ML INJ SUSP (RADIOLOG
80.0000 mg | Freq: Once | INTRAMUSCULAR | Status: AC
Start: 2023-03-11 — End: 2023-03-11
  Administered 2023-03-11: 80 mg via EPIDURAL

## 2023-03-11 NOTE — Telephone Encounter (Signed)
Left message for a return call

## 2023-03-11 NOTE — Telephone Encounter (Signed)
Copied from CRM [id number]. Topic: Clinical - Prescription Issue >> Mar 10, 2023 11:20 AM Nila Nephew wrote: Reason for CRM: Patient calling in to request that [facility] be prescribed and sent for pre-authorization.

## 2023-03-15 ENCOUNTER — Ambulatory Visit: Payer: Medicare PPO | Attending: Sports Medicine | Admitting: Physical Therapy

## 2023-03-15 ENCOUNTER — Encounter: Payer: Self-pay | Admitting: Family Medicine

## 2023-03-15 DIAGNOSIS — M1811 Unilateral primary osteoarthritis of first carpometacarpal joint, right hand: Secondary | ICD-10-CM | POA: Insufficient documentation

## 2023-03-15 DIAGNOSIS — R29898 Other symptoms and signs involving the musculoskeletal system: Secondary | ICD-10-CM | POA: Insufficient documentation

## 2023-03-15 DIAGNOSIS — M6281 Muscle weakness (generalized): Secondary | ICD-10-CM | POA: Insufficient documentation

## 2023-03-15 DIAGNOSIS — M5416 Radiculopathy, lumbar region: Secondary | ICD-10-CM | POA: Insufficient documentation

## 2023-03-16 NOTE — Telephone Encounter (Signed)
Attempted call to patient. Left a voice mail message requesting a return call.  

## 2023-03-17 ENCOUNTER — Telehealth: Payer: Self-pay

## 2023-03-17 NOTE — Telephone Encounter (Signed)
Copied from CRM [id number]. Topic: Clinical - Prescription Issue >> Mar 17, 2023 10:00 AM Shelah Lewandowsky wrote: Reason for CRM: Message for Cala Bradford, patient spoke with insurance and they stated Reginal Lutes would be covered with prior authorization, please call patient [phone]

## 2023-03-18 ENCOUNTER — Other Ambulatory Visit: Payer: Self-pay | Admitting: Family Medicine

## 2023-03-18 MED ORDER — SEMAGLUTIDE-WEIGHT MANAGEMENT 1 MG/0.5ML ~~LOC~~ SOAJ: 1.0000 mg | SUBCUTANEOUS | 0 refills | Status: DC

## 2023-03-18 MED ORDER — SEMAGLUTIDE-WEIGHT MANAGEMENT 0.5 MG/0.5ML ~~LOC~~ SOAJ: 0.5000 mg | SUBCUTANEOUS | 0 refills | Status: DC

## 2023-03-18 MED ORDER — SEMAGLUTIDE-WEIGHT MANAGEMENT 0.25 MG/0.5ML ~~LOC~~ SOAJ: 0.2500 mg | SUBCUTANEOUS | 0 refills | Status: DC

## 2023-03-19 ENCOUNTER — Other Ambulatory Visit: Payer: Self-pay | Admitting: Family Medicine

## 2023-03-21 ENCOUNTER — Other Ambulatory Visit: Payer: Medicare PPO

## 2023-03-21 ENCOUNTER — Ambulatory Visit: Payer: Self-pay | Admitting: Family Medicine

## 2023-03-21 NOTE — Telephone Encounter (Signed)
Copied from CRM [id number]. Topic: Clinical - Prescription Issue >> Mar 21, 2023  2:38 PM Prudencio Pair wrote: Reason for CRM: Patient calling in regards to her [facility] prescription. She states she was trying to see if the insurance company approved it or not. Please give patient a call back to advise. CB #: [phone].

## 2023-03-24 NOTE — Telephone Encounter (Addendum)
Initiated Prior authorization AOZ:HYQMVH 0.25MG /0.5ML auto-injectors Via: Covermymeds Case/Key:[id number] Status: Pending denial medication is non formulary  as of 03/24/23, no hx cardiac risk factors, or osa , pt had a stress test last year that resulted normal, no perinate evidence found  to support claim pt bmi is 28 Reason: Notified Pt via: Mychart

## 2023-03-30 NOTE — Telephone Encounter (Signed)
 Zackary Steva LABOR, CMA  Certified Medical Assistant Nursing   Telephone Encounter Addendum   Encounter Date: 03/21/2023    Initiated Prior authorization for:Wegovy  0.25MG /0.5ML auto-injectors Via: Covermymeds Case/Key:[id number] Status: Pending denial medication is non formulary  as of 03/24/23, no hx cardiac risk factors, or osa , pt had a stress test last year that resulted normal, no perinate evidence found  to support claim pt bmi is 28 Reason: Notified Pt via: Doctor, General Practice signed by Zackary Steva LABOR, CMA at 03/24/2023  2:48 PM Electronically signed by Zackary Steva LABOR, CMA at 03/24/2023  2:48 PM

## 2023-04-05 NOTE — Telephone Encounter (Addendum)
Process completed by another CMA.  Prior auth for: [facility]  Determination: DENIED - an appeal to change determination has been sent for reconsideration Auth #Marc Morgans Reason: Medicare part D does not cover this medication.  Patient notified via MyChart

## 2023-04-06 ENCOUNTER — Ambulatory Visit: Payer: Medicare PPO | Admitting: Sports Medicine

## 2023-04-07 ENCOUNTER — Ambulatory Visit (INDEPENDENT_AMBULATORY_CARE_PROVIDER_SITE_OTHER): Payer: Medicare PPO | Admitting: Sports Medicine

## 2023-04-07 ENCOUNTER — Other Ambulatory Visit (INDEPENDENT_AMBULATORY_CARE_PROVIDER_SITE_OTHER): Payer: Medicare PPO

## 2023-04-07 DIAGNOSIS — M1811 Unilateral primary osteoarthritis of first carpometacarpal joint, right hand: Secondary | ICD-10-CM

## 2023-04-07 DIAGNOSIS — M5416 Radiculopathy, lumbar region: Secondary | ICD-10-CM

## 2023-04-07 DIAGNOSIS — Z96642 Presence of left artificial hip joint: Secondary | ICD-10-CM | POA: Diagnosis not present

## 2023-04-07 DIAGNOSIS — M7062 Trochanteric bursitis, left hip: Secondary | ICD-10-CM | POA: Diagnosis not present

## 2023-04-07 MED ORDER — SEMAGLUTIDE (2 MG/DOSE) 8 MG/3ML ~~LOC~~ SOPN: PEN_INJECTOR | SUBCUTANEOUS | 3 refills | Status: DC

## 2023-04-07 NOTE — Progress Notes (Signed)
    Procedures performed today:    Procedure: Real-time Ultrasound Guided injection of the right first [facility] Device: Samsung HS60  Verbal informed consent obtained.  Time-out conducted.  Noted no overlying erythema, induration, or other signs of local infection.  Skin prepped in a sterile fashion.  Local anesthesia: Topical Ethyl chloride.  With sterile technique and under real time ultrasound guidance: Arthritic joint noted, 0.5 cc lidocaine, 0.5 cc kenalog 40 injected easily. Completed without difficulty  Advised to call if fevers/chills, erythema, induration, drainage, or persistent bleeding.  Images permanently stored and available for review in PACS.  Impression: Technically successful ultrasound guided injection.  Independent interpretation of notes and tests performed by another provider:   None.  Brief History, Exam, Impression, and Recommendations:    Greater trochanteric bursitis of left hip Amber Hanson continues to have chronic left lateral hip pain, clinically this represents trochanteric bursitis, she has had multiple injections, she has had over 6 weeks of physician directed physical therapy, she had x-rays done, for persistent pain we will proceed with MRI. 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 We injected it today after failure of conservative treatment. She is status post left [facility] arthroplasty.  Right lumbar radiculitis Please see prior notes, multifactorial low back pain, currently on Lyrica 200 mg 3 times daily. Clinically she has an L4 distribution radiculitis. MRI also showed profoundly arthritic facet joints. She has actually done well with a right L4-L5 transforaminal epidural so we will order epidural #2. Her facet injections were denied though I  would like to try to get approval again in the future should she start to fail her epidurals. She understands no more oxycodone. Return to see me in about 6 weeks.  Morbid obesity (HCC) Looks like Amber Hanson was denied. Adding generic compounded semaglutide [facility], she can follow this up with PCP.    ____________________________________________ Ihor Austin. Benjamin Stain, M.D., ABFM., CAQSM., AME. Primary Care and Sports Medicine [facility]  Adjunct Professor of Family Medicine  [employer]  Restaurant manager, fast food

## 2023-04-07 NOTE — Assessment & Plan Note (Signed)
Amber Hanson continues to have chronic left lateral hip pain, clinically this represents trochanteric bursitis, she has had multiple injections, she has had over 6 weeks of physician directed physical therapy, she had x-rays done, for persistent pain we will proceed with MRI. 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

## 2023-04-07 NOTE — Assessment & Plan Note (Signed)
Please see prior notes, multifactorial low back pain, currently on Lyrica 200 mg 3 times daily. Clinically she has an L4 distribution radiculitis. MRI also showed profoundly arthritic facet joints. She has actually done well with a right L4-L5 transforaminal epidural so we will order epidural #2. Her facet injections were denied though I would like to try to get approval again in the future should she start to fail her epidurals. She understands no more oxycodone. Return to see me in about 6 weeks.

## 2023-04-07 NOTE — Assessment & Plan Note (Signed)
Recurrence of right first CMC pain. We injected it today after failure of conservative treatment. She is status post left [facility] arthroplasty.

## 2023-04-07 NOTE — Assessment & Plan Note (Signed)
Looks like Wegovy was denied. Adding generic compounded semaglutide [facility], she can follow this up with PCP.

## 2023-04-11 ENCOUNTER — Ambulatory Visit (INDEPENDENT_AMBULATORY_CARE_PROVIDER_SITE_OTHER): Payer: Medicare PPO | Admitting: Family Medicine

## 2023-04-11 DIAGNOSIS — R635 Abnormal weight gain: Secondary | ICD-10-CM

## 2023-04-11 NOTE — Progress Notes (Signed)
 Medical screening examination/treatment was performed by qualified clinical staff member and as supervising physician I was immediately available for consultation/collaboration. I have reviewed documentation and agree with assessment and plan.  Everrett Coombe, DO

## 2023-04-11 NOTE — Progress Notes (Signed)
 HPI  Pt here today to learn how to draw up and inject her compounded semiglutide.                      Assessment and Plan:    Pt educated on how to inject her medication. She voiced her understanding

## 2023-04-15 ENCOUNTER — Encounter: Payer: Self-pay | Admitting: Family Medicine

## 2023-04-17 ENCOUNTER — Ambulatory Visit: Payer: Medicare PPO

## 2023-04-17 DIAGNOSIS — M7062 Trochanteric bursitis, left hip: Secondary | ICD-10-CM

## 2023-04-17 DIAGNOSIS — Z96642 Presence of left artificial hip joint: Secondary | ICD-10-CM | POA: Diagnosis not present

## 2023-04-17 DIAGNOSIS — M25552 Pain in left hip: Secondary | ICD-10-CM | POA: Diagnosis not present

## 2023-04-18 ENCOUNTER — Ambulatory Visit: Payer: Medicare PPO | Admitting: Sports Medicine

## 2023-04-18 ENCOUNTER — Encounter: Payer: Self-pay | Admitting: Sports Medicine

## 2023-04-20 NOTE — Discharge Instructions (Signed)

## 2023-04-21 ENCOUNTER — Ambulatory Visit
Admission: RE | Admit: 2023-04-21 | Discharge: 2023-04-21 | Disposition: A | Discharge status: Home / Self Care | Payer: Medicare PPO | Source: Ambulatory Visit | Attending: Sports Medicine | Admitting: Sports Medicine

## 2023-04-21 ENCOUNTER — Encounter: Payer: Self-pay | Admitting: Sports Medicine

## 2023-04-21 DIAGNOSIS — M5416 Radiculopathy, lumbar region: Secondary | ICD-10-CM

## 2023-04-21 DIAGNOSIS — M4727 Other spondylosis with radiculopathy, lumbosacral region: Secondary | ICD-10-CM | POA: Diagnosis not present

## 2023-04-21 MED ORDER — IOPAMIDOL (ISOVUE-M 200) INJECTION 41%
1.0000 mL | Freq: Once | INTRAMUSCULAR | Status: AC
  Administered 2023-04-21: 1 mL via EPIDURAL

## 2023-04-21 MED ORDER — METHYLPREDNISOLONE ACETATE 40 MG/ML INJ SUSP (RADIOLOG
80.0000 mg | Freq: Once | INTRAMUSCULAR | Status: AC
  Administered 2023-04-21: 80 mg via EPIDURAL

## 2023-04-22 NOTE — Telephone Encounter (Signed)
 I called [facility].

## 2023-05-10 ENCOUNTER — Ambulatory Visit: Payer: Self-pay | Admitting: Family Medicine

## 2023-05-10 NOTE — Telephone Encounter (Addendum)
 Chief Complaint: Back Pain Symptoms: left low back pain Frequency: worsening since yesterday Pertinent Negatives: Patient denies radiating pain, urinary symptoms Disposition: [] ED /[] Urgent Care (no appt availability in office) / [x] Appointment(In office/virtual)/ []  [place] [facility]/ [] Home Care/ [] Refused Recommended Disposition /[]  Mobile Bus/ []  Follow-up with PCP Additional Notes: Pt reports she has been experiencing worsening left side low back pain, denies urinary symptoms. Pain is 5/10, constant and worsening in intensity with movement. Pt denies numbness, tingling. OV scheduled. This RN educated pt on home care, new-worsening symptoms, when to call back/seek emergent care. Pt verbalized understanding and agrees to plan.    Copied from CRM [id number]. Topic: Clinical - Medication Question >> May 10, 2023 12:45 PM Higinio Roger wrote: Reason for CRM: Patient is requesting temporary pain medicine for her back until her appointment on 05/19/23. Patient states she is already applying heating pad to back.  Preferred Pharmacy:  [id number] - [place], [place] - [address]  [place] [address] Phone: [phone] Fax: [phone] Hours: Not open 24 hours  Callback #: [phone] Reason for Disposition  [1] MODERATE back pain (e.g., interferes with normal activities) AND [2] present > 3 days  Answer Assessment - Initial Assessment Questions 1. ONSET: "When did the pain begin?"      Worsening since last evening 2. LOCATION: "Where does it hurt?" (upper, mid or lower back)     Left side low back pain 3. SEVERITY: "How bad is the pain?"  (e.g., Scale 1-10; mild, moderate, or severe)   - MILD (1-3): Doesn't interfere with normal activities.    - MODERATE (4-7): Interferes with normal activities or awakens from sleep.    - SEVERE (8-10): Excruciating pain, unable to do any normal activities.      5/10 4. PATTERN: "Is the pain constant?" (e.g., yes,  no; constant, intermittent)      Constant, varies in intensity 5. RADIATION: "Does the pain shoot into your legs or somewhere else?"     None 6. CAUSE:  "What do you think is causing the back pain?"      Unknown 8. MEDICINES: "What have you taken so far for the pain?" (e.g., nothing, acetaminophen, NSAIDS)     Tylenol, minimal relief 9. NEUROLOGIC SYMPTOMS: "Do you have any weakness, numbness, or problems with bowel/bladder control?"     None 10. OTHER SYMPTOMS: "Do you have any other symptoms?" (e.g., fever, abdomen pain, burning with urination, blood in urine)       None  Protocols used: Back Pain-A-AH

## 2023-05-10 NOTE — Telephone Encounter (Signed)
 Copied from CRM [id number]. Topic: General - Other >> May 10, 2023  1:02 PM Nila Nephew wrote: Reason for CRM: Patient is calling to make an addendum to previous CRM. Patient states she wants to specify that is it the left side of her back that is bothering her. Specification does not overall change the request of previous CRM.

## 2023-05-11 ENCOUNTER — Encounter: Payer: Self-pay | Admitting: Family Medicine

## 2023-05-11 ENCOUNTER — Telehealth (INDEPENDENT_AMBULATORY_CARE_PROVIDER_SITE_OTHER): Admitting: Family Medicine

## 2023-05-11 VITALS — Ht 64.0 in | Wt 166.5 lb

## 2023-05-11 DIAGNOSIS — M545 Low back pain, unspecified: Secondary | ICD-10-CM | POA: Diagnosis not present

## 2023-05-11 MED ORDER — PREDNISONE 10 MG (21) PO TBPK: ORAL_TABLET | ORAL | 0 refills | Status: DC

## 2023-05-11 MED ORDER — METRONIDAZOLE 0.75 % EX GEL: 1.0000 | Freq: Two times a day (BID) | CUTANEOUS | 1 refills | Status: DC

## 2023-05-11 MED ORDER — CYCLOBENZAPRINE HCL 10 MG PO TABS: 10.0000 mg | ORAL_TABLET | Freq: Three times a day (TID) | ORAL | 0 refills | Status: DC | PRN

## 2023-05-11 NOTE — Progress Notes (Signed)
 Amber Hanson - 63 y.o. female MRN [id number]  Date of birth: 10-09-1960   This visit type was conducted due to national recommendations for restrictions regarding the COVID-19 Pandemic (e.g. social distancing).  This format is felt to be most appropriate for this patient at this time.  All issues noted in this document were discussed and addressed.  No physical exam was performed (except for noted visual exam findings with Video Visits).  I discussed the limitations of evaluation and management by telemedicine and the availability of in person appointments. The patient expressed understanding and agreed to proceed.  I connected withNAME@ on 05/11/23 at  1:10 PM EDT by a video enabled telemedicine application and verified that I am speaking with the correct person using two identifiers.  Present at visit: Everrett Coombe, DO [name]   Patient Location: Home [address] [place] [address]   Provider location:   [facility]  Chief Complaint  Patient presents with   Medical Management of Chronic Issues    HPI  Amber Hanson is a 63 y.o. female who presents via audio/video conferencing for a telehealth visit today.  She complains of increased back pain.  Pain in located on the L side which is different from her typical back pain which is usually on the R side.  Injections on the R side have helped quite a bit.  The pain on her L side does not radiate down the leg. Area feels "tight".  Denies numbness/tingling. No urinary symptoms. She gets some relief with ibuprofen.     ROS:  A comprehensive ROS was completed and negative except as noted per HPI  Past Medical History:  Diagnosis Date   Anxiety 03/08/2017   Arthritis 03/08/2017   Bipolar 1 disorder (HCC) 08/20/2015   Overview:  Dr. Chilton Si, [facility]. Now Dr Jordan Likes   Last Assessment & Plan:  Relevant Hx: Course: Daily Update: Today's Plan:   Chronic low back pain 07/05/2011   Overview:  Dr. Karie Schwalbe. [facility]    Gastroesophageal reflux disease without esophagitis 08/20/2015   Insomnia 03/05/2013   Migraine without status migrainosus, not intractable 09/19/2012   Postmenopausal HRT (hormone replacement therapy) 08/20/2015    Past Surgical History:  Procedure Laterality Date   CESAREAN SECTION     (2)    CHOLECYSTECTOMY     COLONOSCOPY  2007   in [place]- normal exam   KNEE SURGERY Bilateral    VAGINAL HYSTERECTOMY      Family History  Problem Relation Age of Onset   Cancer Mother        BONE   Depression Father    Diabetes Father    Depression Sister    Lung cancer Sister    Uterine cancer Sister    Depression Brother    Stomach cancer Maternal Grandmother    Stroke Other    GER disease Other    Colon cancer Neg Hx     Social History   Socioeconomic History   Marital status: Widowed    Spouse name: Not on file   Number of children: 2   Years of education: 14   Highest education level: Associate degree: academic program  Occupational History   Occupation: [employer]    Comment: Working part time   Occupation: disabled  Tobacco Use   Smoking status: Never   Smokeless tobacco: Never  Vaping Use   Vaping status: Never Used  Substance and Sexual Activity   Alcohol use: No   Drug use: No  Sexual activity: Not Currently    Partners: Male    Birth control/protection: None  Other Topics Concern   Not on file  Social History Narrative   Lives with a friend. She enjoys gardening and painting.   Social Drivers of Corporate investment banker Strain: Low Risk  (10/11/2022)   Overall Financial Resource Strain (CARDIA)    Difficulty of Paying Living Expenses: Not hard at all  Food Insecurity: No Food Insecurity (10/11/2022)   Hunger Vital Sign    Worried About Running Out of Food in the Last Year: Never true    Ran Out of Food in the Last Year: Never true  Transportation Needs: No Transportation Needs (10/11/2022)   PRAPARE - Administrator, Civil Service (Medical):  No    Lack of Transportation (Non-Medical): No  Physical Activity: Sufficiently Active (10/11/2022)   Exercise Vital Sign    Days of Exercise per Week: 3 days    Minutes of Exercise per Session: 60 min  Stress: Stress Concern Present (10/11/2022)   [employer] of Occupational Health - Occupational Stress Questionnaire    Feeling of Stress : To some extent  Social Connections: Unknown (10/11/2022)   Social Connection and Isolation Panel [NHANES]    Frequency of Communication with Friends and Family: Three times a week    Frequency of Social Gatherings with Friends and Family: Once a week    Attends Religious Services: Never    Database administrator or Organizations: No    Attends Banker Meetings: Never    Marital Status: Not on file  Intimate Partner Violence: Not At Risk (10/11/2022)   Humiliation, Afraid, Rape, and Kick questionnaire    Fear of Current or Ex-Partner: No    Emotionally Abused: No    Physically Abused: No    Sexually Abused: No     Current Outpatient Medications:    acetaminophen (TYLENOL) 650 MG CR tablet, Take 1 tablet (650 mg total) by mouth every 8 (eight) hours as needed for pain., Disp: 90 tablet, Rfl: 3   Azelaic Acid 15 % gel, Apply 1 Application topically 2 (two) times daily., Disp: 30 g, Rfl: 0   benzonatate (TESSALON) 200 MG capsule, Take 1 capsule (200 mg total) by mouth 2 (two) times daily as needed for cough., Disp: 30 capsule, Rfl: 0   busPIRone (BUSPAR) 15 MG tablet, TAKE 1 TABLET BY MOUTH TWICE A DAY, Disp: 180 tablet, Rfl: 0   celecoxib (CELEBREX) 200 MG capsule, TAKE 1 CAPSULE BY MOUTH TWICE A DAY, Disp: 180 capsule, Rfl: 0   cetirizine (ZYRTEC) 10 MG tablet, Take 1 tablet (10 mg total) by mouth daily. As needed, Disp: 90 tablet, Rfl: 1   diclofenac Sodium (VOLTAREN) 1 % GEL, Apply 2 g topically 4 (four) times daily., Disp: 100 g, Rfl: 3   DULoxetine (CYMBALTA) 60 MG capsule, TAKE 2 CAPSULES BY MOUTH EVERY DAY, Disp: 180 capsule,  Rfl: 0   erythromycin ophthalmic ointment, Apply 1g (pea sized amount) to eyelids three times daily for 10 days., Disp: 30 g, Rfl: 0   estradiol (ESTRACE VAGINAL) 0.1 MG/GM vaginal cream, Place 1 Applicatorful vaginally 3 (three) times a week., Disp: 42.5 g, Rfl: 12   estradiol (ESTRACE) 1 MG tablet, TAKE 1 TABLET BY MOUTH EVERY DAY, Disp: 90 tablet, Rfl: 0   ketoconazole (NIZORAL) 2 % cream, Apply 1 application topically 2 (two) times daily. To affected areas., Disp: 30 g, Rfl: 1   metroNIDAZOLE (METROGEL) 0.75 %  gel, Apply 1 Application topically 2 (two) times daily., Disp: 45 g, Rfl: 1   Naltrexone-buPROPion HCl ER (CONTRAVE) 8-90 MG TB12, Start 1 tablet every morning for 7 days, then 1 tablet twice daily for 7 days, then 2 tablets every morning and one every evening, Disp: 120 tablet, Rfl: 0   pantoprazole (PROTONIX) 20 MG tablet, TAKE 1 TABLET BY MOUTH EVERY DAY, Disp: 90 tablet, Rfl: 1   predniSONE (STERAPRED UNI-PAK 21 TAB) 10 MG (21) TBPK tablet, Taper as directed on packaging., Disp: 21 tablet, Rfl: 0   pregabalin (LYRICA) 200 MG capsule, Take 1 capsule (200 mg total) by mouth 3 (three) times daily as needed., Disp: 90 capsule, Rfl: 3   propranolol (INDERAL) 20 MG tablet, TAKE 1 TABLET TWICE DAILY, Disp: 180 tablet, Rfl: 1   QUEtiapine (SEROQUEL) 100 MG tablet, Take at night, Disp: 90 tablet, Rfl: 0   Semaglutide, 2 MG/DOSE, 8 MG/3ML SOPN, Semaglutide 2.5 mg/mL + Vit B6 10mg /mL.Inject 10u/0.19mL/0.25 mg subcu weekly x4 weeks then 20u/0.63mL/0.5 mg subcu weekly x4 weeks, then 40u/0.42mL/1 mg subcu weekly x4 weeks then 68u/0.69mL/1.7mg  subcu weekly x4 weeks then 100u/19mL/2.5mg  subcu weekly., Disp: 2 mL, Rfl: 3   valACYclovir (VALTREX) 1000 MG tablet, Take 1 tablet (1,000 mg total) by mouth 3 (three) times daily., Disp: 21 tablet, Rfl: 0   cyclobenzaprine (FLEXERIL) 10 MG tablet, Take 1 tablet (10 mg total) by mouth 3 (three) times daily as needed for muscle spasms., Disp: 30 tablet, Rfl:  0  EXAM:  VITALS per patient if applicable: Ht 5\' 4"  (1.626 m)   Wt 166 lb 8 oz (75.5 kg)   LMP  (LMP Unknown)   BMI 28.58 kg/m   GENERAL: alert, oriented, appears well and in no acute distress  HEENT: atraumatic, conjunttiva clear, no obvious abnormalities on inspection of external nose and ears  NECK: normal movements of the head and neck  LUNGS: on inspection no signs of respiratory distress, breathing rate appears normal, no obvious gross SOB, gasping or wheezing  CV: no obvious cyanosis  MS: moves all visible extremities without noticeable abnormality  PSYCH/NEURO: pleasant and cooperative, no obvious depression or anxiety, speech and thought processing grossly intact  ASSESSMENT AND PLAN:  Discussed the following assessment and plan:  Pain in left lumbar region of back Continue duloxetine and lyrica.  Adding prednisone taper and flexeril.  She will let me know if this is not effective for her.       I discussed the assessment and treatment plan with the patient. The patient was provided an opportunity to ask questions and all were answered. The patient agreed with the plan and demonstrated an understanding of the instructions.   The patient was advised to call back or seek an in-person evaluation if the symptoms worsen or if the condition fails to improve as anticipated.    Everrett Coombe, DO

## 2023-05-11 NOTE — Progress Notes (Signed)
 Left sided back pain. Pain was worse yesterday but has eased a bit today.

## 2023-05-11 NOTE — Assessment & Plan Note (Signed)
 Continue duloxetine and lyrica.  Adding prednisone taper and flexeril.  She will let me know if this is not effective for her.

## 2023-05-16 ENCOUNTER — Encounter (HOSPITAL_COMMUNITY): Payer: Self-pay | Admitting: Psychiatry

## 2023-05-16 ENCOUNTER — Encounter (HOSPITAL_COMMUNITY): Payer: Self-pay

## 2023-05-16 ENCOUNTER — Telehealth (HOSPITAL_COMMUNITY): Payer: Medicare PPO | Admitting: Psychiatry

## 2023-05-19 ENCOUNTER — Ambulatory Visit (INDEPENDENT_AMBULATORY_CARE_PROVIDER_SITE_OTHER): Payer: Medicare PPO | Admitting: Sports Medicine

## 2023-05-19 ENCOUNTER — Encounter: Payer: Self-pay | Admitting: Sports Medicine

## 2023-05-19 DIAGNOSIS — M5136 Other intervertebral disc degeneration, lumbar region with discogenic back pain only: Secondary | ICD-10-CM

## 2023-05-19 DIAGNOSIS — M7062 Trochanteric bursitis, left hip: Secondary | ICD-10-CM

## 2023-05-19 DIAGNOSIS — M1811 Unilateral primary osteoarthritis of first carpometacarpal joint, right hand: Secondary | ICD-10-CM | POA: Diagnosis not present

## 2023-05-19 NOTE — Assessment & Plan Note (Signed)
 Multilevel DDD, she has done extremely well with right L4-L5 transforaminal epidurals, she has had 2. Right side is doing well, she does have increasing discomfort on the left side, she has had a lot of steroid injections lately as well as oral steroids so we will hold off on any additional injections and lean mostly on her weight loss. At a follow-up if she continues to have discomfort we can proceed with a left L4-L5 transforaminal epidural

## 2023-05-19 NOTE — Assessment & Plan Note (Signed)
 MRI did confirm trochanteric bursitis with fluid in the bursa itself. She has had multiple injections and physical therapy. She understands that long-term hip abductor therapy and conditioning is crucial here. She has had a lot of steroid, oral and injections lately so we will hold off on any more steroids. She did get her weight loss injection, we will lean on weight loss for now rather than do additional steroids.

## 2023-05-19 NOTE — Assessment & Plan Note (Signed)
 Status post left [facility] arthroplasty. She does continue to have some discomfort on the right side, we did an injection back in February. Referral to hand surgery.

## 2023-05-19 NOTE — Progress Notes (Signed)
    Procedures performed today:    None.  Independent interpretation of notes and tests performed by another provider:   None.  Brief History, Exam, Impression, and Recommendations:    Greater trochanteric bursitis of left hip MRI did confirm trochanteric bursitis with fluid in the bursa itself. She has had multiple injections and physical therapy. She understands that long-term hip abductor therapy and conditioning is crucial here. She has had a lot of steroid, oral and injections lately so we will hold off on any more steroids. She did get her weight loss injection, we will lean on weight loss for now rather than do additional steroids.  Lumbar degenerative disc disease Multilevel DDD, she has done extremely well with right L4-L5 transforaminal epidurals, she has had 2. Right side is doing well, she does have increasing discomfort on the left side, she has had a lot of steroid injections lately as well as oral steroids so we will hold off on any additional injections and lean mostly on her weight loss. At a follow-up if she continues to have discomfort we can proceed with a left L4-L5 transforaminal epidural  Primary osteoarthritis of first carpometacarpal joint of one hand, right status post left [facility] arthroplasty Status post left [facility] arthroplasty. She does continue to have some discomfort on the right side, we did an injection back in February. Referral to hand surgery.    ____________________________________________ Ihor Austin. Benjamin Stain, M.D., ABFM., CAQSM., AME. Primary Care and Sports Medicine [facility] [address] [facility]  Adjunct Professor of Family Medicine  [employer]  Restaurant manager, fast food

## 2023-06-08 ENCOUNTER — Ambulatory Visit (INDEPENDENT_AMBULATORY_CARE_PROVIDER_SITE_OTHER): Admitting: Family Medicine

## 2023-06-08 ENCOUNTER — Encounter: Payer: Self-pay | Admitting: Family Medicine

## 2023-06-08 VITALS — BP 118/75 | HR 87 | Ht 64.0 in | Wt 166.0 lb

## 2023-06-08 DIAGNOSIS — F3181 Bipolar II disorder: Secondary | ICD-10-CM

## 2023-06-08 DIAGNOSIS — Z780 Asymptomatic menopausal state: Secondary | ICD-10-CM

## 2023-06-08 DIAGNOSIS — F419 Anxiety disorder, unspecified: Secondary | ICD-10-CM

## 2023-06-08 DIAGNOSIS — Z79890 Hormone replacement therapy (postmenopausal): Secondary | ICD-10-CM | POA: Diagnosis not present

## 2023-06-08 DIAGNOSIS — L719 Rosacea, unspecified: Secondary | ICD-10-CM

## 2023-06-08 DIAGNOSIS — F319 Bipolar disorder, unspecified: Secondary | ICD-10-CM

## 2023-06-08 DIAGNOSIS — F4310 Post-traumatic stress disorder, unspecified: Secondary | ICD-10-CM | POA: Diagnosis not present

## 2023-06-08 MED ORDER — BUSPIRONE HCL 15 MG PO TABS: 15.0000 mg | ORAL_TABLET | Freq: Two times a day (BID) | ORAL | 0 refills | Status: AC

## 2023-06-08 MED ORDER — QUETIAPINE FUMARATE 100 MG PO TABS: ORAL_TABLET | ORAL | 0 refills | Status: DC

## 2023-06-08 MED ORDER — DULOXETINE HCL 60 MG PO CPEP: 120.0000 mg | ORAL_CAPSULE | Freq: Every day | ORAL | 0 refills | Status: DC

## 2023-06-08 MED ORDER — METRONIDAZOLE 0.75 % EX GEL: 1.0000 | Freq: Two times a day (BID) | CUTANEOUS | 1 refills | Status: AC

## 2023-06-08 NOTE — Assessment & Plan Note (Signed)
**Note De-Identified Snellings Obfuscation** Metrogel renewed

## 2023-06-08 NOTE — Assessment & Plan Note (Signed)
 Referral placed to new psychiatrist.   Renewals of medications sent in until established

## 2023-06-08 NOTE — Progress Notes (Signed)
 Amber Hanson - 63 y.o. female MRN [id number]  Date of birth: [birth year]  Subjective Chief Complaint  Patient presents with   Medication Refill   Rosacea    HPI Amber Hanson is a 63 y.o. female here today for follow up.   She needs refills on metronidazole gel for treatment of rosacea.  She feels that this is more effective than erythromycin ointment.    She was also dismissed from psychiatry for missing too many appointments.  She is asking for updated referral to new psychiatrist and requesting that I renew medication until she is re-established.  She is stable with current medications.   ROS:  A comprehensive ROS was completed and negative except as noted per HPI  Allergies  Allergen Reactions   Sulfa Antibiotics Anaphylaxis   Sulfasalazine Anaphylaxis and Swelling    Past Medical History:  Diagnosis Date   Anxiety 03/08/2017   Arthritis 03/08/2017   Bipolar 1 disorder (HCC) 08/20/2015   Overview:  Dr. Marrie Sizer, [facility]. Now Dr Adonis Alamin   Last Assessment & Plan:  Relevant Hx: Course: Daily Update: Today's Plan:   Chronic low back pain 07/05/2011   Overview:  Dr. Elva Hamburger. [facility]   Gastroesophageal reflux disease without esophagitis 08/20/2015   Insomnia 03/05/2013   Migraine without status migrainosus, not intractable 09/19/2012   Postmenopausal HRT (hormone replacement therapy) 08/20/2015    Past Surgical History:  Procedure Laterality Date   CESAREAN SECTION     (2)    CHOLECYSTECTOMY     COLONOSCOPY  2007   in [place]- normal exam   KNEE SURGERY Bilateral    VAGINAL HYSTERECTOMY      Social History   Socioeconomic History   Marital status: Widowed    Spouse name: Not on file   Number of children: 2   Years of education: 14   Highest education level: Associate degree: academic program  Occupational History   Occupation: [employer]    Comment: Working part time   Occupation: disabled  Tobacco Use   Smoking status: Never   Smokeless tobacco: Never   Vaping Use   Vaping status: Never Used  Substance and Sexual Activity   Alcohol use: No   Drug use: No   Sexual activity: Not Currently    Partners: Male    Birth control/protection: None  Other Topics Concern   Not on file  Social History Narrative   Lives with a friend. She enjoys gardening and painting.   Social Drivers of Corporate investment banker Strain: Low Risk  (10/11/2022)   Overall Financial Resource Strain (CARDIA)    Difficulty of Paying Living Expenses: Not hard at all  Food Insecurity: No Food Insecurity (10/11/2022)   Hunger Vital Sign    Worried About Running Out of Food in the Last Year: Never true    Ran Out of Food in the Last Year: Never true  Transportation Needs: No Transportation Needs (10/11/2022)   PRAPARE - Administrator, Civil Service (Medical): No    Lack of Transportation (Non-Medical): No  Physical Activity: Sufficiently Active (10/11/2022)   Exercise Vital Sign    Days of Exercise per Week: 3 days    Minutes of Exercise per Session: 60 min  Stress: Stress Concern Present (10/11/2022)   [employer] of Occupational Health - Occupational Stress Questionnaire    Feeling of Stress : To some extent  Social Connections: Unknown (10/11/2022)   Social Connection and Isolation Panel [NHANES]    Frequency of Communication  with Friends and Family: Three times a week    Frequency of Social Gatherings with Friends and Family: Once a week    Attends Religious Services: Never    Database administrator or Organizations: No    Attends Engineer, structural: Never    Marital Status: Not on file    Family History  Problem Relation Age of Onset   Cancer Mother        BONE   Depression Father    Diabetes Father    Depression Sister    Lung cancer Sister    Uterine cancer Sister    Depression Brother    Stomach cancer Maternal Grandmother    Stroke Other    GER disease Other    Colon cancer Neg Hx     Health Maintenance   Topic Date Due   Lung Cancer Screening  Never done   Zoster Vaccines- Shingrix (2 of 2) 12/18/2020   COVID-19 Vaccine (3 - 2024-25 season) 10/24/2022   MAMMOGRAM  10/11/2023 (Originally 06/20/2022)   Hepatitis C Screening  10/11/2023 (Originally 10/26/1977)   HIV Screening  10/11/2023 (Originally 10/27/1974)   INFLUENZA VACCINE  09/23/2023   Medicare Annual Wellness (AWV)  10/11/2023   Colonoscopy  12/26/2030   Pneumococcal Vaccine 56-78 Years old  Aged Out   HPV VACCINES  Aged Out   Meningococcal B Vaccine  Aged Out   DTaP/Tdap/Td  Discontinued     ----------------------------------------------------------------------------------------------------------------------------------------------------------------------------------------------------------------- Physical Exam BP 118/75 (BP Location: Left Arm, Patient Position: Sitting, Cuff Size: Normal)   Pulse 87   Ht 5\' 4"  (1.626 m)   Wt 166 lb (75.3 kg)   LMP  (LMP Unknown)   SpO2 98%   BMI 28.49 kg/m   Physical Exam Constitutional:      Appearance: Normal appearance.  Eyes:     General: No scleral icterus. Cardiovascular:     Rate and Rhythm: Normal rate and regular rhythm.  Pulmonary:     Effort: Pulmonary effort is normal.     Breath sounds: Normal breath sounds.  Neurological:     Mental Status: She is alert.  Psychiatric:        Mood and Affect: Mood normal.        Behavior: Behavior normal.     ------------------------------------------------------------------------------------------------------------------------------------------------------------------------------------------------------------------- Assessment and Plan  Bipolar 1 disorder [facility]) Referral placed to new psychiatrist.   Renewals of medications sent in until established   Rosacea Metrogel renewed.    Meds ordered this encounter  Medications   DULoxetine (CYMBALTA) 60 MG capsule    Sig: Take 2 capsules (120 mg total) by mouth daily.     Dispense:  180 capsule    Refill:  0   busPIRone (BUSPAR) 15 MG tablet    Sig: Take 1 tablet (15 mg total) by mouth 2 (two) times daily.    Dispense:  180 tablet    Refill:  0   QUEtiapine (SEROQUEL) 100 MG tablet    Sig: Take at night    Dispense:  90 tablet    Refill:  0   metroNIDAZOLE (METROGEL) 0.75 % gel    Sig: Apply 1 Application topically 2 (two) times daily.    Dispense:  45 g    Refill:  1    No follow-ups on file.

## 2023-06-08 NOTE — Patient Instructions (Signed)
 B complex for skin and nails Arnica to help with bruising

## 2023-06-13 ENCOUNTER — Encounter: Payer: Self-pay | Admitting: Sports Medicine

## 2023-06-16 ENCOUNTER — Telehealth: Payer: Self-pay | Admitting: Family Medicine

## 2023-06-16 NOTE — Telephone Encounter (Signed)
 Copied from CRM [id number]. Topic: Clinical - Medication Question >> Jun 16, 2023  3:34 PM Amber Hanson wrote: Reason for CRM: [facility]  prescription for Semaglutide , 2 MG/DOSE, 8 MG/3ML to a new name. She thinks slim is in the new name. She needs a new prescription written

## 2023-06-17 ENCOUNTER — Other Ambulatory Visit: Payer: Self-pay | Admitting: Sports Medicine

## 2023-06-17 MED ORDER — TIRZEPATIDE 10 MG/0.5ML ~~LOC~~ SOAJ: SUBCUTANEOUS | 11 refills | Status: AC

## 2023-07-05 ENCOUNTER — Other Ambulatory Visit: Payer: Self-pay | Admitting: Family Medicine

## 2023-07-05 DIAGNOSIS — Z79890 Hormone replacement therapy (postmenopausal): Secondary | ICD-10-CM

## 2023-07-05 DIAGNOSIS — Z780 Asymptomatic menopausal state: Secondary | ICD-10-CM

## 2023-07-08 ENCOUNTER — Other Ambulatory Visit: Payer: Self-pay | Admitting: Family Medicine

## 2023-07-08 DIAGNOSIS — K219 Gastro-esophageal reflux disease without esophagitis: Secondary | ICD-10-CM

## 2023-07-12 ENCOUNTER — Ambulatory Visit (INDEPENDENT_AMBULATORY_CARE_PROVIDER_SITE_OTHER): Admitting: Sports Medicine

## 2023-07-12 ENCOUNTER — Other Ambulatory Visit (INDEPENDENT_AMBULATORY_CARE_PROVIDER_SITE_OTHER)

## 2023-07-12 ENCOUNTER — Encounter: Payer: Self-pay | Admitting: Sports Medicine

## 2023-07-12 DIAGNOSIS — M7062 Trochanteric bursitis, left hip: Secondary | ICD-10-CM | POA: Diagnosis not present

## 2023-07-12 DIAGNOSIS — M79641 Pain in right hand: Secondary | ICD-10-CM

## 2023-07-12 DIAGNOSIS — M7542 Impingement syndrome of left shoulder: Secondary | ICD-10-CM | POA: Diagnosis not present

## 2023-07-12 MED ORDER — TRIAMCINOLONE ACETONIDE 40 MG/ML IJ SUSP
40.0000 mg | Freq: Once | INTRAMUSCULAR | Status: AC
Start: 2023-07-12 — End: 2023-07-12
  Administered 2023-07-12: 40 mg via INTRAMUSCULAR

## 2023-07-12 NOTE — Assessment & Plan Note (Signed)
 Amber Hanson also has pain at the thumb basal joint, she does have a typical squared off carpometacarpal joint. She tells me she has had injections in the past that have only been minimally efficacious, reinjected her right first [facility] in February and she really did not get much relief, she is going to be asking for hand surgery referral, she will look up the surgeon and let me know who she would like to use.

## 2023-07-12 NOTE — Assessment & Plan Note (Signed)
 MRI does confirm greater trochanteric bursitis with fluid in the bursa itself, she has had several injections, physical therapy, she knows that long-term hip abductor therapy and conditioning is crucial, she has not had an injection in some time, she is having increasing pain so we did a greater trochanteric bursa/gluteus medius tendon to the injection today. Return to see me in 6 weeks.

## 2023-07-12 NOTE — Progress Notes (Signed)
    Procedures performed today:    Procedure: Real-time Ultrasound Guided injection of the left greater trochanteric bursa/gluteus medius tendon sheath Device: Samsung [id number]  Verbal informed consent obtained.  Time-out conducted.  Noted no overlying erythema, induration, or other signs of local infection.  Skin prepped in a sterile fashion.  Local anesthesia: Topical Ethyl chloride.  With sterile technique and under real time ultrasound guidance: 1 cc Kenalog  40, 2 cc lidocaine, 2 cc bupivacaine injected easily Completed without difficulty  Advised to call if fevers/chills, erythema, induration, drainage, or persistent bleeding.  Images permanently stored and available for review in PACS.  Impression: Technically successful ultrasound guided injection.  Independent interpretation of notes and tests performed by another provider:   None.  Brief History, Exam, Impression, and Recommendations:    Greater trochanteric bursitis of left hip MRI does confirm greater trochanteric bursitis with fluid in the bursa itself, she has had several injections, physical therapy, she knows that long-term hip abductor therapy and conditioning is crucial, she has not had an injection in some time, she is having increasing pain so we did a greater trochanteric bursa/gluteus medius tendon to the injection today. Return to see me in 6 weeks.  Right hand pain Carrieanne also has pain at the thumb basal joint, she does have a typical squared off carpometacarpal joint. She tells me she has had injections in the past that have only been minimally efficacious, reinjected her right first [facility] in February and she really did not get much relief, she is going to be asking for hand surgery referral, she will look up the surgeon and let me know who she would like to use.    ____________________________________________ Joselyn Nicely. Sandy Crumb, M.D., ABFM., CAQSM., AME. Primary Care and Sports Medicine  [facility]  [facility]  Adjunct Professor of [facility]  [employer]  Restaurant manager, fast food

## 2023-07-14 DIAGNOSIS — F3181 Bipolar II disorder: Secondary | ICD-10-CM | POA: Diagnosis not present

## 2023-07-14 DIAGNOSIS — F411 Generalized anxiety disorder: Secondary | ICD-10-CM | POA: Diagnosis not present

## 2023-07-25 DIAGNOSIS — G8929 Other chronic pain: Secondary | ICD-10-CM | POA: Diagnosis not present

## 2023-07-25 DIAGNOSIS — M79644 Pain in right finger(s): Secondary | ICD-10-CM | POA: Diagnosis not present

## 2023-07-25 DIAGNOSIS — M1811 Unilateral primary osteoarthritis of first carpometacarpal joint, right hand: Secondary | ICD-10-CM | POA: Diagnosis not present

## 2023-07-25 DIAGNOSIS — M19041 Primary osteoarthritis, right hand: Secondary | ICD-10-CM | POA: Diagnosis not present

## 2023-08-25 ENCOUNTER — Other Ambulatory Visit: Payer: Self-pay | Admitting: Family Medicine

## 2023-08-25 DIAGNOSIS — F319 Bipolar disorder, unspecified: Secondary | ICD-10-CM

## 2023-08-25 DIAGNOSIS — F419 Anxiety disorder, unspecified: Secondary | ICD-10-CM

## 2023-09-09 ENCOUNTER — Other Ambulatory Visit: Payer: Self-pay | Admitting: Family Medicine

## 2023-09-12 ENCOUNTER — Ambulatory Visit

## 2023-09-12 ENCOUNTER — Ambulatory Visit (INDEPENDENT_AMBULATORY_CARE_PROVIDER_SITE_OTHER): Admitting: Sports Medicine

## 2023-09-12 ENCOUNTER — Other Ambulatory Visit: Payer: Self-pay | Admitting: Family Medicine

## 2023-09-12 DIAGNOSIS — M7542 Impingement syndrome of left shoulder: Secondary | ICD-10-CM

## 2023-09-12 DIAGNOSIS — R222 Localized swelling, mass and lump, trunk: Secondary | ICD-10-CM

## 2023-09-12 DIAGNOSIS — R1905 Periumbilic swelling, mass or lump: Secondary | ICD-10-CM | POA: Diagnosis not present

## 2023-09-12 DIAGNOSIS — M25511 Pain in right shoulder: Secondary | ICD-10-CM

## 2023-09-12 DIAGNOSIS — M5136 Other intervertebral disc degeneration, lumbar region with discogenic back pain only: Secondary | ICD-10-CM

## 2023-09-12 DIAGNOSIS — M19011 Primary osteoarthritis, right shoulder: Secondary | ICD-10-CM | POA: Diagnosis not present

## 2023-09-12 DIAGNOSIS — G8929 Other chronic pain: Secondary | ICD-10-CM | POA: Diagnosis not present

## 2023-09-12 DIAGNOSIS — M19012 Primary osteoarthritis, left shoulder: Secondary | ICD-10-CM | POA: Diagnosis not present

## 2023-09-12 DIAGNOSIS — M25512 Pain in left shoulder: Secondary | ICD-10-CM

## 2023-09-12 DIAGNOSIS — M7062 Trochanteric bursitis, left hip: Secondary | ICD-10-CM | POA: Diagnosis not present

## 2023-09-12 DIAGNOSIS — Z96611 Presence of right artificial shoulder joint: Secondary | ICD-10-CM | POA: Diagnosis not present

## 2023-09-12 NOTE — Assessment & Plan Note (Signed)
 Amber Hanson has shoulder pain again, left shoulder over the deltoid worse with abduction, positive impingement signs. We last talked about this over a year ago. We did a subacromial injection and she did well. She is also having some pain right shoulder which is status post arthroplasty. Will get bilateral shoulder x-rays, I like her to do some physical therapy again before considering subacromial injection of the left shoulder. I do want Dr. Hendrick to weigh in again regarding her right shoulder.

## 2023-09-12 NOTE — Progress Notes (Signed)
    Procedures performed today:    None.  Independent interpretation of notes and tests performed by another provider:   None.  Brief History, Exam, Impression, and Recommendations:    Impingement syndrome, shoulder, left status post right shoulder arthroplasty Amber Hanson has shoulder pain again, left shoulder over the deltoid worse with abduction, positive impingement signs. We last talked about this over a year ago. We did a subacromial injection and she did well. She is also having some pain right shoulder which is status post arthroplasty. Will get bilateral shoulder x-rays, I like her to do some physical therapy again before considering subacromial injection of the left shoulder. I do want Dr. Hendrick to weigh in again regarding her right shoulder.  Greater trochanteric bursitis of left hip Resolved after trochanteric bursa injection at her last visit.  Mass of anterior abdominal wall Minimally symptomatic mass anterior abdominal wall to the left of the umbilicus, well-defined, suspect lipoma versus sebaceous cyst, we will get an ultrasound.  Lumbar degenerative disc disease Multilevel lumbar and cervical DDD, she has done well with right L4-L5 transforaminal epidurals in the past. She was also on Lyrica  200 mg, she has self discontinued Lyrica  with some worsening of pain, she would like to hold off on any neuropathic agent including gabapentin  for now. She can do some analgesics over-the-counter and we can consider restarting with either epidurals versus neuropathic agents at her follow-up if not doing better.    ____________________________________________ Amber Hanson PARAS. Curtis, M.D., ABFM., CAQSM., AME. Primary Care and Sports Medicine [facility]  Adjunct Professor of [facility] Medicine  [employer]  Restaurant manager, fast food

## 2023-09-12 NOTE — Assessment & Plan Note (Signed)
 Minimally symptomatic mass anterior abdominal wall to the left of the umbilicus, well-defined, suspect lipoma versus sebaceous cyst, we will get an ultrasound.

## 2023-09-12 NOTE — Assessment & Plan Note (Signed)
 Multilevel lumbar and cervical DDD, she has done well with right L4-L5 transforaminal epidurals in the past. She was also on Lyrica  200 mg, she has self discontinued Lyrica  with some worsening of pain, she would like to hold off on any neuropathic agent including gabapentin  for now. She can do some analgesics over-the-counter and we can consider restarting with either epidurals versus neuropathic agents at her follow-up if not doing better.

## 2023-09-12 NOTE — Assessment & Plan Note (Signed)
 Resolved after trochanteric bursa injection at her last visit.

## 2023-09-18 ENCOUNTER — Ambulatory Visit: Payer: Self-pay | Admitting: Sports Medicine

## 2023-09-22 DIAGNOSIS — F411 Generalized anxiety disorder: Secondary | ICD-10-CM | POA: Diagnosis not present

## 2023-09-22 DIAGNOSIS — F3181 Bipolar II disorder: Secondary | ICD-10-CM | POA: Diagnosis not present

## 2023-09-28 DIAGNOSIS — Z96611 Presence of right artificial shoulder joint: Secondary | ICD-10-CM | POA: Diagnosis not present

## 2023-09-28 DIAGNOSIS — M25511 Pain in right shoulder: Secondary | ICD-10-CM | POA: Diagnosis not present

## 2023-10-08 ENCOUNTER — Other Ambulatory Visit: Payer: Self-pay | Admitting: Family Medicine

## 2023-10-08 DIAGNOSIS — Z79890 Hormone replacement therapy (postmenopausal): Secondary | ICD-10-CM

## 2023-10-08 DIAGNOSIS — Z780 Asymptomatic menopausal state: Secondary | ICD-10-CM

## 2023-10-10 DIAGNOSIS — M1811 Unilateral primary osteoarthritis of first carpometacarpal joint, right hand: Secondary | ICD-10-CM | POA: Diagnosis not present

## 2023-10-11 DIAGNOSIS — Z96611 Presence of right artificial shoulder joint: Secondary | ICD-10-CM | POA: Diagnosis not present

## 2023-10-11 DIAGNOSIS — S46811A Strain of other muscles, fascia and tendons at shoulder and upper arm level, right arm, initial encounter: Secondary | ICD-10-CM | POA: Diagnosis not present

## 2023-10-18 DIAGNOSIS — Z96611 Presence of right artificial shoulder joint: Secondary | ICD-10-CM | POA: Diagnosis not present

## 2023-10-18 DIAGNOSIS — M5090 Cervical disc disorder, unspecified, unspecified cervical region: Secondary | ICD-10-CM | POA: Diagnosis not present

## 2023-10-18 DIAGNOSIS — M75121 Complete rotator cuff tear or rupture of right shoulder, not specified as traumatic: Secondary | ICD-10-CM | POA: Diagnosis not present

## 2023-10-18 DIAGNOSIS — M5412 Radiculopathy, cervical region: Secondary | ICD-10-CM | POA: Diagnosis not present

## 2023-10-20 ENCOUNTER — Encounter

## 2023-10-25 ENCOUNTER — Encounter: Payer: Self-pay | Admitting: Sports Medicine

## 2023-10-25 ENCOUNTER — Ambulatory Visit: Admitting: Sports Medicine

## 2023-10-26 DIAGNOSIS — M4722 Other spondylosis with radiculopathy, cervical region: Secondary | ICD-10-CM | POA: Diagnosis not present

## 2023-10-26 DIAGNOSIS — M50123 Cervical disc disorder at C6-C7 level with radiculopathy: Secondary | ICD-10-CM | POA: Diagnosis not present

## 2023-10-26 DIAGNOSIS — M4723 Other spondylosis with radiculopathy, cervicothoracic region: Secondary | ICD-10-CM | POA: Diagnosis not present

## 2023-10-26 DIAGNOSIS — M2578 Osteophyte, vertebrae: Secondary | ICD-10-CM | POA: Diagnosis not present

## 2023-10-26 DIAGNOSIS — M50122 Cervical disc disorder at C5-C6 level with radiculopathy: Secondary | ICD-10-CM | POA: Diagnosis not present

## 2023-10-26 DIAGNOSIS — M4803 Spinal stenosis, cervicothoracic region: Secondary | ICD-10-CM | POA: Diagnosis not present

## 2023-10-26 DIAGNOSIS — M5013 Cervical disc disorder with radiculopathy, cervicothoracic region: Secondary | ICD-10-CM | POA: Diagnosis not present

## 2023-10-26 DIAGNOSIS — M50121 Cervical disc disorder at C4-C5 level with radiculopathy: Secondary | ICD-10-CM | POA: Diagnosis not present

## 2023-10-26 DIAGNOSIS — M4802 Spinal stenosis, cervical region: Secondary | ICD-10-CM | POA: Diagnosis not present

## 2023-11-02 DIAGNOSIS — M47812 Spondylosis without myelopathy or radiculopathy, cervical region: Secondary | ICD-10-CM | POA: Diagnosis not present

## 2023-11-02 DIAGNOSIS — G894 Chronic pain syndrome: Secondary | ICD-10-CM | POA: Diagnosis not present

## 2023-11-02 DIAGNOSIS — M4722 Other spondylosis with radiculopathy, cervical region: Secondary | ICD-10-CM | POA: Diagnosis not present

## 2023-11-02 DIAGNOSIS — M5030 Other cervical disc degeneration, unspecified cervical region: Secondary | ICD-10-CM | POA: Diagnosis not present

## 2023-11-08 ENCOUNTER — Other Ambulatory Visit: Payer: Self-pay | Admitting: Family Medicine

## 2023-11-08 DIAGNOSIS — K219 Gastro-esophageal reflux disease without esophagitis: Secondary | ICD-10-CM

## 2023-11-23 DIAGNOSIS — M4722 Other spondylosis with radiculopathy, cervical region: Secondary | ICD-10-CM | POA: Diagnosis not present

## 2023-11-23 DIAGNOSIS — M4802 Spinal stenosis, cervical region: Secondary | ICD-10-CM | POA: Diagnosis not present

## 2023-11-23 DIAGNOSIS — M5030 Other cervical disc degeneration, unspecified cervical region: Secondary | ICD-10-CM | POA: Diagnosis not present

## 2023-12-07 ENCOUNTER — Other Ambulatory Visit: Payer: Self-pay | Admitting: Family Medicine

## 2023-12-21 DIAGNOSIS — M1811 Unilateral primary osteoarthritis of first carpometacarpal joint, right hand: Secondary | ICD-10-CM | POA: Diagnosis not present

## 2023-12-21 DIAGNOSIS — Z96611 Presence of right artificial shoulder joint: Secondary | ICD-10-CM | POA: Diagnosis not present

## 2023-12-21 DIAGNOSIS — M25511 Pain in right shoulder: Secondary | ICD-10-CM | POA: Diagnosis not present

## 2023-12-21 DIAGNOSIS — M5090 Cervical disc disorder, unspecified, unspecified cervical region: Secondary | ICD-10-CM | POA: Diagnosis not present

## 2023-12-23 DIAGNOSIS — Z96611 Presence of right artificial shoulder joint: Secondary | ICD-10-CM | POA: Diagnosis not present

## 2023-12-23 DIAGNOSIS — G8929 Other chronic pain: Secondary | ICD-10-CM | POA: Diagnosis not present

## 2023-12-23 DIAGNOSIS — M25511 Pain in right shoulder: Secondary | ICD-10-CM | POA: Diagnosis not present

## 2023-12-26 ENCOUNTER — Encounter: Payer: Self-pay | Admitting: Family Medicine

## 2023-12-26 ENCOUNTER — Telehealth: Admitting: Family Medicine

## 2023-12-26 VITALS — Ht 64.0 in | Wt 166.0 lb

## 2023-12-26 DIAGNOSIS — R21 Rash and other nonspecific skin eruption: Secondary | ICD-10-CM | POA: Diagnosis not present

## 2023-12-26 DIAGNOSIS — Z780 Asymptomatic menopausal state: Secondary | ICD-10-CM | POA: Diagnosis not present

## 2023-12-26 DIAGNOSIS — Z79890 Hormone replacement therapy (postmenopausal): Secondary | ICD-10-CM

## 2023-12-26 MED ORDER — CLOTRIMAZOLE-BETAMETHASONE 1-0.05 % EX CREA: 1.0000 | TOPICAL_CREAM | Freq: Every day | CUTANEOUS | 0 refills | Status: AC

## 2023-12-26 MED ORDER — ESTRADIOL 1 MG PO TABS: 1.0000 mg | ORAL_TABLET | Freq: Every day | ORAL | 1 refills | Status: AC

## 2023-12-26 MED ORDER — TRIAMCINOLONE ACETONIDE 0.025 % EX CREA: 1.0000 | TOPICAL_CREAM | Freq: Two times a day (BID) | CUTANEOUS | 0 refills | Status: AC

## 2023-12-26 NOTE — Progress Notes (Signed)
 Amber Hanson - 63 y.o. female MRN [id number]  Date of birth: 1960/06/14   All issues noted in this document were discussed and addressed.  No physical exam was performed (except for noted visual exam findings with Video Visits).  I discussed the limitations of evaluation and management by telemedicine and the availability of in person appointments. The patient expressed understanding and agreed to proceed.  I connected withNAME@ on 12/26/23 at  3:10 PM EST by a video enabled telemedicine application and verified that I am speaking with the correct person using two identifiers.  Present at visit: Amber Ku, DO Amber Hanson   Patient Location: Home [address] [place] [address]   Provider location:   [facility]  Chief Complaint  Patient presents with   Rash    HPI  Amber Hanson is a 63 y.o. female who presents via audio/video conferencing for a telehealth visit today.  She reports rash that appeared on neck about a week ago.  Rash is scaly and itchy.  Some redness around the area without pain.  Having some flaky skin and itching around the eye and eyelids. Denies redness around the eyes. She is concerned about ring worm.    ROS:  A comprehensive ROS was completed and negative except as noted per HPI  Past Medical History:  Diagnosis Date   Anxiety 03/08/2017   Arthritis 03/08/2017   Bipolar 1 disorder (HCC) 08/20/2015   Overview:  Dr. Landy, [facility]. Now Dr Louis   Last Assessment & Plan:  Relevant Hx: Course: Daily Update: Today's Plan:   Chronic low back pain 07/05/2011   Overview:  Dr. ONEIDA. Spangler, Ortho   Gastroesophageal reflux disease without esophagitis 08/20/2015   Insomnia 03/05/2013   Migraine without status migrainosus, not intractable 09/19/2012   Postmenopausal HRT (hormone replacement therapy) 08/20/2015    Past Surgical History:  Procedure Laterality Date   CESAREAN SECTION     (2)    CHOLECYSTECTOMY     COLONOSCOPY  2007   in  [place]- normal exam   KNEE SURGERY Bilateral    VAGINAL HYSTERECTOMY      Family History  Problem Relation Age of Onset   Cancer Mother        BONE   Depression Father    Diabetes Father    Depression Sister    Lung cancer Sister    Uterine cancer Sister    Depression Brother    Stomach cancer Maternal Grandmother    Stroke Other    GER disease Other    Colon cancer Neg Hx     Social History   Socioeconomic History   Marital status: Widowed    Spouse name: Not on file   Number of children: 2   Years of education: 14   Highest education level: Associate degree: academic program  Occupational History   Occupation: [employer]    Comment: Working part time   Occupation: disabled  Tobacco Use   Smoking status: Never   Smokeless tobacco: Never  Vaping Use   Vaping status: Never Used  Substance and Sexual Activity   Alcohol use: No   Drug use: No   Sexual activity: Not Currently    Partners: Male    Birth control/protection: None  Other Topics Concern   Not on file  Social History Narrative   Lives with a friend. She enjoys gardening and painting.   Social Drivers of Health   Financial Resource Strain: Low Risk  (10/11/2022)   Overall Financial  Resource Strain (CARDIA)    Difficulty of Paying Living Expenses: Not hard at all  Food Insecurity: No Food Insecurity (10/11/2022)   Hunger Vital Sign    Worried About Running Out of Food in the Last Year: Never true    Ran Out of Food in the Last Year: Never true  Transportation Needs: No Transportation Needs (10/11/2022)   PRAPARE - Administrator, Civil Service (Medical): No    Lack of Transportation (Non-Medical): No  Physical Activity: Sufficiently Active (10/11/2022)   Exercise Vital Sign    Days of Exercise per Week: 3 days    Minutes of Exercise per Session: 60 min  Stress: Stress Concern Present (10/11/2022)   [employer] of Occupational Health - Occupational Stress Questionnaire    Feeling of  Stress : To some extent  Social Connections: Unknown (10/11/2022)   Social Connection and Isolation Panel    Frequency of Communication with Friends and Family: Three times a week    Frequency of Social Gatherings with Friends and Family: Once a week    Attends Religious Services: Never    Database Administrator or Organizations: No    Attends Banker Meetings: Never    Marital Status: Not on file  Intimate Partner Violence: Not At Risk (10/11/2022)   Humiliation, Afraid, Rape, and Kick questionnaire    Fear of Current or Ex-Partner: No    Emotionally Abused: No    Physically Abused: No    Sexually Abused: No     Current Outpatient Medications:    acetaminophen  (TYLENOL ) 650 MG CR tablet, Take 1 tablet (650 mg total) by mouth every 8 (eight) hours as needed for pain., Disp: 90 tablet, Rfl: 3   Azelaic Acid  15 % gel, Apply 1 Application topically 2 (two) times daily., Disp: 30 g, Rfl: 0   busPIRone  (BUSPAR ) 15 MG tablet, Take 1 tablet (15 mg total) by mouth 2 (two) times daily., Disp: 180 tablet, Rfl: 0   cetirizine  (ZYRTEC ) 10 MG tablet, Take 1 tablet (10 mg total) by mouth daily. As needed, Disp: 90 tablet, Rfl: 1   clotrimazole-betamethasone  (LOTRISONE) cream, Apply 1 Application topically daily. Apply to area on neck., Disp: 30 g, Rfl: 0   cyclobenzaprine  (FLEXERIL ) 10 MG tablet, Take 1 tablet (10 mg total) by mouth 3 (three) times daily as needed for muscle spasms., Disp: 30 tablet, Rfl: 0   diclofenac  Sodium (VOLTAREN ) 1 % GEL, Apply 2 g topically 4 (four) times daily., Disp: 100 g, Rfl: 3   DULoxetine  (CYMBALTA ) 60 MG capsule, TAKE 2 CAPSULES BY MOUTH DAILY, Disp: 180 capsule, Rfl: 0   erythromycin  ophthalmic ointment, Apply 1g (pea sized amount) to eyelids three times daily for 10 days., Disp: 30 g, Rfl: 0   estradiol  (ESTRACE  VAGINAL) 0.1 MG/GM vaginal cream, Place 1 Applicatorful vaginally 3 (three) times a week., Disp: 42.5 g, Rfl: 12   estradiol  (ESTRACE ) 1 MG  tablet, TAKE 1 TABLET BY MOUTH EVERY DAY, Disp: 90 tablet, Rfl: 0   ketoconazole  (NIZORAL ) 2 % cream, Apply 1 application topically 2 (two) times daily. To affected areas., Disp: 30 g, Rfl: 1   metroNIDAZOLE  (METROGEL ) 0.75 % gel, Apply 1 Application topically 2 (two) times daily., Disp: 45 g, Rfl: 1   pantoprazole  (PROTONIX ) 20 MG tablet, TAKE 1 TABLET BY MOUTH EVERY DAY, Disp: 90 tablet, Rfl: 1   propranolol  (INDERAL ) 20 MG tablet, TAKE 1 TABLET TWICE DAILY, Disp: 180 tablet, Rfl: 1   QUEtiapine  (SEROQUEL )  100 MG tablet, TAKE 1 TABLET BY MOUTH EVERY DAY AT NIGHT, Disp: 90 tablet, Rfl: 0   tirzepatide  (MOUNJARO ) 10 MG/0.5ML Pen, Liposlim.  Tirzepatide /Pyridoxine/Thiamine /L-Carnitine 10mg /mL.  Inject 2.5 mg/25 units subcu weekly for 4 weeks then 5 mg/50 units subcu weekly for 4 weeks then 7.5 mg/75 units subcu weekly for 4 weeks then 10 mg/100 units subcu weekly for 4 weeks then 15 mg/150 units subcu weekly, Disp: 3 mL, Rfl: 11   triamcinolone  (KENALOG ) 0.025 % cream, Apply 1 Application topically 2 (two) times daily. For use around eyes.  Do not apply to eyelids or get into eyes., Disp: 30 g, Rfl: 0   valACYclovir  (VALTREX ) 1000 MG tablet, Take 1 tablet (1,000 mg total) by mouth 3 (three) times daily., Disp: 21 tablet, Rfl: 0  EXAM:  VITALS per patient if applicable: Ht 5' 4 (1.626 m)   Wt 166 lb (75.3 kg)   LMP  (LMP Unknown)   BMI 28.49 kg/m   GENERAL: alert, oriented, appears well and in no acute distress  HEENT: atraumatic, conjunttiva clear, no obvious abnormalities on inspection of external nose and ears  NECK: normal movements of the head and neck  LUNGS: on inspection no signs of respiratory distress, breathing rate appears normal, no obvious gross SOB, gasping or wheezing  CV: no obvious cyanosis  MS: moves all visible extremities without noticeable abnormality  PSYCH/NEURO: pleasant and cooperative, no obvious depression or anxiety, speech and thought processing grossly  intact  Skin: Scaly, irregularly shaped oblong rash along the L neck area  ASSESSMENT AND PLAN:  Discussed the following assessment and plan:  Rash Rash appears more eczematous that fungal in nature.  Will try lotrisone to provide coverage for both. Low strength triamcinolone  around the eyes as needed.       I discussed the assessment and treatment plan with the patient. The patient was provided an opportunity to ask questions and all were answered. The patient agreed with the plan and demonstrated an understanding of the instructions.   The patient was advised to call back or seek an in-person evaluation if the symptoms worsen or if the condition fails to improve as anticipated.    Amber Ku, DO

## 2023-12-26 NOTE — Assessment & Plan Note (Signed)
 Rash appears more eczematous that fungal in nature.  Will try lotrisone to provide coverage for both. Low strength triamcinolone  around the eyes as needed.

## 2023-12-30 DIAGNOSIS — Z96611 Presence of right artificial shoulder joint: Secondary | ICD-10-CM | POA: Diagnosis not present

## 2024-01-11 DIAGNOSIS — F3181 Bipolar II disorder: Secondary | ICD-10-CM | POA: Diagnosis not present

## 2024-01-11 DIAGNOSIS — F411 Generalized anxiety disorder: Secondary | ICD-10-CM | POA: Diagnosis not present

## 2024-01-12 DIAGNOSIS — Z01818 Encounter for other preprocedural examination: Secondary | ICD-10-CM | POA: Diagnosis not present

## 2024-01-12 DIAGNOSIS — F419 Anxiety disorder, unspecified: Secondary | ICD-10-CM | POA: Diagnosis not present

## 2024-01-12 DIAGNOSIS — K219 Gastro-esophageal reflux disease without esophagitis: Secondary | ICD-10-CM | POA: Diagnosis not present

## 2024-01-12 DIAGNOSIS — F319 Bipolar disorder, unspecified: Secondary | ICD-10-CM | POA: Diagnosis not present

## 2024-01-12 DIAGNOSIS — N951 Menopausal and female climacteric states: Secondary | ICD-10-CM | POA: Diagnosis not present

## 2024-01-18 DIAGNOSIS — Z96611 Presence of right artificial shoulder joint: Secondary | ICD-10-CM | POA: Diagnosis not present

## 2024-01-31 DIAGNOSIS — M25511 Pain in right shoulder: Secondary | ICD-10-CM | POA: Diagnosis not present

## 2024-01-31 DIAGNOSIS — S0993XA Unspecified injury of face, initial encounter: Secondary | ICD-10-CM | POA: Diagnosis not present

## 2024-01-31 DIAGNOSIS — Z96611 Presence of right artificial shoulder joint: Secondary | ICD-10-CM | POA: Diagnosis not present

## 2024-01-31 DIAGNOSIS — Z9889 Other specified postprocedural states: Secondary | ICD-10-CM | POA: Diagnosis not present

## 2024-02-24 ENCOUNTER — Ambulatory Visit: Admitting: Family Medicine

## 2024-02-24 ENCOUNTER — Encounter: Payer: Self-pay | Admitting: Family Medicine

## 2024-02-24 VITALS — BP 135/87 | HR 73 | Ht 64.0 in | Wt 154.0 lb

## 2024-02-24 DIAGNOSIS — Z1231 Encounter for screening mammogram for malignant neoplasm of breast: Secondary | ICD-10-CM | POA: Diagnosis not present

## 2024-02-24 DIAGNOSIS — M5136 Other intervertebral disc degeneration, lumbar region with discogenic back pain only: Secondary | ICD-10-CM | POA: Diagnosis not present

## 2024-02-24 DIAGNOSIS — M79641 Pain in right hand: Secondary | ICD-10-CM

## 2024-02-24 DIAGNOSIS — M5412 Radiculopathy, cervical region: Secondary | ICD-10-CM

## 2024-02-24 DIAGNOSIS — M25552 Pain in left hip: Secondary | ICD-10-CM | POA: Diagnosis not present

## 2024-02-24 MED ORDER — METHOCARBAMOL 500 MG PO TABS: 500.0000 mg | ORAL_TABLET | Freq: Three times a day (TID) | ORAL | 0 refills | Status: AC | PRN

## 2024-02-24 NOTE — Patient Instructions (Signed)
 Try Methocarbamol every 8 hours as needed for spasm.  Heat on the area may help some.  You may continue tylenol  1000mg  every 8 hours as needed for now.  Let me know if not getting better.

## 2024-02-24 NOTE — Progress Notes (Signed)
 " Amber Hanson - 64 y.o. female MRN [id number]  Date of birth: 1960/03/10  Subjective Chief Complaint  Patient presents with   Hospitalization Follow-up    fall   Hand Pain    bilateral   Hip Pain    Left    HPI Amber Hanson is a 64 y.o. female here today for follow up of recent ED visit.   She is about 3 weeks post op for shoulder replacement.  Had mechanical fall walking out of a store while shopping shortly after having surgery.  Landed on face and shoulder. Imaging in the ED did not show any acutre fractures or significant changes to hardware post-operatively   She has followed up with her orthopedic doctor. Still having some pain and stiffness in the neck.  Denies radiation, numbness or tingling.  She has been seeing IR for ESI in her lower back.   Would like to establish with a new sports medicine provider to get back on track with these as well as have injection in her hip.  ROS:  A comprehensive ROS was completed and negative except as noted per HPI  Allergies[1]  Past Medical History:  Diagnosis Date   Allergy    Anxiety 03/08/2017   Arthritis 03/08/2017   Bipolar 1 disorder (HCC) 08/20/2015   Overview:  Dr. Landy, [facility]. Now Dr Louis   Last Assessment & Plan:  Relevant Hx: Course: Daily Update: Today's Plan:   Chronic low back pain 07/05/2011   Overview:  Dr. ONEIDA. Spangler, Ortho   Depression    Gastroesophageal reflux disease without esophagitis 08/20/2015   Insomnia 03/05/2013   Migraine without status migrainosus, not intractable 09/19/2012   Neuromuscular disorder (HCC)    Postmenopausal HRT (hormone replacement therapy) 08/20/2015    Past Surgical History:  Procedure Laterality Date   CESAREAN SECTION     (2)    CHOLECYSTECTOMY     COLONOSCOPY  2007   in [place]- normal exam   JOINT REPLACEMENT     KNEE SURGERY Bilateral    VAGINAL HYSTERECTOMY      Social History   Socioeconomic History   Marital status: Widowed    Spouse name: Not  on file   Number of children: 2   Years of education: 14   Highest education level: Associate degree: academic program  Occupational History   Occupation: [employer]    Comment: Working part time   Occupation: disabled  Tobacco Use   Smoking status: Never   Smokeless tobacco: Never  Vaping Use   Vaping status: Never Used  Substance and Sexual Activity   Alcohol use: Not Currently   Drug use: Never   Sexual activity: Yes    Partners: Male    Birth control/protection: Surgical, Other-see comments, None  Other Topics Concern   Not on file  Social History Narrative   Lives with a friend. She enjoys gardening and painting.   Social Drivers of Health   Tobacco Use: Low Risk (02/26/2024)   Patient History    Smoking Tobacco Use: Never    Smokeless Tobacco Use: Never    Passive Exposure: Not on file  Financial Resource Strain: Medium Risk (01/14/2024)   Received from [facility]   Overall Financial Resource Strain (CARDIA)    How hard is it for you to pay for the very basics like food, housing, medical care, and heating?: Somewhat hard  Food Insecurity: No Food Insecurity (01/26/2024)   Received from [facility]  Within the past 12 months, you worried that your food would run out before you got the money to buy more.: Never true    Within the past 12 months, the food you bought just didn't last and you didn't have money to get more.: Never true  Transportation Needs: No Transportation Needs (01/26/2024)   Received from [facility]    In the past 12 months, has lack of transportation kept you from medical appointments or from getting medications?: No    In the past 12 months, has lack of transportation kept you from meetings, work, or from getting things needed for daily living?: No  Physical Activity: Inactive (01/14/2024)   Received from [facility]   Exercise Vital Sign    On average, how many days per week do you engage in moderate to strenuous exercise (like a  brisk walk)?: 0 days    Minutes of Exercise per Session: Not on file  Stress: No Stress Concern Present (01/26/2024)   Received from [facility] of Occupational Health - Occupational Stress Questionnaire    Do you feel stress - tense, restless, nervous, or anxious, or unable to sleep at night because your mind is troubled all the time - these days?: Not at all  Social Connections: Moderately Integrated (01/14/2024)   Received from [facility]   Social Network    How would you rate your social network (family, work, friends)?: Adequate participation with social networks  Depression (PHQ2-9): Medium Risk (02/24/2024)   Depression (PHQ2-9)    PHQ-2 Score: 8  Alcohol Screen: Low Risk (10/11/2022)   Alcohol Screen    Last Alcohol Screening Score (AUDIT): 1  Housing: Low Risk (01/26/2024)   Received from [facility]    In the last 12 months, was there a time when you were not able to pay the mortgage or rent on time?: No    In the past 12 months, how many times have you moved where you were living?: 0    At any time in the past 12 months, were you homeless or living in a shelter (including now)?: No  Utilities: Not At Risk (01/26/2024)   Received from [facility]    In the past 12 months has the electric, gas, oil, or water company threatened to shut off services in your home?: No  Health Literacy: Adequate Health Literacy (10/11/2022)   B1300 Health Literacy    Frequency of need for help with medical instructions: Never    Family History  Problem Relation Age of Onset   Cancer Mother        BONE   Depression Father    Diabetes Father    Anxiety disorder Father    Depression Sister    Lung cancer Sister    Uterine cancer Sister    Depression Brother    Stomach cancer Maternal Grandmother    Stroke Other    GER disease Other    Colon cancer Neg Hx     Health Maintenance  Topic Date Due   HIV Screening  Never done   Hepatitis C Screening   Never done   Lung Cancer Screening  Never done   Zoster Vaccines- Shingrix (2 of 2) 12/18/2020   Pneumococcal Vaccine: 50+ Years (2 of 2 - PCV) 09/19/2021   Mammogram  06/20/2022   Medicare Annual Wellness (AWV)  10/11/2023   COVID-19 Vaccine (3 - 2025-26 season) 10/24/2023   Colonoscopy  12/26/2030   Influenza Vaccine  Completed   Hepatitis B Vaccines 19-59 Average Risk  Completed   HPV VACCINES  Aged Out   Meningococcal B Vaccine  Aged Out   DTaP/Tdap/Td  Discontinued     ----------------------------------------------------------------------------------------------------------------------------------------------------------------------------------------------------------------- Physical Exam BP 135/87   Pulse 73   Ht 5' 4 (1.626 m)   Wt 154 lb (69.9 kg)   LMP  (LMP Unknown)   SpO2 98%   BMI 26.43 kg/m   Physical Exam Constitutional:      Appearance: Normal appearance.  HENT:     Head: Normocephalic and atraumatic.  Eyes:     General: No scleral icterus. Cardiovascular:     Rate and Rhythm: Normal rate and regular rhythm.  Pulmonary:     Effort: Pulmonary effort is normal.     Breath sounds: Normal breath sounds.  Musculoskeletal:     Cervical back: Neck supple.  Neurological:     Mental Status: She is alert.  Psychiatric:        Mood and Affect: Mood normal.        Behavior: Behavior normal.     ------------------------------------------------------------------------------------------------------------------------------------------------------------------------------------------------------------------- Assessment and Plan  Right cervical radiculopathy Recently exacerbated by fall.  Negative imaging in ED.  Adding Robaxin  as needed.  Lumbar degenerative disc disease She did well with transforaminal epidurals in the past.  Would like to get restarted back on these.  Referring back to sports medicine.   Meds ordered this encounter  Medications    methocarbamol  (ROBAXIN ) 500 MG tablet    Sig: Take 1 tablet (500 mg total) by mouth every 8 (eight) hours as needed for muscle spasms.    Dispense:  90 tablet    Refill:  0    No follow-ups on file.         [1]  Allergies Allergen Reactions   Sulfa Antibiotics Anaphylaxis   Sulfasalazine Anaphylaxis and Swelling   "

## 2024-02-26 ENCOUNTER — Encounter: Payer: Self-pay | Admitting: Family Medicine

## 2024-02-26 DIAGNOSIS — M25552 Pain in left hip: Secondary | ICD-10-CM | POA: Insufficient documentation

## 2024-02-26 NOTE — Assessment & Plan Note (Signed)
 She did well with transforaminal epidurals in the past.  Would like to get restarted back on these.  Referring back to sports medicine.

## 2024-02-26 NOTE — Assessment & Plan Note (Addendum)
 Recently exacerbated by fall.  Negative imaging in ED.  Adding Robaxin  as needed.

## 2024-03-01 ENCOUNTER — Ambulatory Visit (INDEPENDENT_AMBULATORY_CARE_PROVIDER_SITE_OTHER)

## 2024-03-01 ENCOUNTER — Other Ambulatory Visit: Payer: Self-pay

## 2024-03-01 VITALS — BP 120/82 | Ht 64.0 in | Wt 154.0 lb

## 2024-03-01 DIAGNOSIS — M25552 Pain in left hip: Secondary | ICD-10-CM | POA: Diagnosis not present

## 2024-03-01 DIAGNOSIS — M47812 Spondylosis without myelopathy or radiculopathy, cervical region: Secondary | ICD-10-CM

## 2024-03-01 MED ORDER — METHYLPREDNISOLONE ACETATE 40 MG/ML IJ SUSP
40.0000 mg | Freq: Once | INTRAMUSCULAR | Status: AC
  Administered 2024-03-01: 20 mg via INTRA_ARTICULAR

## 2024-03-01 NOTE — Progress Notes (Signed)
 "  Subjective:    Patient ID: Rock Sink, female    DOB: 64 y.o., [birth year]   MRN: [id number]  Chief Complaint: Fall, recent total shoulder arthroplasty.  Discussed the use of AI scribe software for clinical note transcription with the patient, who gave verbal consent to proceed.  History of Present Illness Patient is a 64 year old female with past medical history significant for migraine, GERD, tar dive dyskinesia, bipolar 2, anxiety, who recently underwent right shoulder hemiarthroplasty revision to reverse total shoulder arthroplasty on 12//2025.  She reports that she fell 10 days postoperatively after this.  Imaging findings which are reviewed below obtained from an emergency department visit related to this fall appear negative for fracture.  More germane to today's visit, the patient also complains of pain in her left hip which she has been receiving intermittent injections under the care of Dr. Curtis for years.  She reports she carries a diagnosis of hip bursitis on that side.  Typically gets 4-6 months of relief from those injections but the pain has continued to come back and plague her over the course of many years.  No numbness or tingling in the leg.  No known inciting injury.  Last injection she believes was the spring 2025.  Specifically requesting a repeat injection today.   Review of Pertinent Imaging: 01/31/24 emergency department visits at [facility]. CT SPINE CERVICAL  No acute fracture or traumatic malalignment identified.  Mild anterolisthesis of C3 on C4 and C4 on C5, likely degenerative in etiology. Vertebral body heights are intact.  Moderate multilevel spondylosis.  No paraspinal hematoma identified. Impression: IMPRESSION:  CT HEAD  1. No acute intracranial abnormality identified. 2. No calvarial fracture identified.  CT FACIAL BONES  1. No acute facial fracture identified.  2. Mild right mandibular condyle anterior lateral subluxation, likely degenerative.  Correlate with point tenderness. 3. Mild right facial edema and swelling.   CT SPINE CERVICAL  1. No acute fracture or traumatic malalignment identified.  2. No paraspinal hematoma identified.     XR CHEST, XR SHOULDER RIGHT  No acute cardiopulmonary disease identified. Right shoulder arthroplasty. No acute right shoulder fracture or traumatic malalignment.    Objective:   Vitals:   03/01/24 1427  BP: 120/82   Right shoulder: Flexion to ~70 degrees actively and ~125 passively  Left hip: Tender palpation of the greater trochanter. Nontender to palpation of the ASIS, AIIS, SI joint. Negative logroll, negative axial load.  Equivocal FADIR.  Positive FABER (localizes to the lateral hip)  Left gluteus medius Tendon Dextrose (15%) Prolotherapy Injection with Ultrasound Guidance Procedure Note Arsenia Goracke 02-17-1961 Indications: Pain Procedure Details Following the description of risks including infection, bleeding, and damage to surrounding structures patient provided written consent for left gluteus medius tendon Dextrose Prolotherapy injection with ultrasound guidance. Ultrasound was used to identify the left gluteus medius tendon. The patient was then prepped in the usual fashion with chlorhexidine. Following topical anesthetization with ethyl chloride, the patient was injected into the left gluteus medius tendon with a solution of 3.6ml of Dextrose 25%, 1.9ml of Mepivacaine 2%, 1ml of Sodium Bicarbonate 4.2%, and 10mg  of Depo-Medrol . This was well visualized under ultrasound, please see associated photographic documentation. Patient tolerated well without complication. Precautions provided. Cleaned and dressing applied.     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  After discussion of the  myriad of treatment options  available for greater trochanteric pain syndrome as well as the risks and benefits of each, we have ultimately elected to avoid further steroid injections as this has likely weakened her tendon over the years with repeat exposure leading to likely further partial tearing in the area.  We discussed treatments which may be more in her interest such as physical therapy, prolotherapy, and extracorporeal shockwave therapy.  Due to cost she prefers to avoid extracorporeal shockwave therapy and formal physical therapy.  I will provide her with a printout of home exercises she can engage in for treating this and we will initiate prolotherapy today with plan to follow-up in 2 weeks for injection #2 in a 3 injection series.  As a separate issue, she also reports to me that her orthopedic surgeon Dr. Hendrick specifically referred her to myself to receive epidural steroid injections.  After discussing with the patient that I do not provide these, the patient requested that I send her to [facility].  We will go ahead and place a referral there now.   "

## 2024-03-13 NOTE — Discharge Instructions (Signed)

## 2024-03-14 ENCOUNTER — Inpatient Hospital Stay: Admission: RE | Admit: 2024-03-14 | Discharge: 2024-03-14 | Disposition: A | Source: Ambulatory Visit

## 2024-03-14 DIAGNOSIS — M47812 Spondylosis without myelopathy or radiculopathy, cervical region: Secondary | ICD-10-CM

## 2024-03-14 MED ORDER — IOPAMIDOL (ISOVUE-M 300) INJECTION 61%
1.0000 mL | Freq: Once | INTRAMUSCULAR | Status: AC | PRN
  Administered 2024-03-14: 1 mL via EPIDURAL

## 2024-03-14 MED ORDER — TRIAMCINOLONE ACETONIDE 40 MG/ML IJ SUSP (RADIOLOGY)
60.0000 mg | Freq: Once | INTRAMUSCULAR | Status: AC
  Administered 2024-03-14: 60 mg via EPIDURAL

## 2024-03-15 ENCOUNTER — Ambulatory Visit

## 2024-03-15 ENCOUNTER — Other Ambulatory Visit: Payer: Self-pay

## 2024-03-15 VITALS — BP 120/82 | Ht 64.0 in | Wt 154.0 lb

## 2024-03-15 DIAGNOSIS — M25552 Pain in left hip: Secondary | ICD-10-CM

## 2024-03-15 MED ORDER — METHYLPREDNISOLONE ACETATE 40 MG/ML IJ SUSP
40.0000 mg | Freq: Once | INTRAMUSCULAR | Status: AC
  Administered 2024-03-15: 10 mg via INTRA_ARTICULAR

## 2024-03-15 NOTE — Progress Notes (Signed)
" ° °  Subjective:    Patient ID: Rock Sink, female    DOB: 64 y.o., [birth year]   MRN: [id number]  Chief Complaint: Left greater trochanteric pain syndrome - prolotherapy #2  Discussed the use of AI scribe software for clinical note transcription with the patient, who gave verbal consent to proceed.  History of Present Illness Patient reporting significant improvement following prolotherapy injection #1.     Objective:   Vitals:   03/15/24 1420  BP: 120/82   Left gluteus medius Tendon Dextrose (15%) Prolotherapy Injection with Ultrasound Guidance Procedure Note Khara Renaud May 11, 1960 Indications: Pain Procedure Details Following the description of risks including infection, bleeding, and damage to surrounding structures patient provided written consent for left gluteus medius tendon Dextrose Prolotherapy injection with ultrasound guidance. Ultrasound was used to identify the left gluteus medius tendon. The patient was then prepped in the usual fashion with chlorhexidine. Following topical anesthetization with ethyl chloride, the patient was injected into the left gluteus medius tendon with a solution of 3.6ml of Dextrose 25%, 1.9ml of Mepivacaine 2%, 1ml of Sodium Bicarbonate 4.2%, and 10mg  of Depo-Medrol . This was well visualized under ultrasound, please see associated photographic documentation. Patient tolerated well without complication. Precautions provided. Cleaned and dressing applied.     Assessment & Plan:   Assessment & Plan Patient tolerated procedure well.  Provided with home exercises specifically targeting gluteal tendons.  Follow-up in 2 weeks for prolotherapy injection #3.   "

## 2024-03-29 ENCOUNTER — Ambulatory Visit

## 2024-04-10 ENCOUNTER — Ambulatory Visit
